# Patient Record
Sex: Male | Born: 1970 | Race: Black or African American | Hispanic: No | Marital: Single | Smoking: Never smoker
Health system: Southern US, Community
[De-identification: ages and names within clinical notes are randomized; demographics above are authoritative.]

## PROBLEM LIST (undated history)

## (undated) DIAGNOSIS — Z789 Other specified health status: Secondary | ICD-10-CM

## (undated) DIAGNOSIS — K219 Gastro-esophageal reflux disease without esophagitis: Secondary | ICD-10-CM

## (undated) DIAGNOSIS — I10 Essential (primary) hypertension: Secondary | ICD-10-CM

## (undated) DIAGNOSIS — I2510 Atherosclerotic heart disease of native coronary artery without angina pectoris: Secondary | ICD-10-CM

## (undated) HISTORY — PX: CIRCUMCISION: SUR203

---

## 2004-09-13 ENCOUNTER — Emergency Department: Payer: Self-pay | Admitting: Emergency Medicine

## 2004-09-20 ENCOUNTER — Emergency Department: Payer: Self-pay | Admitting: Emergency Medicine

## 2004-09-20 ENCOUNTER — Other Ambulatory Visit: Payer: Self-pay

## 2005-05-30 ENCOUNTER — Emergency Department: Payer: Self-pay | Admitting: Emergency Medicine

## 2007-12-18 ENCOUNTER — Emergency Department: Payer: Self-pay | Admitting: Emergency Medicine

## 2009-04-14 ENCOUNTER — Emergency Department: Payer: Self-pay | Admitting: Emergency Medicine

## 2011-07-12 ENCOUNTER — Other Ambulatory Visit: Payer: Self-pay | Admitting: Orthopedic Surgery

## 2011-07-13 ENCOUNTER — Encounter (HOSPITAL_BASED_OUTPATIENT_CLINIC_OR_DEPARTMENT_OTHER): Payer: Self-pay | Admitting: *Deleted

## 2011-07-13 NOTE — Progress Notes (Signed)
No meds No bp or resp problems

## 2011-07-17 ENCOUNTER — Ambulatory Visit (HOSPITAL_BASED_OUTPATIENT_CLINIC_OR_DEPARTMENT_OTHER): Payer: Private Health Insurance - Indemnity | Admitting: Anesthesiology

## 2011-07-17 ENCOUNTER — Encounter (HOSPITAL_BASED_OUTPATIENT_CLINIC_OR_DEPARTMENT_OTHER): Payer: Self-pay | Admitting: Anesthesiology

## 2011-07-17 ENCOUNTER — Ambulatory Visit (HOSPITAL_BASED_OUTPATIENT_CLINIC_OR_DEPARTMENT_OTHER)
Admission: RE | Admit: 2011-07-17 | Discharge: 2011-07-18 | Disposition: A | Discharge status: Home / Self Care | Payer: Private Health Insurance - Indemnity | Source: Ambulatory Visit | Attending: Orthopedic Surgery | Admitting: Orthopedic Surgery

## 2011-07-17 ENCOUNTER — Encounter (HOSPITAL_BASED_OUTPATIENT_CLINIC_OR_DEPARTMENT_OTHER)
Admission: RE | Disposition: A | Discharge status: Home / Self Care | Payer: Self-pay | Source: Ambulatory Visit | Attending: Orthopedic Surgery

## 2011-07-17 ENCOUNTER — Encounter (HOSPITAL_BASED_OUTPATIENT_CLINIC_OR_DEPARTMENT_OTHER): Payer: Self-pay | Admitting: *Deleted

## 2011-07-17 DIAGNOSIS — M719 Bursopathy, unspecified: Secondary | ICD-10-CM | POA: Insufficient documentation

## 2011-07-17 DIAGNOSIS — M7500 Adhesive capsulitis of unspecified shoulder: Secondary | ICD-10-CM | POA: Insufficient documentation

## 2011-07-17 DIAGNOSIS — Z5333 Arthroscopic surgical procedure converted to open procedure: Secondary | ICD-10-CM | POA: Insufficient documentation

## 2011-07-17 DIAGNOSIS — M67919 Unspecified disorder of synovium and tendon, unspecified shoulder: Secondary | ICD-10-CM | POA: Insufficient documentation

## 2011-07-17 HISTORY — DX: Other specified health status: Z78.9

## 2011-07-17 LAB — POCT HEMOGLOBIN-HEMACUE: Hemoglobin: 13.2 g/dL (ref 13.0–17.0)

## 2011-07-17 SURGERY — SHOULDER ARTHROSCOPY WITH ROTATOR CUFF REPAIR AND SUBACROMIAL DECOMPRESSION
Anesthesia: General | Site: Shoulder | Laterality: Right | Wound class: Clean

## 2011-07-17 MED ORDER — DEXAMETHASONE SODIUM PHOSPHATE 4 MG/ML IJ SOLN
INTRAMUSCULAR | Status: DC | PRN
  Administered 2011-07-17: 10 mg via INTRAVENOUS

## 2011-07-17 MED ORDER — PROPOFOL 10 MG/ML IV EMUL
INTRAVENOUS | Status: DC | PRN
  Administered 2011-07-17: 40 mg via INTRAVENOUS
  Administered 2011-07-17: 300 mg via INTRAVENOUS

## 2011-07-17 MED ORDER — HYDROMORPHONE HCL PF 1 MG/ML IJ SOLN
0.5000 mg | INTRAMUSCULAR | Status: DC | PRN
Start: 2011-07-17 — End: 2011-07-18
  Administered 2011-07-18 (×3): 0.5 mg via INTRAVENOUS

## 2011-07-17 MED ORDER — CEFAZOLIN SODIUM-DEXTROSE 2-3 GM-% IV SOLR
2.0000 g | Freq: Four times a day (QID) | INTRAVENOUS | Status: AC
  Administered 2011-07-17 – 2011-07-18 (×3): 2 g via INTRAVENOUS

## 2011-07-17 MED ORDER — SUCCINYLCHOLINE CHLORIDE 20 MG/ML IJ SOLN
INTRAMUSCULAR | Status: DC | PRN
  Administered 2011-07-17: 100 mg via INTRAVENOUS

## 2011-07-17 MED ORDER — ONDANSETRON HCL 4 MG/2ML IJ SOLN: 4.0000 mg | Freq: Four times a day (QID) | INTRAMUSCULAR | Status: DC | PRN

## 2011-07-17 MED ORDER — LACTATED RINGERS IV SOLN
INTRAVENOUS | Status: DC
  Administered 2011-07-17: 17:00:00 via INTRAVENOUS

## 2011-07-17 MED ORDER — PHENYLEPHRINE HCL 10 MG/ML IJ SOLN
10.0000 mg | INTRAMUSCULAR | Status: DC | PRN
  Administered 2011-07-17: 50 ug/min via INTRAVENOUS

## 2011-07-17 MED ORDER — OXYCODONE-ACETAMINOPHEN 5-325 MG PO TABS
1.0000 | ORAL_TABLET | ORAL | Status: DC | PRN
  Administered 2011-07-17 – 2011-07-18 (×3): 2 via ORAL

## 2011-07-17 MED ORDER — METOCLOPRAMIDE HCL 5 MG PO TABS: 5.0000 mg | ORAL_TABLET | Freq: Three times a day (TID) | ORAL | Status: DC | PRN

## 2011-07-17 MED ORDER — CEPHALEXIN 500 MG PO CAPS: 500.0000 mg | ORAL_CAPSULE | Freq: Three times a day (TID) | ORAL | Status: AC

## 2011-07-17 MED ORDER — FENTANYL CITRATE 0.05 MG/ML IJ SOLN
50.0000 ug | INTRAMUSCULAR | Status: DC | PRN
  Administered 2011-07-17: 100 ug via INTRAVENOUS

## 2011-07-17 MED ORDER — IBUPROFEN 600 MG PO TABS: 600.0000 mg | ORAL_TABLET | Freq: Four times a day (QID) | ORAL | Status: AC | PRN

## 2011-07-17 MED ORDER — METOCLOPRAMIDE HCL 5 MG/ML IJ SOLN: 10.0000 mg | Freq: Once | INTRAMUSCULAR | Status: AC | PRN

## 2011-07-17 MED ORDER — CHLORHEXIDINE GLUCONATE 4 % EX LIQD: 60.0000 mL | Freq: Once | CUTANEOUS | Status: DC

## 2011-07-17 MED ORDER — CEFAZOLIN SODIUM-DEXTROSE 2-3 GM-% IV SOLR
2.0000 g | Freq: Once | INTRAVENOUS | Status: AC
  Administered 2011-07-17: 2 g via INTRAVENOUS

## 2011-07-17 MED ORDER — ROPIVACAINE HCL 5 MG/ML IJ SOLN
INTRAMUSCULAR | Status: DC | PRN
  Administered 2011-07-17: 20 mL via EPIDURAL

## 2011-07-17 MED ORDER — FENTANYL CITRATE 0.05 MG/ML IJ SOLN: 25.0000 ug | INTRAMUSCULAR | Status: DC | PRN

## 2011-07-17 MED ORDER — SODIUM CHLORIDE 0.9 % IR SOLN
Status: DC | PRN
  Administered 2011-07-17: 6000 mL

## 2011-07-17 MED ORDER — LIDOCAINE HCL (CARDIAC) 20 MG/ML IV SOLN
INTRAVENOUS | Status: DC | PRN
  Administered 2011-07-17: 50 mg via INTRAVENOUS

## 2011-07-17 MED ORDER — LACTATED RINGERS IV SOLN
INTRAVENOUS | Status: DC
  Administered 2011-07-17: 13:00:00 via INTRAVENOUS

## 2011-07-17 MED ORDER — MIDAZOLAM HCL 2 MG/2ML IJ SOLN
0.5000 mg | INTRAMUSCULAR | Status: DC | PRN
  Administered 2011-07-17 (×2): 2 mg via INTRAVENOUS

## 2011-07-17 MED ORDER — LIDOCAINE HCL 1 % IJ SOLN
INTRAMUSCULAR | Status: DC | PRN
  Administered 2011-07-17: 2 mL via INTRADERMAL

## 2011-07-17 MED ORDER — HYDROMORPHONE HCL 2 MG PO TABS: ORAL_TABLET | ORAL | Status: AC

## 2011-07-17 MED ORDER — ONDANSETRON HCL 4 MG PO TABS: 4.0000 mg | ORAL_TABLET | Freq: Four times a day (QID) | ORAL | Status: DC | PRN

## 2011-07-17 SURGICAL SUPPLY — 80 items
ANCHOR BIO SWLOCK 4.75 W/TIG (Anchor) ×4 IMPLANT
ANCHOR SUT BIO SW 4.75 W/FIB (Anchor) ×2 IMPLANT
ANCHOR SUT BIO SW 4.75X19.1 (Anchor) ×2 IMPLANT
BANDAGE ADHESIVE 1X3 (GAUZE/BANDAGES/DRESSINGS) IMPLANT
BLADE AVERAGE 25X9 (BLADE) IMPLANT
BLADE CUTTER MENIS 5.5 (BLADE) ×2 IMPLANT
BLADE SURG 15 STRL LF DISP TIS (BLADE) ×2 IMPLANT
BLADE SURG 15 STRL SS (BLADE) ×2
BUR EGG 3PK/BX (BURR) ×2 IMPLANT
BUR OVAL 6.0 (BURR) ×2 IMPLANT
CANISTER OMNI JUG 16 LITER (MISCELLANEOUS) ×2 IMPLANT
CANISTER SUCTION 2500CC (MISCELLANEOUS) ×2 IMPLANT
CANNULA 5.75X7 CRYSTAL CLEAR (CANNULA) IMPLANT
CANNULA TWIST IN 8.25X7CM (CANNULA) IMPLANT
CLEANER CAUTERY TIP 5X5 PAD (MISCELLANEOUS) IMPLANT
CLOTH BEACON ORANGE TIMEOUT ST (SAFETY) IMPLANT
CUTTER MENISCUS  4.2MM (BLADE) ×1
CUTTER MENISCUS 4.2MM (BLADE) ×1 IMPLANT
DECANTER SPIKE VIAL GLASS SM (MISCELLANEOUS) IMPLANT
DRAPE INCISE IOBAN 66X45 STRL (DRAPES) ×2 IMPLANT
DRAPE STERI 35X30 U-POUCH (DRAPES) ×2 IMPLANT
DRAPE SURG 17X23 STRL (DRAPES) ×2 IMPLANT
DRAPE U-SHAPE 47X51 STRL (DRAPES) ×2 IMPLANT
DRAPE U-SHAPE 76X120 STRL (DRAPES) ×4 IMPLANT
DRSG PAD ABDOMINAL 8X10 ST (GAUZE/BANDAGES/DRESSINGS) ×2 IMPLANT
DURAPREP 26ML APPLICATOR (WOUND CARE) ×2 IMPLANT
ELECT REM PT RETURN 9FT ADLT (ELECTROSURGICAL) ×2
ELECTRODE REM PT RTRN 9FT ADLT (ELECTROSURGICAL) ×1 IMPLANT
GLOVE BIO SURGEON STRL SZ 6.5 (GLOVE) ×4 IMPLANT
GLOVE BIOGEL M STRL SZ7.5 (GLOVE) ×2 IMPLANT
GLOVE BIOGEL PI IND STRL 7.0 (GLOVE) ×2 IMPLANT
GLOVE BIOGEL PI IND STRL 8 (GLOVE) ×2 IMPLANT
GLOVE BIOGEL PI INDICATOR 7.0 (GLOVE) ×2
GLOVE BIOGEL PI INDICATOR 8 (GLOVE) ×2
GLOVE ORTHO TXT STRL SZ7.5 (GLOVE) ×2 IMPLANT
GOWN BRE IMP PREV XXLGXLNG (GOWN DISPOSABLE) ×4 IMPLANT
GOWN STRL REIN 2XL XLG LVL4 (GOWN DISPOSABLE) IMPLANT
NDL SUT 6 .5 CRC .975X.05 MAYO (NEEDLE) IMPLANT
NEEDLE MAYO TAPER (NEEDLE)
NEEDLE MINI RC 24MM (NEEDLE) IMPLANT
NEEDLE SCORPION (NEEDLE) ×2 IMPLANT
PACK ARTHROSCOPY DSU (CUSTOM PROCEDURE TRAY) ×2 IMPLANT
PACK BASIN DAY SURGERY FS (CUSTOM PROCEDURE TRAY) ×2 IMPLANT
PAD CLEANER CAUTERY TIP 5X5 (MISCELLANEOUS)
PASSER SUT SWANSON 36MM LOOP (INSTRUMENTS) IMPLANT
PENCIL BUTTON HOLSTER BLD 10FT (ELECTRODE) ×2 IMPLANT
SLEEVE SCD COMPRESS KNEE MED (MISCELLANEOUS) ×2 IMPLANT
SLING ARM FOAM STRAP LRG (SOFTGOODS) IMPLANT
SLING ARM FOAM STRAP MED (SOFTGOODS) IMPLANT
SLING ARM FOAM STRAP XLG (SOFTGOODS) ×2 IMPLANT
SPONGE GAUZE 4X4 12PLY (GAUZE/BANDAGES/DRESSINGS) ×2 IMPLANT
SPONGE LAP 4X18 X RAY DECT (DISPOSABLE) ×2 IMPLANT
STRIP CLOSURE SKIN 1/2X4 (GAUZE/BANDAGES/DRESSINGS) ×2 IMPLANT
SUCTION FRAZIER TIP 10 FR DISP (SUCTIONS) IMPLANT
SUT ETHIBOND 2 OS 4 DA (SUTURE) IMPLANT
SUT ETHILON 4 0 PS 2 18 (SUTURE) IMPLANT
SUT FIBERWIRE #2 38 T-5 BLUE (SUTURE)
SUT FIBERWIRE 3-0 18 TAPR NDL (SUTURE)
SUT PROLENE 1 CT (SUTURE) IMPLANT
SUT PROLENE 3 0 PS 2 (SUTURE) ×2 IMPLANT
SUT TIGER TAPE 7 IN WHITE (SUTURE) IMPLANT
SUT VIC AB 0 CT1 27 (SUTURE) ×1
SUT VIC AB 0 CT1 27XBRD ANBCTR (SUTURE) ×1 IMPLANT
SUT VIC AB 0 SH 27 (SUTURE) IMPLANT
SUT VIC AB 2-0 SH 27 (SUTURE) ×1
SUT VIC AB 2-0 SH 27XBRD (SUTURE) ×1 IMPLANT
SUT VIC AB 3-0 SH 27 (SUTURE)
SUT VIC AB 3-0 SH 27X BRD (SUTURE) IMPLANT
SUT VIC AB 3-0 X1 27 (SUTURE) IMPLANT
SUTURE FIBERWR #2 38 T-5 BLUE (SUTURE) IMPLANT
SUTURE FIBERWR 3-0 18 TAPR NDL (SUTURE) IMPLANT
SYR 3ML 23GX1 SAFETY (SYRINGE) IMPLANT
SYR BULB 3OZ (MISCELLANEOUS) IMPLANT
TAPE FIBER 2MM 7IN #2 BLUE (SUTURE) ×2 IMPLANT
TAPE PAPER 3X10 WHT MICROPORE (GAUZE/BANDAGES/DRESSINGS) ×2 IMPLANT
TOWEL OR 17X24 6PK STRL BLUE (TOWEL DISPOSABLE) ×2 IMPLANT
TUBE CONNECTING 20X1/4 (TUBING) ×4 IMPLANT
WAND STAR VAC 90 (SURGICAL WAND) ×2 IMPLANT
WATER STERILE IRR 1000ML POUR (IV SOLUTION) ×2 IMPLANT
YANKAUER SUCT BULB TIP NO VENT (SUCTIONS) IMPLANT

## 2011-07-17 NOTE — H&P (Addendum)
  Alexander Simon is an 41 y.o. male.   Chief Complaint: c/o chronic and progressive right shoulder pain HPI: Patient is a 41 year old right-hand-dominant male who we have followed for the past 7-8 months with a history of progressive right shoulder pain. On initial evaluation at our office It was shown that he had quite a bit of adhesive capsulitis. He went through extensive physical therapy and cortisone injection without relief. 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 with signs of adhesive capsulitis. Due to the fact that he has not progressed with physical therapy was decided to take him to the operating room to undergo shoulder arthroscopy with decompression, distal clavicle excision and repair of the rotator cuff..  Past Medical History  Diagnosis Date  . No pertinent past medical history     Past Surgical History  Procedure Date  . Circumcision     No family history on file. Social History:  reports that he has never smoked. He does not have any smokeless tobacco history on file. He reports that he does not drink alcohol or use illicit drugs.  Allergies: No Known Allergies  No prescriptions prior to admission    No results found for this or any previous visit (from the past 48 hour(s)).  No results found.   Pertinent items are noted in HPI.  Height 5\' 11"  (1.803 m), weight 113.399 kg (250 lb).  General appearance: alert Head: Normocephalic, without obvious abnormality Neck: supple, symmetrical, trachea midline Resp: clear to auscultation bilaterally Cardio: regular rate and rhythm GI: normal findings: bowel sounds normal Extremities: Examination of the right shoulder reveals pain in the all planes with only approximately 140 of forward flexion and abduction and 45-50 of external rotation. He has positive impingement sign.  The MRI was obtained at [facility] in [place] and interpreted by Colon Branch, radiologist. The MRI was also reviewed in detail on our PACS system in the office. Alexander Simon has a cyst in the superior glenoid. This may represent an early osteoarthritis of the glenohumeral joint or may be an isolated simple cyst. He has a hyperintense T2 signal at the insertion of the supraspinatus consistent with a full thickness rotator cuff tear. He has unfavorable AC anatomy and prominent distal clavicle.   Pulses: 2+ and symmetric Skin: Skin color, texture, turgor normal. 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 That has not resolved with conservative  treatment  Plan:To the OR for right shoulder arthroscopy with SAD/DCR and repair vs. debridement of the rotator cuff. The procedure, risks and post-op course were discussed with the patient at length and he was in agreement with the plan.  We are going to carefully assess the integrity of the hyaline cartilage of the glenohumeral joint.  DASNOIT,Kol Consuegra J 07/17/2011, 8:15 AM    H&P documentation: 07/17/2011  -History and Physical Reviewed  -Patient has been re-examined  -No change in the plan of care  Wyn Forster, MD    H&P documentation: 07/17/2011  -History and Physical Reviewed  -Patient has been re-examined  -No change in the plan of care  Wyn Forster, MD

## 2011-07-17 NOTE — Progress Notes (Signed)
Assisted Dr. Frederick with right, ultrasound guided, interscalene  block. Side rails up, monitors on throughout procedure. See vital signs in flow sheet. Tolerated Procedure well. 

## 2011-07-17 NOTE — Op Note (Signed)
Op note dictated  [id number]

## 2011-07-17 NOTE — Transfer of Care (Signed)
Immediate Anesthesia Transfer of Care Note  Patient: Alexander Simon  Procedure(s) Performed: Procedure(s) (LRB): SHOULDER ARTHROSCOPY WITH ROTATOR CUFF REPAIR AND SUBACROMIAL DECOMPRESSION (Right)  Patient Location: PACU  Anesthesia Type: General  Level of Consciousness: awake  Airway & Oxygen Therapy: Patient Spontanous Breathing and Patient connected to face mask oxygen  Post-op Assessment: Report given to PACU RN and Post -op Vital signs reviewed and stable  Post vital signs: Reviewed and stable  Complications: No apparent anesthesia complications

## 2011-07-17 NOTE — Brief Op Note (Signed)
07/17/2011  3:30 PM  PATIENT:  Alexander Simon  41 y.o. male  PRE-OPERATIVE DIAGNOSIS:  MRI documented ROTATOR CUFF TEAR RIGHT  POST-OPERATIVE DIAGNOSIS: MRI documented ROTATOR CUFF TEAR RIGHT, AC arthritis   PROCEDURE:  Procedure(s) (LRB): SHOULDER ARTHROSCOPY WITH ROTATOR CUFF REPAIR AND SUBACROMIAL DECOMPRESSION, chronic bursitis  SURGEON: Wyn Forster., MD   PHYSICIAN ASSISTANT:   ASSISTANTS: Mallory Shirk.A-C   ANESTHESIA:   general  EBL:  Total I/O In: 1000 [I.V.:1000] Out: -   BLOOD ADMINISTERED:none  DRAINS: none   LOCAL MEDICATIONS USED: ropivacaine plexus block  SPECIMEN:  No Specimen  DISPOSITION OF SPECIMEN:  N/A  COUNTS:  YES  TOURNIQUET:  * No tourniquets in log *  DICTATION: .Other Dictation: Dictation Number [id number]  PLAN OF CARE: Admit for overnight observation  PATIENT DISPOSITION:  PACU - hemodynamically stable.

## 2011-07-17 NOTE — Anesthesia Preprocedure Evaluation (Signed)
Anesthesia Evaluation  Patient identified by MRN, date of birth, ID band Patient awake    Reviewed: Allergy & Precautions, H&P , NPO status , Patient's Chart, lab work & pertinent test results, reviewed documented beta blocker date and time   Airway Mallampati: II TM Distance: >3 FB Neck ROM: full    Dental   Pulmonary neg pulmonary ROS,          Cardiovascular negative cardio ROS      Neuro/Psych negative neurological ROS  negative psych ROS   GI/Hepatic negative GI ROS, Neg liver ROS,   Endo/Other  negative endocrine ROS  Renal/GU negative Renal ROS  negative genitourinary   Musculoskeletal   Abdominal   Peds  Hematology negative hematology ROS (+)   Anesthesia Other Findings See surgeon's H&P   Reproductive/Obstetrics negative OB ROS                           Anesthesia Physical Anesthesia Plan  ASA: II  Anesthesia Plan: General   Post-op Pain Management:    Induction: Intravenous  Airway Management Planned: Oral ETT  Additional Equipment:   Intra-op Plan:   Post-operative Plan: Extubation in OR  Informed Consent: I have reviewed the patients History and Physical, chart, labs and discussed the procedure including the risks, benefits and alternatives for the proposed anesthesia with the patient or authorized representative who has indicated his/her understanding and acceptance.   Dental Advisory Given  Plan Discussed with: CRNA and Surgeon  Anesthesia Plan Comments:        Anesthesia Quick Evaluation  

## 2011-07-17 NOTE — Anesthesia Procedure Notes (Addendum)
Anesthesia Regional Block:  Interscalene brachial plexus block  Pre-Anesthetic Checklist: ,, timeout performed, Correct Patient, Correct Site, Correct Laterality, Correct Procedure, Correct Position, site marked, Risks and benefits discussed,  Surgical consent,  Pre-op evaluation,  At surgeon's request and post-op pain management  Laterality: Right  Prep: chloraprep       Needles:   Needle Type: Other   (Arrow Echogenic)   Needle Length: 9cm  Needle Gauge: 21    Additional Needles:  Procedures: ultrasound guided Interscalene brachial plexus block Narrative:  Start time: 07/17/2011 12:40 PM End time: 07/17/2011 12:51 PM Injection made incrementally with aspirations every 5 mL.  Performed by: Personally  Anesthesiologist: Aldona Lento, MD  Additional Notes: Ultrasound guidance used to: id relevant anatomy, confirm needle position, local anesthetic spread, avoidance of vascular puncture. Picture saved. No complications. Block performed personally by Janetta Hora. Gelene Mink, MD    Interscalene brachial plexus block Procedure Name: Intubation Performed by: York Grice Pre-anesthesia Checklist: Patient identified, Timeout performed, Emergency Drugs available, Suction available and Patient being monitored Patient Re-evaluated:Patient Re-evaluated prior to inductionOxygen Delivery Method: Circle system utilized Preoxygenation: Pre-oxygenation with 100% oxygen Intubation Type: IV induction and Inhalational induction with existing ETT Ventilation: Mask ventilation without difficulty Laryngoscope Size: Miller and 2 Grade View: Grade II Tube type: Oral Number of attempts: 1 Placement Confirmation: ETT inserted through vocal cords under direct vision,  breath sounds checked- equal and bilateral and positive ETCO2 Secured at: 23 cm Tube secured with: Tape Dental Injury: Teeth and Oropharynx as per pre-operative assessment

## 2011-07-17 NOTE — Anesthesia Postprocedure Evaluation (Signed)
Anesthesia Post Note  Patient: Alexander Simon  Procedure(s) Performed: Procedure(s) (LRB): SHOULDER ARTHROSCOPY WITH ROTATOR CUFF REPAIR AND SUBACROMIAL DECOMPRESSION (Right)  Anesthesia type: General  Patient location: PACU  Post pain: Pain level controlled  Post assessment: Patient's Cardiovascular Status Stable  Last Vitals:  Filed Vitals:   07/17/11 1645  BP: 108/57  Pulse: 106  Temp:   Resp: 24    Post vital signs: Reviewed and stable  Level of consciousness: alert  Complications: No apparent anesthesia complications

## 2011-07-18 MED ORDER — TEMAZEPAM 15 MG PO CAPS
15.0000 mg | ORAL_CAPSULE | Freq: Once | ORAL | Status: AC
  Administered 2011-07-18: 15 mg via ORAL

## 2011-07-18 NOTE — Discharge Instructions (Signed)
Hand Center Instructions °Hand Surgery ° °Wound Care: °Keep your hand elevated above the level of your heart.  Do not allow it to dangle by your side.  Keep the dressing dry and do not remove it unless your doctor advises you to do so.  He will usually change it at the time of your post-op visit.  Moving your fingers is advised to stimulate circulation but will depend on the site of your surgery.  If you have a splint applied, your doctor will advise you regarding movement. ° °Activity: °Do not drive or operate machinery today.  Rest today and then you may return to your normal activity and work as indicated by your physician. ° °Diet:  °Drink liquids today or eat a light diet.  You may resume a regular diet tomorrow.   ° °General expectations: °Pain for two to three days. °Fingers may become slightly swollen. ° °Call your doctor if any of the following occur: °Severe pain not relieved by pain medication. °Elevated temperature. °Dressing soaked with blood. °Inability to move fingers. °White or bluish color to fingers. ° °Regional Anesthesia Blocks ° °1. Numbness or the inability to move the "blocked" extremity may last from 3-48 hours after placement. The length of time depends on the medication injected and your individual response to the medication. If the numbness is not going away after 48 hours, call your surgeon. ° °2. The extremity that is blocked will need to be protected until the numbness is gone and the  Strength has returned. Because you cannot feel it, you will need to take extra care to avoid injury. Because it may be weak, you may have difficulty moving it or using it. You may not know what position it is in without looking at it while the block is in effect. ° °3. For blocks in the legs and feet, returning to weight bearing and walking needs to be done carefully. You will need to wait until the numbness is entirely gone and the strength has returned. You should be able to move your leg and foot  normally before you try and bear weight or walk. You will need someone to be with you when you first try to ensure you do not fall and possibly risk injury. ° °4. Bruising and tenderness at the needle site are common side effects and will resolve in a few days. ° °5. Persistent numbness or new problems with movement should be communicated to the surgeon or the [facility] ([phone])/ [facility] ([phone]). ° °

## 2011-07-18 NOTE — Op Note (Signed)
NAMEDEMAREA, LOREY NO.:  [id number]  MEDICAL RECORD NO.:  [id number]  LOCATION:                                 FACILITY:  PHYSICIAN:  Katy Fitch. Trygve Thal, M.D.      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  OPERATION: 1. Diagnostic arthroscopy, right glenohumeral joint documenting full-     thickness rotator cuff tear. 2. Subacromial decompression with bursectomy, coracoacromial ligament     release, and acromioplasty. 3. Arthroscopic distal clavicle resection. 4. Open hybrid reconstruction of rotator cuff to decorticated greater     tuberosity utilizing a Diamondback technique with medial suture     bridge.  OPERATING SURGEON:  Katy Fitch. Rayonna Heldman, MD  ASSISTANT:  Marveen Reeks Dasnoit, PA-C  ANESTHESIA:  General endotracheal supplemented by a right plexus block.  SUPERVISING ANESTHESIOLOGIST:  Janetta Hora. Gelene Mink, MD  INDICATIONS:  Pape Parson is a 41 year old gentleman employed by [employer] in [place], [place].  I have followed him for more than 6 months for multiple problems affecting his right upper extremity including shoulder pain, shoulder stiffness, weakness of arms, flexion and scaption, and numbness of his right hand.  During his initial visit at our office, we identified a stiff right shoulder consistent with adhesive capsulitis and on plain films, he  was noted to have some degenerative changes of his [facility] joint with a medial acromial osteophyte.  He also was noted to have clinically significant carpal tunnel syndrome with positive electrodiagnostic studies.  His carpal tunnel syndrome has responded well to injection and splinting.  His shoulder has been treated with supervised therapy for several months.  He has recovered full range of motion, but still had significant pain.  Therefore, he was sent for an MRI.  The MRI documented a full-thickness rotator cuff tear involving the entire supraspinatus tendon.  His long head of biceps was stable at the superior glenoid origin and was normal through the rotator interval. The subscapularis was slightly thickened, but otherwise free of signs of rotator cuff tear.  We advised Mr. Grunewald to proceed with reconstruction of his right shoulder at this time.  PROCEDURE:  Colbe Viviano was brought to room #2 of the [facility] and placed in supine position on the operating table.  Following detailed anesthesia informed consent by Dr. Gelene Mink, a right ropivacaine plexus block was placed with ultrasound guidance without complication.  This block led excellent anesthesia of the right upper extremity and forequarter.  In room #2 under Dr. Thornton Dales direct supervision, general endotracheal anesthesia was induced followed by careful positioning in the beach-chair position with aid of a torso and head holder designed for shoulder arthroscopy.  Examination of shoulder under anesthesia revealed no  sign of residual adhesive capsulitis.  The shoulder was able to elevate to 175 degrees, externally rotate to 95 degrees with 90 degrees of abduction.  We could extend the shoulder beyond the interscapular plane and internally rotate to 70 degrees.  The entire right upper extremity and forequarter were then prepped with DuraPrep and draped with impervious arthroscopy drapes.  Following routine  surgical time-out, the scope was introduced through a standard posterior viewing portal with blunt technique.  Diagnostic arthroscopy confirmed intact hyaline articular cartilage surfaces on the glenoid and humeral head.  The labrum had minor degenerative changes, was debrided with a suction shaver brought in anteriorly.  It is a full-thickness tear of the supraspinous rotator cuff tendon and significant undermining of the infraspinatus.  The tear was documented with digital camera.  The tissues surrounding the subscapularis were carefully debrided and no sign of subscapular tear was noted.  The biceps origin was stable and the biceps tendon was normal through the rotator interval.  The scope was removed from the glenohumeral joint and placed in subacromial space.  A thorough bursectomy was accomplished followed by leveling the acromion to a type 1 morphology with removal of medial and anterior osteophytes.  The distal clavicle was isolated, cleared of soft tissues with electrocautery and the distal centimeter of clavicle resected with the arthroscopic bur.  Documentation of the decompression was accomplished with a digital camera.  We then documented the rotator cuff tear from the bursal side.  We removed the arthroscopic equipment, proceeded to perform a double Diamondback rotator cuff reconstruction utilizing Arthrex Bio-Corkscrew anchors medially and laterally in the manner of swivel locks to create an anatomic footprint repair of the supraspinatus and the anterior half of the infraspinatus.  Very satisfactory footprint was achieved with a medial compression suture.  The scope was replaced in glenohumeral joint documenting an anatomic insertion of the cuff at the articular margin. There was no evidence of falling of the long head of the biceps.  After irrigation of the subacromial space and the joint, the wound was repaired with interrupted suture of 0 Vicryl repairing the deltoid  split followed by repair of the skin and subcutaneous with 3-0 Vicryl and intradermal 3-0 Prolene.  For aftercare, Mr. Persichetti was placed in a sling and awakened from general anesthesia.  He was transferred to the recovery room with stable signs.  He will be admitted to recovery care center for observation of his vital signs and p.o. and IV medications for pain, and Ancef 1 g IV q.6 hours x3 doses.  For aftercare, we anticipate discharge in the morning.  He will begin outpatient rehabilitation in 3 days.     Katy Fitch Kwabena Strutz, M.D.     RVS/MEDQ  D:  07/17/2011  T:  07/18/2011  Job:  [id number]

## 2012-02-27 HISTORY — PX: SHOULDER SURGERY: SHX246

## 2012-09-18 ENCOUNTER — Emergency Department: Payer: Self-pay | Admitting: Emergency Medicine

## 2012-09-19 LAB — CBC
HCT: 40.6 % (ref 40.0–52.0)
MCH: 26.3 pg (ref 26.0–34.0)
MCHC: 33.3 g/dL (ref 32.0–36.0)
MCV: 79 fL — ABNORMAL LOW (ref 80–100)
Platelet: 229 10*3/uL (ref 150–440)
WBC: 9.1 10*3/uL (ref 3.8–10.6)

## 2012-09-19 LAB — BASIC METABOLIC PANEL
Anion Gap: 3 — ABNORMAL LOW (ref 7–16)
Chloride: 105 mmol/L (ref 98–107)
Co2: 30 mmol/L (ref 21–32)
Creatinine: 1 mg/dL (ref 0.60–1.30)
EGFR (African American): >60
EGFR (Non-African Amer.): >60

## 2012-10-05 ENCOUNTER — Emergency Department: Payer: Self-pay | Admitting: Emergency Medicine

## 2013-10-11 ENCOUNTER — Ambulatory Visit: Payer: Self-pay

## 2014-06-22 ENCOUNTER — Encounter: Payer: Self-pay | Admitting: *Deleted

## 2014-06-28 ENCOUNTER — Other Ambulatory Visit: Payer: Managed Care, Other (non HMO)

## 2014-06-28 ENCOUNTER — Encounter: Payer: Self-pay | Admitting: General Surgery

## 2014-06-28 ENCOUNTER — Ambulatory Visit (INDEPENDENT_AMBULATORY_CARE_PROVIDER_SITE_OTHER): Payer: Managed Care, Other (non HMO) | Admitting: General Surgery

## 2014-06-28 VITALS — BP 148/78 | HR 78 | Resp 14 | Ht 72.0 in | Wt 264.0 lb

## 2014-06-28 DIAGNOSIS — K118 Other diseases of salivary glands: Secondary | ICD-10-CM

## 2014-06-28 DIAGNOSIS — R220 Localized swelling, mass and lump, head: Secondary | ICD-10-CM

## 2014-06-28 DIAGNOSIS — R221 Localized swelling, mass and lump, neck: Secondary | ICD-10-CM | POA: Diagnosis not present

## 2014-06-28 NOTE — Progress Notes (Signed)
Patient ID: Alexander Simon, male   DOB: Aug 29, 1970, 44 y.o.   MRN: [id number]  Chief Complaint  Patient presents with  . Other    cyst on neck    HPI Alexander Simon is a 44 y.o. male here today for a evalution of a neck cyst. Patient states he noticed this area about 3 years ago. It has recently started bothering him a  little. He reports no change in size  HPI  Past Medical History  Diagnosis Date  . No pertinent past medical history     Past Surgical History  Procedure Laterality Date  . Circumcision    . Shoulder surgery  2014    Family History  Problem Relation Age of Onset  . Breast cancer Mother   . Lung cancer Mother   . Cancer Maternal Uncle     Social History History  Substance Use Topics  . Smoking status: Never Smoker   . Smokeless tobacco: Never Used  . Alcohol Use: No    No Known Allergies  Current Outpatient Prescriptions  Medication Sig Dispense Refill  . ibuprofen (ADVIL,MOTRIN) 200 MG tablet Take 200 mg by mouth every 6 (six) hours as needed.    . sildenafil (VIAGRA) 25 MG tablet Take 25 mg by mouth as needed for erectile dysfunction.     No current facility-administered medications for this visit.    Review of Systems Review of Systems  Constitutional: Negative.   Respiratory: Negative.   Cardiovascular: Negative.     Blood pressure 148/78, pulse 78, resp. rate 14, height 6' (1.829 m), weight 264 lb (119.75 kg).  Physical Exam Physical Exam  Constitutional: He is oriented to person, place, and time. He appears well-developed and well-nourished.  Eyes: Conjunctivae are normal. No scleral icterus.  Neck: Neck supple.    Cardiovascular: Normal rate, regular rhythm and normal heart sounds.   Pulmonary/Chest: Effort normal and breath sounds normal.  Lymphadenopathy:    He has no cervical adenopathy.  Neurological: He is alert and oriented to person, place, and time.  Skin: Skin is warm and dry.    Data Reviewed  targeted US showed a  mildly hyperechoic masss about 3by 2cm, with margins that are somewhat indistinct.   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     Plan    With consent FNA was done with US guidance. Once  a report is available pt will be advised.    Ref/PCP: Dr Chrisandra Carota 06/29/2014, 5:51 AM

## 2014-06-29 ENCOUNTER — Encounter: Payer: Self-pay | Admitting: General Surgery

## 2014-06-30 LAB — FINE-NEEDLE ASPIRATION

## 2014-07-08 ENCOUNTER — Telehealth: Payer: Self-pay | Admitting: *Deleted

## 2014-07-08 NOTE — Telephone Encounter (Signed)
Patient called the office back today to schedule office biopsy. This has been arranged for next Wednesday, 07-14-14.

## 2014-07-08 NOTE — Telephone Encounter (Signed)
-----   Message from Christene Lye, MD sent at 07/02/2014 10:27 AM EDT ----- Pt needs a core biopsy. Schedule this for next week. Left message on pt's voicemail.

## 2014-07-14 ENCOUNTER — Other Ambulatory Visit: Payer: Managed Care, Other (non HMO)

## 2014-07-14 ENCOUNTER — Ambulatory Visit (INDEPENDENT_AMBULATORY_CARE_PROVIDER_SITE_OTHER): Payer: Managed Care, Other (non HMO) | Admitting: General Surgery

## 2014-07-14 ENCOUNTER — Encounter: Payer: Self-pay | Admitting: General Surgery

## 2014-07-14 VITALS — BP 140/72 | HR 78 | Resp 13 | Ht 72.0 in | Wt 264.0 lb

## 2014-07-14 DIAGNOSIS — R221 Localized swelling, mass and lump, neck: Secondary | ICD-10-CM | POA: Diagnosis not present

## 2014-07-14 NOTE — Patient Instructions (Signed)
You may shower. You can remove the top waterproof dressing in two days.

## 2014-07-14 NOTE — Progress Notes (Signed)
Patient ID: Alexander Simon, male   DOB: [birth year], 44 y.o.   MRN: [id number] Patient here for scheduled core biopsy of right parotid gland mass. FNA was insufficient. Core biopsy completed with US guidance using a 20g core needle. F/U based on pathology  Ref/PCP: Dr Brynda Greathouse

## 2014-07-22 ENCOUNTER — Telehealth: Payer: Self-pay | Admitting: *Deleted

## 2014-07-22 NOTE — Telephone Encounter (Signed)
Message for patient to call the office.   Patient needs to be scheduled for surgery at his convenience. If patient's surgery is 30 days from last history and physical, patient will need to see Dr. Jamal Collin for a pre-op visit prior to phone interview.

## 2014-08-11 ENCOUNTER — Telehealth: Payer: Self-pay | Admitting: *Deleted

## 2014-08-11 NOTE — Telephone Encounter (Signed)
Another message has been left for patient to call the office.   We need to schedule patient's surgery at his convenience.

## 2014-08-26 ENCOUNTER — Encounter: Payer: Self-pay | Admitting: General Surgery

## 2014-08-31 ENCOUNTER — Telehealth: Payer: Self-pay

## 2014-08-31 NOTE — Telephone Encounter (Signed)
Patient called to reschedule his surgery scheduled for 09/10/14. He is unable to get off from his work. Patient is rescheduled for surgery at [facility] on 09/29/14. He will be seen here on 09/07/14 at 4:45 pm for a pre op visit. Patient is aware of date and instructions.

## 2014-09-06 ENCOUNTER — Telehealth: Payer: Self-pay | Admitting: *Deleted

## 2014-09-06 NOTE — Telephone Encounter (Signed)
Called patient and left message to call the office back to let us know about his appointment on 09/07/14. He was scheduled for a pre-op on 09/07/14 at 4:45pm but he canceled through the automated reminder system. I was just wanting to know if he meant to cancel his appointment and needed to r/s or was he still coming.

## 2014-09-07 ENCOUNTER — Ambulatory Visit: Payer: Managed Care, Other (non HMO) | Admitting: General Surgery

## 2014-09-08 ENCOUNTER — Encounter: Payer: Self-pay | Admitting: *Deleted

## 2014-09-08 ENCOUNTER — Inpatient Hospital Stay: Admission: RE | Admit: 2014-09-08 | Payer: Private Health Insurance - Indemnity | Source: Ambulatory Visit

## 2014-09-15 ENCOUNTER — Telehealth: Payer: Self-pay | Admitting: *Deleted

## 2014-09-15 NOTE — Telephone Encounter (Signed)
Another attempt was made to contact patient today with no response.   Message left on cell number. No answer and not able to leave a message on work number.

## 2014-09-20 ENCOUNTER — Telehealth: Payer: Self-pay | Admitting: *Deleted

## 2014-09-20 NOTE — Telephone Encounter (Signed)
Patient cancelled surgery that was scheduled at [facility] for 09-29-14. This patient states he would like to wait till later in the year when he can get enough time off work to recover.   Leah in the [employer] has been notified of cancellation.   Patient aware to call the office when he would like to proceed with surgery. He will require a pre-op visit at that time.

## 2014-09-29 ENCOUNTER — Ambulatory Visit
Admission: RE | Admit: 2014-09-29 | Payer: Managed Care, Other (non HMO) | Source: Ambulatory Visit | Admitting: General Surgery

## 2014-09-29 ENCOUNTER — Encounter: Admission: RE | Payer: Self-pay | Source: Ambulatory Visit

## 2014-09-29 SURGERY — EXCISION MASS
Anesthesia: Choice | Laterality: Right

## 2014-10-21 ENCOUNTER — Encounter: Payer: Self-pay | Admitting: *Deleted

## 2015-02-12 ENCOUNTER — Ambulatory Visit
Admission: EM | Admit: 2015-02-12 | Discharge: 2015-02-12 | Disposition: A | Discharge status: Home / Self Care | Payer: Managed Care, Other (non HMO) | Attending: Internal Medicine | Admitting: Internal Medicine

## 2015-02-12 DIAGNOSIS — H6123 Impacted cerumen, bilateral: Secondary | ICD-10-CM

## 2015-02-12 DIAGNOSIS — J069 Acute upper respiratory infection, unspecified: Secondary | ICD-10-CM

## 2015-02-12 MED ORDER — ALBUTEROL SULFATE HFA 108 (90 BASE) MCG/ACT IN AERS: 1.0000 | INHALATION_SPRAY | Freq: Four times a day (QID) | RESPIRATORY_TRACT | Status: DC | PRN

## 2015-02-12 MED ORDER — HYDROCOD POLST-CPM POLST ER 10-8 MG/5ML PO SUER: 5.0000 mL | Freq: Once | ORAL | Status: DC

## 2015-02-12 MED ORDER — BENZONATATE 100 MG PO CAPS: 100.0000 mg | ORAL_CAPSULE | Freq: Three times a day (TID) | ORAL | Status: DC | PRN

## 2015-02-12 MED ORDER — AZITHROMYCIN 250 MG PO TABS: 250.0000 mg | ORAL_TABLET | Freq: Every day | ORAL | Status: DC

## 2015-02-12 MED ORDER — IPRATROPIUM-ALBUTEROL 0.5-2.5 (3) MG/3ML IN SOLN
3.0000 mL | Freq: Once | RESPIRATORY_TRACT | Status: AC
  Administered 2015-02-12: 3 mL via RESPIRATORY_TRACT

## 2015-02-12 NOTE — Discharge Instructions (Signed)
Upper Respiratory Infection, Adult Most upper respiratory infections (URIs) are a viral infection of the air passages leading to the lungs. A URI affects the nose, throat, and upper air passages. The most common type of URI is nasopharyngitis and is typically referred to as "the common cold." URIs run their course and usually go away on their own. Most of the time, a URI does not require medical attention, but sometimes a bacterial infection in the upper airways can follow a viral infection. This is called a secondary infection. Sinus and middle ear infections are common types of secondary upper respiratory infections. Bacterial pneumonia can also complicate a URI. A URI can worsen asthma and chronic obstructive pulmonary disease (COPD). Sometimes, these complications can require emergency medical care and may be life threatening.  CAUSES Almost all URIs are caused by viruses. A virus is a type of germ and can spread from one person to another.  RISKS FACTORS You may be at risk for a URI if:   You smoke.   You have chronic heart or lung disease.  You have a weakened defense (immune) system.   You are very young or very old.   You have nasal allergies or asthma.  You work in crowded or poorly ventilated areas.  You work in health care facilities or schools. SIGNS AND SYMPTOMS  Symptoms typically develop 2-3 days after you come in contact with a cold virus. Most viral URIs last 7-10 days. However, viral URIs from the influenza virus (flu virus) can last 14-18 days and are typically more severe. Symptoms may include:   Runny or stuffy (congested) nose.   Sneezing.   Cough.   Sore throat.   Headache.   Fatigue.   Fever.   Loss of appetite.   Pain in your forehead, behind your eyes, and over your cheekbones (sinus pain).  Muscle aches.  DIAGNOSIS  Your health care provider may diagnose a URI by:  Physical exam.  Tests to check that your symptoms are not due to  another condition such as:  Strep throat.  Sinusitis.  Pneumonia.  Asthma. TREATMENT  A URI goes away on its own with time. It cannot be cured with medicines, but medicines may be prescribed or recommended to relieve symptoms. Medicines may help:  Reduce your fever.  Reduce your cough.  Relieve nasal congestion. HOME CARE INSTRUCTIONS   Take medicines only as directed by your health care provider.   Gargle warm saltwater or take cough drops to comfort your throat as directed by your health care provider.  Use a warm mist humidifier or inhale steam from a shower to increase air moisture. This may make it easier to breathe.  Drink enough fluid to keep your urine clear or pale yellow.   Eat soups and other clear broths and maintain good nutrition.   Rest as needed.   Return to work when your temperature has returned to normal or as your health care provider advises. You may need to stay home longer to avoid infecting others. You can also use a face mask and careful hand washing to prevent spread of the virus.  Increase the usage of your inhaler if you have asthma.   Do not use any tobacco products, including cigarettes, chewing tobacco, or electronic cigarettes. If you need help quitting, ask your health care provider. PREVENTION  The best way to protect yourself from getting a cold is to practice good hygiene.   Avoid oral or hand contact with people with cold  symptoms.   Wash your hands often if contact occurs.  There is no clear evidence that vitamin C, vitamin E, echinacea, or exercise reduces the chance of developing a cold. However, it is always recommended to get plenty of rest, exercise, and practice good nutrition.  SEEK MEDICAL CARE IF:   You are getting worse rather than better.   Your symptoms are not controlled by medicine.   You have chills.  You have worsening shortness of breath.  You have brown or red mucus.  You have yellow or brown nasal  discharge.  You have pain in your face, especially when you bend forward.  You have a fever.  You have swollen neck glands.  You have pain while swallowing.  You have white areas in the back of your throat. SEEK IMMEDIATE MEDICAL CARE IF:   You have severe or persistent:  Headache.  Ear pain.  Sinus pain.  Chest pain.  You have chronic lung disease and any of the following:  Wheezing.  Prolonged cough.  Coughing up blood.  A change in your usual mucus.  You have a stiff neck.  You have changes in your:  Vision.  Hearing.  Thinking.  Mood. MAKE SURE YOU:   Understand these instructions.  Will watch your condition.  Will get help right away if you are not doing well or get worse.   This information is not intended to replace advice given to you by your health care provider. Make sure you discuss any questions you have with your health care provider.   Document Released: 08/08/2000 Document Revised: 06/29/2014 Document Reviewed: 05/20/2013 Elsevier Interactive Patient Education 2016 [place] [employer] The structures of the external ear canal secrete a waxy substance known as cerumen. Excess cerumen can build up in the ear canal, causing a condition known as cerumen impaction. Cerumen impaction can cause ear pain and disrupt the function of the ear. The rate of cerumen production differs for each individual. In certain individuals, the configuration of the ear canal may decrease his or her ability to naturally remove cerumen. CAUSES Cerumen impaction is caused by excessive cerumen production or buildup. RISK FACTORS  Frequent use of swabs to clean ears.  Having narrow ear canals.  Having eczema.  Being dehydrated. SIGNS AND SYMPTOMS  Diminished hearing.  Ear drainage.  Ear pain.  Ear itch. TREATMENT Treatment may involve:  Over-the-counter or prescription ear drops to soften the cerumen.  Removal of cerumen by a health care  provider. This may be done with:  Irrigation with warm water. This is the most common method of removal.  Ear curettes and other instruments.  Surgery. This may be done in severe cases. HOME CARE INSTRUCTIONS  Take medicines only as directed by your health care provider.  Do not insert objects into the ear with the intent of cleaning the ear. PREVENTION  Do not insert objects into the ear, even with the intent of cleaning the ear. Removing cerumen as a part of normal hygiene is not necessary, and the use of swabs in the ear canal is not recommended.  Drink enough water to keep your urine clear or pale yellow.  Control your eczema if you have it. SEEK MEDICAL CARE IF:  You develop ear pain.  You develop bleeding from the ear.  The cerumen does not clear after you use ear drops as directed.   This information is not intended to replace advice given to you by your health care provider. Make sure you  discuss any questions you have with your health care provider.   Document Released: 03/22/2004 Document Revised: 03/05/2014 Document Reviewed: 09/29/2014 Elsevier Interactive Patient Education [employer].

## 2015-02-12 NOTE — ED Provider Notes (Signed)
CSN: [id number]     Arrival date & time 02/12/15  1421 History   First MD Initiated Contact with Patient 02/12/15 1536     Chief Complaint  Patient presents with  . Cough    Cough since beginning Dec. with "a lot of drainage". Throat hurts when I cough, but doesn't feel sore.    (Consider location/radiation/quality/duration/timing/severity/associated sxs/prior Treatment) HPI  44 yo M reports 3 week hx of feeling "awful"; cough started on Dec 3; has disrupted day and night; No responses to OTC combo drugs. Sure he has had fever but no thermometer Hx of post nasal drip; ear  pressure bilat; scratchy throat irritated by repetitive clearing; never smoker Appetite normal,   Son has asthma and cough  BP up- denies hx- taking cold/cough preparations OTC  Past Medical History  Diagnosis Date  . No pertinent past medical history    Past Surgical History  Procedure Laterality Date  . Circumcision    . Shoulder surgery  2014   Family History  Problem Relation Age of Onset  . Breast cancer Mother   . Lung cancer Mother   . Cancer Maternal Uncle    Social History  Substance Use Topics  . Smoking status: Never Smoker   . Smokeless tobacco: Never Used  . Alcohol Use: No    Review of Systems Constitutional: No fever.  Eyes: No visual changes. [id number]  throat. Cardiovascular:Negative for chest pain/palpitations Respiratory: Negative for shortness of breath, positive cough, repetitive , non-productive Gastrointestinal: No abdominal pain. No nausea,vomiting, diarrhea Genitourinary: Negative for dysuria. Normal urination. Musculoskeletal: Negative for back pain. FROM extremities without pain Skin: Negative for rash Neurological: Negative for headache, focal weakness or numbness  Allergies  Review of patient's allergies indicates no known allergies.  Home Medications   Prior to Admission medications   Medication Sig Start Date End Date Taking? Authorizing Provider   albuterol (PROVENTIL HFA;VENTOLIN HFA) 108 (90 BASE) MCG/ACT inhaler Inhale 1-2 puffs into the lungs every 6 (six) hours as needed for wheezing or shortness of breath. Or cough 02/12/15   Jan Fireman, PA-C  azithromycin (ZITHROMAX) 250 MG tablet Take 1 tablet (250 mg total) by mouth daily. Take first 2 tablets together, then 1 every day until finished. 02/12/15   Jan Fireman, PA-C  benzonatate (TESSALON) 100 MG capsule Take 1 capsule (100 mg total) by mouth 3 (three) times daily as needed. 02/12/15   Jan Fireman, PA-C  chlorpheniramine-HYDROcodone [facility] PENNKINETIC ER) 10-8 MG/5ML SUER Take 5 mLs by mouth once. 02/12/15   Jan Fireman, PA-C  ibuprofen (ADVIL,MOTRIN) 200 MG tablet Take 200 mg by mouth every 6 (six) hours as needed.    Historical Provider, MD  sildenafil (VIAGRA) 25 MG tablet Take 25 mg by mouth as needed for erectile dysfunction.    Historical Provider, MD   Meds Ordered and Administered this Visit   Medications  ipratropium-albuterol (DUONEB) 0.5-2.5 (3) MG/3ML nebulizer solution 3 mL (3 mLs Nebulization Given 02/12/15 1613)  Improved after initial post therapy coughing- felt relief, cough quieted  BP 179/96 mmHg  Pulse 88  Temp(Src) 98.6 F (37 C) (Oral)  Resp 20  Ht 6' (1.829 m)  Wt 264 lb (119.75 kg)  BMI 35.80 kg/m2  SpO2 100% No data found.   Physical Exam  General: NAD, does not appear toxic HEENT:normocephalic,atraumatic, mucous membranes moist,grossly normal hearing- bilateral cerumen impaction- unable to accomplish evacuation with curette Eyes: EOMI, conjunctiva clear, conjugate gaze Neck: supple,no lymphadenopathy Resp : CT  A, bilat; normal respiratory effort, frequent coughing during visit Card : RRR Abd:  Not distended Skin: no rash, skin intact MSK: no deformities, ambulatory without assistance Neuro : good attention,recall-good memory, no focal neuro deficits Psych: speech and behavior appropriate   ED Course  Procedures (including  critical care time)   Nursing intervention with bilateral ear lavage- large amounts of cerumen bilaterally Patient very pleased with change and improved hearing  Labs Review Labs Reviewed - No data to display  Imaging Review No results found.  Discussed seasonal allergies, URI, Cerumen Consider maintaining Ceterizine if symptoms improved post intervention Given duration , inflammation, and irritation will Rx atb and cough therapies Discussed routine weekly use of DeBrox Increase hydration, steam, vaporizer   MDM   1. URI (upper respiratory infection)    Plan:   Diagnosis reviewed with patient--Seasonal Allergies suspected  influential in current URI Z-pak as directed  Rx ceterizine ; - twice daily for 3 days then reduce to daily   Add OTC Guaifenesin DM to thin mucous;  Benzonatate for cough Tussionex at bedtime Albuterol inhaler for day support as needed  Recommend supportive treatment with OTC tylenol/ibuprofen/fluids  Risks ,benefits,potential side effects reviewed- Maintain ceterizine daily to be considered Diagnosis and treatment discussed.  Questions fielded, expectations and recommendations reviewed. Discussed follow up and return parameters including no resolution or any worsening condition. . Patient expresses understanding  and agrees to plan. Will return to [facility]/PCP with questions, concerns or exacerbation.      Jan Fireman, PA-C 02/16/15 [phone]

## 2015-02-16 ENCOUNTER — Encounter: Payer: Self-pay | Admitting: Physician Assistant

## 2015-03-06 ENCOUNTER — Ambulatory Visit (INDEPENDENT_AMBULATORY_CARE_PROVIDER_SITE_OTHER): Payer: Managed Care, Other (non HMO)

## 2015-03-06 ENCOUNTER — Encounter: Payer: Self-pay | Admitting: *Deleted

## 2015-03-06 ENCOUNTER — Ambulatory Visit
Admission: EM | Admit: 2015-03-06 | Discharge: 2015-03-06 | Disposition: A | Discharge status: Home / Self Care | Payer: Managed Care, Other (non HMO) | Attending: Family Medicine | Admitting: Family Medicine

## 2015-03-06 DIAGNOSIS — J209 Acute bronchitis, unspecified: Secondary | ICD-10-CM | POA: Diagnosis not present

## 2015-03-06 DIAGNOSIS — H109 Unspecified conjunctivitis: Secondary | ICD-10-CM

## 2015-03-06 MED ORDER — HYDROCOD POLST-CPM POLST ER 10-8 MG/5ML PO SUER: 5.0000 mL | Freq: Two times a day (BID) | ORAL | Status: DC | PRN

## 2015-03-06 MED ORDER — CEFUROXIME AXETIL 500 MG PO TABS: 500.0000 mg | ORAL_TABLET | Freq: Two times a day (BID) | ORAL | Status: DC

## 2015-03-06 MED ORDER — FEXOFENADINE-PSEUDOEPHED ER 180-240 MG PO TB24: 1.0000 | ORAL_TABLET | Freq: Every day | ORAL | Status: DC

## 2015-03-06 NOTE — ED Notes (Signed)
Pt states that he was here 2 weeks ago for a cough, but cough is not completely gone, Pt feels like he is coughing harder than he should be since he has taken the medication from last visit

## 2015-03-06 NOTE — Discharge Instructions (Signed)
Acute Bronchitis Bronchitis is when the airways that extend from the windpipe into the lungs get red, puffy, and painful (inflamed). Bronchitis often causes thick spit (mucus) to develop. This leads to a cough. A cough is the most common symptom of bronchitis. In acute bronchitis, the condition usually begins suddenly and goes away over time (usually in 2 weeks). Smoking, allergies, and asthma can make bronchitis worse. Repeated episodes of bronchitis may cause more lung problems. HOME CARE  Rest.  Drink enough fluids to keep your pee (urine) clear or pale yellow (unless you need to limit fluids as told by your doctor).  Only take over-the-counter or prescription medicines as told by your doctor.  Avoid smoking and secondhand smoke. These can make bronchitis worse. If you are a smoker, think about using nicotine gum or skin patches. Quitting smoking will help your lungs heal faster.  Reduce the chance of getting bronchitis again by:  Washing your hands often.  Avoiding people with cold symptoms.  Trying not to touch your hands to your mouth, nose, or eyes.  Follow up with your doctor as told. GET HELP IF: Your symptoms do not improve after 1 week of treatment. Symptoms include:  Cough.  Fever.  Coughing up thick spit.  Body aches.  Chest congestion.  Chills.  Shortness of breath.  Sore throat. GET HELP RIGHT AWAY IF:   You have an increased fever.  You have chills.  You have severe shortness of breath.  You have bloody thick spit (sputum).  You throw up (vomit) often.  You lose too much body fluid (dehydration).  You have a severe headache.  You faint. MAKE SURE YOU:   Understand these instructions.  Will watch your condition.  Will get help right away if you are not doing well or get worse.   This information is not intended to replace advice given to you by your health care provider. Make sure you discuss any questions you have with your health care  provider.   Document Released: 08/01/2007 Document Revised: 10/15/2012 Document Reviewed: 08/05/2012 Elsevier Interactive Patient Education 2016 [employer]  Bacterial Conjunctivitis Bacterial conjunctivitis (commonly called pink eye) is redness, soreness, or puffiness (inflammation) of the white part of your eye. It is caused by a germ called bacteria. These germs can easily spread from person to person (contagious). Your eye often will become red or pink. Your eye may also become irritated, watery, or have a thick discharge.  HOME CARE   Apply a cool, clean washcloth over closed eyelids. Do this for 10-20 minutes, 3-4 times a day while you have pain.  Gently wipe away any fluid coming from the eye with a warm, wet washcloth or cotton ball.  Wash your hands often with soap and water. Use paper towels to dry your hands.  Do not share towels or washcloths.  Change or wash your pillowcase every day.  Do not use eye makeup until the infection is gone.  Do not use machines or drive if your vision is blurry.  Stop using contact lenses. Do not use them again until your doctor says it is okay.  Do not touch the tip of the eye drop bottle or medicine tube with your fingers when you put medicine on the eye. GET HELP RIGHT AWAY IF:   Your eye is not better after 3 days of starting your medicine.  You have a yellowish fluid coming out of the eye.  You have more pain in the eye.  Your eye  redness is spreading.  Your vision becomes blurry.  You have a fever or lasting symptoms for more than 2-3 days.  You have a fever and your symptoms suddenly get worse.  You have pain in the face.  Your face gets red or puffy (swollen). MAKE SURE YOU:   Understand these instructions.  Will watch this condition.  Will get help right away if you are not doing well or get worse.   This information is not intended to replace advice given to you by your health care provider. Make sure you  discuss any questions you have with your health care provider.   Document Released: 11/22/2007 Document Revised: 01/30/2012 Document Reviewed: 10/19/2011 Elsevier Interactive Patient Education [employer].

## 2015-03-06 NOTE — ED Provider Notes (Addendum)
CSN: [id number]     Arrival date & time 03/06/15  1302 History   First MD Initiated Contact with Patient 03/06/15 1419    Nurses notes were reviewed. note Chief Complaint  Patient presents with  . Cough   patient has been sick for about 3 weeks 4 weeks. He thought that he's been sick since December 4 look has probably more like December 11th. He was seen here place him on Z-Pak and Tussionex albuterol inhalers never used diagnosis having bronchitis. He does not smoke. States that initially he felt better but then he started having a relapse of his symptoms this last past week and he's felt worse. He has not had any chest x-ray since he can remember. He denies a history of asthma or significant medical problems. His mother had breast and lung cancer and has maternal uncle with another type of cancer.   He also has red left eye He does have a child lives with him this been sick and has had upper respiratory tract infection as well. (Consider location/radiation/quality/duration/timing/severity/associated sxs/prior Treatment) Patient is a 44 y.o. male presenting with cough. The history is provided by the patient.  Cough Cough characteristics:  Productive Sputum characteristics:  Yellow and green Severity:  Moderate Duration: Off and on about 3-4 weeks. Progression:  Worsening Chronicity:  New Context: upper respiratory infection   Relieved by:  Nothing Ineffective treatments:  Cough suppressants Associated symptoms: chest pain, rhinorrhea, shortness of breath and sinus congestion   Associated symptoms: no diaphoresis, no ear fullness and no ear pain   Associated symptoms comment:  Ratio stay he has indigestion or some chest discomfort he is not sure which one   Past Medical History  Diagnosis Date  . No pertinent past medical history    Past Surgical History  Procedure Laterality Date  . Circumcision    . Shoulder surgery  2014   Family History  Problem Relation Age of Onset  .  Breast cancer Mother   . Lung cancer Mother   . Cancer Maternal Uncle    Social History  Substance Use Topics  . Smoking status: Never Smoker   . Smokeless tobacco: Never Used  . Alcohol Use: No    Review of Systems  Constitutional: Negative for diaphoresis.  HENT: Positive for rhinorrhea. Negative for ear pain.   Respiratory: Positive for cough and shortness of breath.   Cardiovascular: Positive for chest pain.  All other systems reviewed and are negative.   Allergies  Review of patient's allergies indicates no known allergies.  Home Medications   Prior to Admission medications   Medication Sig Start Date End Date Taking? Authorizing Provider  albuterol (PROVENTIL HFA;VENTOLIN HFA) 108 (90 BASE) MCG/ACT inhaler Inhale 1-2 puffs into the lungs every 6 (six) hours as needed for wheezing or shortness of breath. Or cough 02/12/15   Jan Fireman, PA-C  azithromycin (ZITHROMAX) 250 MG tablet Take 1 tablet (250 mg total) by mouth daily. Take first 2 tablets together, then 1 every day until finished. 02/12/15   Jan Fireman, PA-C  benzonatate (TESSALON) 100 MG capsule Take 1 capsule (100 mg total) by mouth 3 (three) times daily as needed. 02/12/15   Jan Fireman, PA-C  cefUROXime (CEFTIN) 500 MG tablet Take 1 tablet (500 mg total) by mouth 2 (two) times daily. 03/06/15   Frederich Cha, MD  chlorpheniramine-HYDROcodone [facility] PENNKINETIC ER) 10-8 MG/5ML SUER Take 5 mLs by mouth once. 02/12/15   Jan Fireman, PA-C  chlorpheniramine-HYDROcodone ([place]  PENNKINETIC ER) 10-8 MG/5ML SUER Take 5 mLs by mouth every 12 (twelve) hours as needed for cough. 03/06/15   Frederich Cha, MD  fexofenadine-pseudoephedrine (ALLEGRA-D ALLERGY & CONGESTION) 180-240 MG 24 hr tablet Take 1 tablet by mouth daily. 03/06/15   Frederich Cha, MD  ibuprofen (ADVIL,MOTRIN) 200 MG tablet Take 200 mg by mouth every 6 (six) hours as needed.    Historical Provider, MD  sildenafil (VIAGRA) 25 MG tablet Take 25 mg by mouth as  needed for erectile dysfunction.    Historical Provider, MD   Meds Ordered and Administered this Visit  Medications - No data to display  BP 150/106 mmHg  Pulse 88  Temp(Src) 98.4 F (36.9 C) (Oral)  Ht 6' (1.829 m)  Wt 263 lb (119.296 kg)  BMI 35.66 kg/m2  SpO2 98% No data found.   Physical Exam  Constitutional: He is oriented to person, place, and time. He appears well-developed and well-nourished.  HENT:  Head: Normocephalic and atraumatic.  Right Ear: Hearing, tympanic membrane, external ear and ear canal normal.  Left Ear: Hearing, tympanic membrane, external ear and ear canal normal.  Nose: Mucosal edema present. Right sinus exhibits no maxillary sinus tenderness and no frontal sinus tenderness. Left sinus exhibits no maxillary sinus tenderness and no frontal sinus tenderness.  Mouth/Throat: Posterior oropharyngeal erythema present.  Eyes: Pupils are equal, round, and reactive to light. Right conjunctiva is not injected. Right conjunctiva has no hemorrhage. Left conjunctiva is not injected. Left conjunctiva has a hemorrhage.    Neck: Normal range of motion. Neck supple.  Cardiovascular: Normal rate and regular rhythm.   Pulmonary/Chest: Effort normal and breath sounds normal.  Musculoskeletal: Normal range of motion.  Neurological: He is alert and oriented to person, place, and time.  Skin: Skin is warm and dry.  Psychiatric: He has a normal mood and affect.  Vitals reviewed.   ED Course  Procedures (including critical care time)  Labs Review Labs Reviewed - No data to display  Imaging Review Dg Chest 2 View  03/06/2015  CLINICAL DATA:  Cough and congestion for 6 days EXAM: CHEST  2 VIEW COMPARISON:  None FINDINGS: The heart size and mediastinal contours are within normal limits. Both lungs are clear. The visualized skeletal structures are unremarkable. IMPRESSION: No active cardiopulmonary disease. Electronically Signed   By: Kerby Moors M.D.   On: 03/06/2015  15:03     Visual Acuity Review  Right Eye Distance:   Left Eye Distance:   Bilateral Distance:    Right Eye Near:   Left Eye Near:    Bilateral Near:         MDM   1. Bronchitis, acute, with bronchospasm   2. Conjunctivitis of right eye    We'll place patient on Tussionex 1 teaspoon twice a day, Allegra-D 24 hours daily and will place him on Ceftin 500 mg twice a day. Patient has inhaler home will recommend he start using his inhaler. Chest x-ray was negative for any signs of pneumonia at this time. Work note given for tomorrow as well. Patient was just seen here about 3 weeks ago to treat more aggressively since he got better but then had a relapse.  Patient also has what appears be a right conjunctivitis. At the nurses going to whether his subconjunctival hemorrhage which we'll treat or conjunctivitis the antibiotic that he's on Ceftin 500 should cover him for a conjunctivitis as well.   Frederich Cha, MD 03/06/15 [place], MD 03/06/15 [phone]

## 2015-11-01 ENCOUNTER — Ambulatory Visit
Admission: EM | Admit: 2015-11-01 | Discharge: 2015-11-01 | Disposition: A | Discharge status: Home / Self Care | Payer: Managed Care, Other (non HMO) | Attending: Family Medicine | Admitting: Family Medicine

## 2015-11-01 DIAGNOSIS — J069 Acute upper respiratory infection, unspecified: Secondary | ICD-10-CM

## 2015-11-01 DIAGNOSIS — J029 Acute pharyngitis, unspecified: Secondary | ICD-10-CM

## 2015-11-01 DIAGNOSIS — B309 Viral conjunctivitis, unspecified: Secondary | ICD-10-CM

## 2015-11-01 LAB — RAPID STREP SCREEN (MED CTR MEBANE ONLY): Streptococcus, Group A Screen (Direct): NEGATIVE

## 2015-11-01 MED ORDER — BENZONATATE 100 MG PO CAPS: 100.0000 mg | ORAL_CAPSULE | Freq: Three times a day (TID) | ORAL | 0 refills | Status: DC | PRN

## 2015-11-01 MED ORDER — LIDOCAINE VISCOUS 2 % MT SOLN: 15.0000 mL | Freq: Three times a day (TID) | OROMUCOSAL | 0 refills | Status: DC | PRN

## 2015-11-01 NOTE — Discharge Instructions (Signed)
Take medication as prescribed. Rest. Drink plenty of fluids.  ° °Follow up with your primary care physician this week as needed. Return to Urgent care for new or worsening concerns.  ° °

## 2015-11-01 NOTE — ED Triage Notes (Signed)
Patient complains of sore throat with nasal congestion and drainage. Patient is also complaining eye drainage with redness. Patient states that this started yesterday and worsened last night.

## 2015-11-01 NOTE — ED Provider Notes (Signed)
[facility] ____________________________________________  Time seen: Approximately 1:45 PM  I have reviewed the triage vital signs and the nursing notes.   HISTORY  Chief Complaint Sore Throat  HPI Alexander Simon is a 45 y.o. male presents for the complaint of runny nose, nasal congestion, sore throat, mild cough and watery bilateral eyes since yesterday. Patient reports his son had same symptoms earlier this week. Reports clear watery eyes. Denies any purulent drainage from eyes. Denies any foreign bodies, chemical contacts or eye pain. Denies any foreign body sensation or foreign body exposure to eyes.  Reports took NyQuil last night which helped some. Patient reports he was coughing intermittently throughout the night. Denies any fevers. Denies any chest pain, shortness of breath, abdominal pain, dysuria, extremity pain, extremity swelling, neck or back pain. Wife at bedside.  Dr Brynda Greathouse: PCP   Past Medical History:  Diagnosis Date  . No pertinent past medical history     There are no active problems to display for this patient.   Past Surgical History:  Procedure Laterality Date  . CIRCUMCISION    . SHOULDER SURGERY  2014    No current facility-administered medications for this encounter.   Current Outpatient Prescriptions:  .  albuterol (PROVENTIL HFA;VENTOLIN HFA) 108 (90 BASE) MCG/ACT inhaler, Inhale 1-2 puffs into the lungs every 6 (six) hours as needed for wheezing or shortness of breath. Or cough, Disp: 1 Inhaler, Rfl: 0 .  azithromycin (ZITHROMAX) 250 MG tablet, Take 1 tablet (250 mg total) by mouth daily. Take first 2 tablets together, then 1 every day until finished., Disp: 6 tablet, Rfl: 0 .  benzonatate (TESSALON PERLES) 100 MG capsule, Take 1 capsule (100 mg total) by mouth 3 (three) times daily as needed for cough., Disp: 15 capsule, Rfl: 0 .  cefUROXime (CEFTIN) 500 MG tablet, Take 1 tablet (500 mg total) by mouth 2 (two) times daily., Disp: 20  tablet, Rfl: 0 .  chlorpheniramine-HYDROcodone (TUSSIONEX PENNKINETIC ER) 10-8 MG/5ML SUER, Take 5 mLs by mouth once., Disp: 60 mL, Rfl: 0 .  chlorpheniramine-HYDROcodone (TUSSIONEX PENNKINETIC ER) 10-8 MG/5ML SUER, Take 5 mLs by mouth every 12 (twelve) hours as needed for cough., Disp: 115 mL, Rfl: 0 .  fexofenadine-pseudoephedrine (ALLEGRA-D ALLERGY & CONGESTION) 180-240 MG 24 hr tablet, Take 1 tablet by mouth daily., Disp: 30 tablet, Rfl: 0 .  ibuprofen (ADVIL,MOTRIN) 200 MG tablet, Take 200 mg by mouth every 6 (six) hours as needed., Disp: , Rfl:  .  lidocaine (XYLOCAINE) 2 % solution, Use as directed 15 mLs in the mouth or throat every 8 (eight) hours as needed (sore throat. gargle and spit as needed for sore throat.)., Disp: 100 mL, Rfl: 0 .  sildenafil (VIAGRA) 25 MG tablet, Take 25 mg by mouth as needed for erectile dysfunction., Disp: , Rfl:   Allergies Review of patient's allergies indicates no known allergies.  Family History  Problem Relation Age of Onset  . Breast cancer Mother   . Lung cancer Mother   . Cancer Maternal Uncle     Social History Social History  Substance Use Topics  . Smoking status: Never Smoker  . Smokeless tobacco: Never Used  . Alcohol use No    Review of Systems Constitutional: No fever/chills Eyes: No visual changes. ENT: As above. Cardiovascular: Denies chest pain. Respiratory: Denies shortness of breath. Gastrointestinal: No abdominal pain.  No nausea, no vomiting.  No diarrhea.  No constipation. Genitourinary: Negative for dysuria. Musculoskeletal: Negative for back pain. Skin: Negative for  rash. Neurological: Negative for headaches, focal weakness or numbness.  10-point ROS otherwise negative.  ____________________________________________   PHYSICAL EXAM:  VITAL SIGNS: ED Triage Vitals  Enc Vitals Group     BP 11/01/15 1244 (!) 152/88     Pulse Rate 11/01/15 1244 84     Resp 11/01/15 1244 17     Temp 11/01/15 1244 98.1 F (36.7  C)     Temp Source 11/01/15 1244 Tympanic     SpO2 11/01/15 1244 98 %     Weight 11/01/15 1241 266 lb (120.7 kg)     Height 11/01/15 1241 6' (1.829 m)     Head Circumference --      Peak Flow --      Pain Score 11/01/15 1242 6     Pain Loc --      Pain Edu? --      Excl. in GC? --      Visual Acuity  Right Eye Distance: 20/30 Left Eye Distance: 20/30 Bilateral Distance: 20/30    Constitutional: Alert and oriented. Well appearing and in no acute distress. Eyes: Mild bilateral conjunctival injection, no exudate, no drainage. PERRL. EOMI.Nontender and no surrounding swelling.  Head: Atraumatic. No sinus tenderness to palpation. No swelling. No erythema.  Ears: no erythema, normal TMs bilaterally.   Nose:Nasal congestion with clear rhinorrhea  Mouth/Throat: Mucous membranes are moist. Mild pharyngeal erythema. No tonsillar swelling or exudate.  Neck: No stridor.  No cervical spine tenderness to palpation. Hematological/Lymphatic/Immunilogical: No cervical lymphadenopathy. Cardiovascular: Normal rate, regular rhythm. Grossly normal heart sounds.  Good peripheral circulation. Respiratory: Normal respiratory effort.  No retractions. Lungs CTAB.No wheezes, rales or rhonchi. Good air movement.  Gastrointestinal: Soft and nontender.  No CVA tenderness. Musculoskeletal: No lower or upper extremity tenderness nor edema. No cervical, thoracic or lumbar tenderness to palpation. Neurologic:  Normal speech and language. No gross focal neurologic deficits are appreciated. No gait instability. Skin:  Skin is warm, dry and intact. No rash noted. Psychiatric: Mood and affect are normal. Speech and behavior are normal.  ___________________________________________   LABS (all labs ordered are listed, but only abnormal results are displayed)  Labs Reviewed  RAPID STREP SCREEN (NOT AT [facility])  CULTURE, GROUP A STREP [facility])    PROCEDURES Procedures    _____________________________________   INITIAL IMPRESSION / ASSESSMENT AND PLAN / ED COURSE  Pertinent labs & imaging results that were available during my care of the patient were reviewed by me and considered in my medical decision making (see chart for details).  Well-appearing patient. No acute distress. Wife at bedside. Presents for the complaints of cough, congestion, sore throat and watery eyes. Exam reassuring. Lungs clear throughout. Quick strep negative, will culture. Suspect viral upper respiratory illness, viral pharyngitis and viral conjunctivitis. Discussed in detail with patient we'll treats supportively and symptomatically. Over-the-counter decongestants, when necessary Viscous Lidocaine gargles and when necessary Tessalon Perles. Over-the-counter Tylenol or ibuprofen as needed. Encourage rest and fluids.Discussed indication, risks and benefits of medications with patient.  Discussed follow up with Primary care physician this week. Discussed follow up and return parameters including no resolution or any worsening concerns. Patient verbalized understanding and agreed to plan.   ____________________________________________   FINAL CLINICAL IMPRESSION(S) / ED DIAGNOSES  Final diagnoses:  Viral upper respiratory illness  Pharyngitis  Viral conjunctivitis     Discharge Medication List as of 11/01/2015  1:27 PM    START taking these medications   Details  lidocaine (XYLOCAINE) 2 % solution Use as  directed 15 mLs in the mouth or throat every 8 (eight) hours as needed (sore throat. gargle and spit as needed for sore throat.)., Starting Tue 11/01/2015, Normal        Note: This dictation was prepared with Dragon dictation along with smaller phrase technology. Any transcriptional errors that result from this process are unintentional.    Clinical Course      Marylene Land, NP 11/01/15 1430    Marylene Land, NP 11/01/15 1431

## 2015-11-04 LAB — CULTURE, GROUP A STREP (THRC)

## 2015-11-09 ENCOUNTER — Telehealth: Payer: Self-pay

## 2015-11-09 NOTE — Telephone Encounter (Signed)
Patient called in today. He was seen on  11/01/2015 and was treated for a viral infection. Patient states that he is still having sinus drainage and coughing with his sore throat. He states that he feels like he needs an antibiotic sent in to the pharmacy. Please advise. CB# [phone]

## 2015-11-09 NOTE — Telephone Encounter (Signed)
Notify patient, recommend visit for re-evaluation

## 2015-11-13 NOTE — Telephone Encounter (Signed)
Called patient and informed him that he will need to come back in for re evaluation. Patient reports feeling better but still has a sore throat. Encouraged patient to follow up with PCP if symptoms become worse. Patient confirmed understanding of information and direction.

## 2015-11-27 ENCOUNTER — Encounter: Payer: Self-pay | Admitting: *Deleted

## 2015-11-27 ENCOUNTER — Ambulatory Visit
Admission: EM | Admit: 2015-11-27 | Discharge: 2015-11-27 | Disposition: A | Discharge status: Home / Self Care | Payer: Managed Care, Other (non HMO) | Attending: Family Medicine | Admitting: Family Medicine

## 2015-11-27 DIAGNOSIS — H10021 Other mucopurulent conjunctivitis, right eye: Secondary | ICD-10-CM | POA: Diagnosis not present

## 2015-11-27 DIAGNOSIS — H1031 Unspecified acute conjunctivitis, right eye: Secondary | ICD-10-CM

## 2015-11-27 DIAGNOSIS — J0110 Acute frontal sinusitis, unspecified: Secondary | ICD-10-CM

## 2015-11-27 MED ORDER — AZITHROMYCIN 250 MG PO TABS: 250.0000 mg | ORAL_TABLET | Freq: Every day | ORAL | 0 refills | Status: DC

## 2015-11-27 NOTE — ED Provider Notes (Signed)
CSN: [id number]     Arrival date & time 11/27/15  [id number] History   First MD Initiated Contact with Patient 11/27/15 0930     Chief Complaint  Patient presents with  . Conjunctivitis   (Consider location/radiation/quality/duration/timing/severity/associated sxs/prior Treatment) Was seen here for Upper resp sx last month and states that he has been having sx ever since of RT eye drainage, itching, cough , congestion. Has been taking OTC decongestants and cough meds with no relief. Denies any fevers, no sob. Discussed pts bp and concerns of high readings. Pt states that his mother had HTN and brother does. Has had an increase in wt and stressors at home. Denies any chest pain, no leg swelling.       Past Medical History:  Diagnosis Date  . No pertinent past medical history    Past Surgical History:  Procedure Laterality Date  . CIRCUMCISION    . SHOULDER SURGERY  2014   Family History  Problem Relation Age of Onset  . Breast cancer Mother   . Lung cancer Mother   . Cancer Maternal Uncle    Social History  Substance Use Topics  . Smoking status: Never Smoker  . Smokeless tobacco: Never Used  . Alcohol use No    Review of Systems  Constitutional: Negative.   HENT: Positive for congestion.   Eyes: Positive for discharge and itching.       Rt eye   Respiratory: Negative.   Cardiovascular: Negative.   Neurological: Negative.     Allergies  Review of patient's allergies indicates no known allergies.  Home Medications   Prior to Admission medications   Medication Sig Start Date End Date Taking? Authorizing Provider  benzonatate (TESSALON PERLES) 100 MG capsule Take 1 capsule (100 mg total) by mouth 3 (three) times daily as needed for cough. 11/01/15  Yes Marylene Land, NP  cetirizine (ZYRTEC) 10 MG tablet Take 10 mg by mouth daily.   Yes Historical Provider, MD  chlorpheniramine-HYDROcodone (TUSSIONEX PENNKINETIC ER) 10-8 MG/5ML SUER Take 5 mLs by mouth once. 02/12/15  Yes  Jan Fireman, PA-C  ibuprofen (ADVIL,MOTRIN) 200 MG tablet Take 200 mg by mouth every 6 (six) hours as needed.   Yes Historical Provider, MD  albuterol (PROVENTIL HFA;VENTOLIN HFA) 108 (90 BASE) MCG/ACT inhaler Inhale 1-2 puffs into the lungs every 6 (six) hours as needed for wheezing or shortness of breath. Or cough 02/12/15   Jan Fireman, PA-C  azithromycin (ZITHROMAX) 250 MG tablet Take 1 tablet (250 mg total) by mouth daily. Take first 2 tablets together, then 1 every day until finished. 11/27/15   Melanee Left, NP  cefUROXime (CEFTIN) 500 MG tablet Take 1 tablet (500 mg total) by mouth 2 (two) times daily. 03/06/15   Frederich Cha, MD  chlorpheniramine-HYDROcodone [facility] PENNKINETIC ER) 10-8 MG/5ML SUER Take 5 mLs by mouth every 12 (twelve) hours as needed for cough. 03/06/15   Frederich Cha, MD  fexofenadine-pseudoephedrine (ALLEGRA-D ALLERGY & CONGESTION) 180-240 MG 24 hr tablet Take 1 tablet by mouth daily. 03/06/15   Frederich Cha, MD  lidocaine (XYLOCAINE) 2 % solution Use as directed 15 mLs in the mouth or throat every 8 (eight) hours as needed (sore throat. gargle and spit as needed for sore throat.). 11/01/15   Marylene Land, NP  sildenafil (VIAGRA) 25 MG tablet Take 25 mg by mouth as needed for erectile dysfunction.    Historical Provider, MD   Meds Ordered and Administered this Visit  Medications - No data  to display  BP (!) 171/94 (BP Location: Left Arm)   Pulse 83   Temp 98.3 F (36.8 C) (Oral)   Resp 16   Ht 6' (1.829 m)   Wt 266 lb (120.7 kg)   SpO2 100%   BMI 36.08 kg/m  No data found.   Physical Exam  Constitutional: He is oriented to person, place, and time. He appears well-developed and well-nourished.  HENT:  Head: Normocephalic.  Nose: Nose normal.  Eyes: Pupils are equal, round, and reactive to light. Right eye exhibits discharge.  Erythema to conjunctivae of the rt eye , denies any visual changes, denies any pressure behind eye   Cardiovascular: Normal rate  and regular rhythm.   Pulmonary/Chest: Breath sounds normal.  Neurological: He is oriented to person, place, and time.    Urgent Care Course   Clinical Course    Procedures (including critical care time)  Labs Review Labs Reviewed - No data to display  Imaging Review No results found.   Visual Acuity Review  Right Eye Distance:   Left Eye Distance:   Bilateral Distance:    Right Eye Near:   Left Eye Near:    Bilateral Near:         MDM   1. Acute bacterial conjunctivitis of right eye   2. Acute non-recurrent frontal sinusitis    Need to avoid OTC medications with dm due to blood pressure elevation Use warm compresses to RT eye May use a humidifier to help  Need to follow up with PCP for bp monitor and treatment Wash hands frequently  Discussed the importance of bp control and the effects for bp continually elevated.    Melanee Left, NP 11/27/15 1406    Melanee Left, NP 11/27/15 1406

## 2015-11-27 NOTE — ED Triage Notes (Signed)
Patient started having symptoms of right eye drainage and redness for one month. Additional symptom of throat itchiness and cough are also present.

## 2015-11-27 NOTE — Discharge Instructions (Signed)
Need to avoid OTC medications with dm due to blood pressure elevation Use warm compresses to RT eye May use a humidifier to help  Need to follow up with PCP for bp monitor and treatment

## 2016-09-22 ENCOUNTER — Ambulatory Visit
Admission: EM | Admit: 2016-09-22 | Discharge: 2016-09-22 | Disposition: A | Discharge status: Home / Self Care | Payer: Commercial Managed Care - PPO | Attending: Physician Assistant | Admitting: Physician Assistant

## 2016-09-22 DIAGNOSIS — J302 Other seasonal allergic rhinitis: Secondary | ICD-10-CM | POA: Diagnosis not present

## 2016-09-22 DIAGNOSIS — R05 Cough: Secondary | ICD-10-CM

## 2016-09-22 DIAGNOSIS — M79671 Pain in right foot: Secondary | ICD-10-CM

## 2016-09-22 DIAGNOSIS — H00011 Hordeolum externum right upper eyelid: Secondary | ICD-10-CM

## 2016-09-22 DIAGNOSIS — R059 Cough, unspecified: Secondary | ICD-10-CM

## 2016-09-22 MED ORDER — CIPROFLOXACIN HCL 0.3 % OP SOLN
1.0000 [drp] | OPHTHALMIC | 0 refills | Status: DC
Start: 2016-09-22 — End: 2017-03-09

## 2016-09-22 MED ORDER — CETIRIZINE HCL 10 MG PO TABS: 10.0000 mg | ORAL_TABLET | Freq: Every day | ORAL | 0 refills | Status: DC

## 2016-09-22 MED ORDER — GUAIFENESIN-DM 100-10 MG/5ML PO SYRP: 10.0000 mL | ORAL_SOLUTION | ORAL | 0 refills | Status: DC | PRN

## 2016-09-22 NOTE — Discharge Instructions (Signed)
-  start taking Zyrtec 10mg  daily -Robitussin for cough. 86ml every 4 hours as needed -Tylenol or ibuprofen for pain -can try an Ace type wrap or other support to foot until improved - follow up with PCP in next 7-10 days - can return to clinic should symptoms worsen or not improve -warm hot compress to R eye ever 3-4 hours to help resolve stye -ciprofloxacin eye drops as directed.

## 2016-09-22 NOTE — ED Provider Notes (Signed)
CSN: [id number]     Arrival date & time 09/22/16  0809 History   First MD Initiated Contact with Patient 09/22/16 [phone]     Chief Complaint  Patient presents with  . Cough  . Foot Pain  . Eye Drainage   (Consider location/radiation/quality/duration/timing/severity/associated sxs/prior Treatment) Patient is a 26 show male who presents with complaint of eye drainage 2 weeks, occasional cough with throat drainage, and shortness of breath on occasion. Patient states he had some had some sweating noted on chart twice at night over this time period. Patient denies chest pain but reports some occasional tightness. Patient denies any visual changes. Patient reports some occasional shortness of breath but denies any abdominal problems other than a right hernia.  Patient reports his eye drainage was was not clear initially but that has been more lately and that the drainage began on the right both eyes. Patient has not been taking any medications for his symptoms. He does report his grandson has been sick recently with upper respiratory symptoms.  In regards to his foot, patient complains of burning sensation type pain to the outside of his right foot. She also reports occasional swelling with trouble pivoting pointing his whole foot down and inverting. Patient states that the symptoms have been on and off for about one month. Patient denies any redness.  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 Patient denies any issues from this lipoma.       Past Medical History:  Diagnosis Date  . No pertinent past medical history    Past Surgical History:  Procedure Laterality Date  . CIRCUMCISION    . SHOULDER SURGERY  2014   Family History  Problem Relation Age of Onset  . Breast cancer Mother   . Lung cancer Mother   . Cancer Maternal Uncle    Social History  Substance Use Topics  . Smoking status: Never  Smoker  . Smokeless tobacco: Never Used  . Alcohol use No    Review of Systems As noted above in history of present illness. Other systems reviewed and found to be unremarkable.  Allergies  Patient has no known allergies.  Home Medications   Prior to Admission medications   Medication Sig Start Date End Date Taking? Authorizing Provider  albuterol (PROVENTIL HFA;VENTOLIN HFA) 108 (90 BASE) MCG/ACT inhaler Inhale 1-2 puffs into the lungs every 6 (six) hours as needed for wheezing or shortness of breath. Or cough 02/12/15   Jan Fireman, PA-C  azithromycin (ZITHROMAX) 250 MG tablet Take 1 tablet (250 mg total) by mouth daily. Take first 2 tablets together, then 1 every day until finished. 11/27/15   Melanee Left, NP  benzonatate (TESSALON PERLES) 100 MG capsule Take 1 capsule (100 mg total) by mouth 3 (three) times daily as needed for cough. 11/01/15   Marylene Land, NP  cefUROXime (CEFTIN) 500 MG tablet Take 1 tablet (500 mg total) by mouth 2 (two) times daily. 03/06/15   Frederich Cha, MD  cetirizine (ZYRTEC) 10 MG tablet Take 10 mg by mouth daily.    [provider]  cetirizine (ZYRTEC) 10 MG tablet Take 1 tablet (10 mg total) by mouth daily. 09/22/16   Luvenia Redden, PA-C  chlorpheniramine-HYDROcodone (TUSSIONEX PENNKINETIC ER) 10-8 MG/5ML SUER Take 5 mLs by mouth once. 02/12/15   Jan Fireman, PA-C  chlorpheniramine-HYDROcodone (TUSSIONEX PENNKINETIC ER) 10-8 MG/5ML  SUER Take 5 mLs by mouth every 12 (twelve) hours as needed for cough. 03/06/15   Frederich Cha, MD  ciprofloxacin (CILOXAN) 0.3 % ophthalmic solution Place 1 drop into the right eye every 4 (four) hours while awake. Administer 1 drop, every 2 hours, while awake, for 2 days. Then 1 drop, every 4 hours, while awake, for the next 5 days. 09/22/16   Luvenia Redden, PA-C  fexofenadine-pseudoephedrine (ALLEGRA-D ALLERGY & CONGESTION) 180-240 MG 24 hr tablet Take 1 tablet by mouth daily. 03/06/15   Frederich Cha, MD    guaiFENesin-dextromethorphan (ROBITUSSIN DM) 100-10 MG/5ML syrup Take 10 mLs by mouth every 4 (four) hours as needed for cough. 09/22/16   Luvenia Redden, PA-C  ibuprofen (ADVIL,MOTRIN) 200 MG tablet Take 200 mg by mouth every 6 (six) hours as needed.    [provider]  lidocaine (XYLOCAINE) 2 % solution Use as directed 15 mLs in the mouth or throat every 8 (eight) hours as needed (sore throat. gargle and spit as needed for sore throat.). 11/01/15   Marylene Land, NP  sildenafil (VIAGRA) 25 MG tablet Take 25 mg by mouth as needed for erectile dysfunction.    [provider]   Meds Ordered and Administered this Visit  Medications - No data to display  BP (!) 182/100 (BP Location: Left Arm)   Pulse 84   Temp 98.2 F (36.8 C) (Oral)   Resp 18   Ht 6' (1.829 m)   Wt 269 lb (122 kg)   SpO2 100%   BMI 36.48 kg/m  No data found.   Physical Exam  Constitutional: He appears well-developed and well-nourished.  HENT:  Head: Normocephalic and atraumatic.  Right Ear: Ear canal normal. Tympanic membrane is injected. No middle ear effusion.  Left Ear: Ear canal normal. Tympanic membrane is injected.  No middle ear effusion.  Eyes: Pupils are equal, round, and reactive to light. EOM and lids are normal. Right eye exhibits no discharge and no exudate. Left eye exhibits no discharge and no exudate. Right conjunctiva is injected. Left conjunctiva is injected.    Sty R upper eyelid  Neck: Normal range of motion. Neck supple.    Cardiovascular: Normal rate, regular rhythm and normal heart sounds.   No murmur heard. Pulmonary/Chest: Effort normal and breath sounds normal. No respiratory distress. He has no wheezes. He has no rales.  Abdominal: Soft. Bowel sounds are normal. He exhibits distension.  Musculoskeletal: Normal range of motion.       Right foot: There is no tenderness.       Feet:  Lymphadenopathy:    He has no cervical adenopathy.  Skin: He is not diaphoretic.     Urgent Care Course     Procedures (including critical care time)  Labs Review Labs Reviewed - No data to display  Imaging Review No results found.    MDM   1. Right foot pain   2. Seasonal allergic rhinitis, unspecified trigger   3. Cough   4. Hordeolum externum of right upper eyelid    Discharge Medication List as of 09/22/2016  9:32 AM    START taking these medications   Details  !! cetirizine (ZYRTEC) 10 MG tablet Take 1 tablet (10 mg total) by mouth daily., Starting Sat 09/22/2016, Normal    ciprofloxacin (CILOXAN) 0.3 % ophthalmic solution Place 1 drop into the right eye every 4 (four) hours while awake. Administer 1 drop, every 2 hours, while awake, for 2 days. Then 1 drop, every  4 hours, while awake, for the next 5 days., Starting Sat 09/22/2016, Normal    guaiFENesin-dextromethorphan (ROBITUSSIN DM) 100-10 MG/5ML syrup Take 10 mLs by mouth every 4 (four) hours as needed for cough., Starting Sat 09/22/2016, Normal     !! - Potential duplicate medications found. Please discuss with provider.     Patient presents with complaint of itchy eyes and cough for a couple weeks. Patient was some redness to the conjunctiva and a stye noted the right upper eyelid. Patient reported Zyrtec hasn't helped with his eye symptoms in the past requiring antibiotic. Plan give patient a prescription for Robitussin-DM and Zyrtec to help with the allergy symptoms. Recommend fluids. Recommend an Ace wrap type support for his foot until his symptoms improve. Also recommend warm moist compresses to the right eye every 3-4 hours as well as Cipro eyedrops for his stye. Patient advised to follow with his primary care provider.  Luvenia Redden, PA-C     Luvenia Redden, PA-C 09/22/16 [phone]

## 2016-09-22 NOTE — ED Triage Notes (Signed)
Pt with multiple complaints. Has had chest congestion and cough along with watery eyes x past 2 weeks. No fever. Pt also complains of intermittent right foot "burning sensation" on lateral aspect of foot.  Has been happening approx 2 months off and on. Denies pain currently.

## 2017-03-08 ENCOUNTER — Emergency Department: Payer: Commercial Managed Care - PPO

## 2017-03-08 ENCOUNTER — Encounter: Payer: Self-pay | Admitting: *Deleted

## 2017-03-08 ENCOUNTER — Other Ambulatory Visit: Payer: Self-pay

## 2017-03-08 DIAGNOSIS — R0789 Other chest pain: Principal | ICD-10-CM | POA: Insufficient documentation

## 2017-03-08 DIAGNOSIS — Z79899 Other long term (current) drug therapy: Secondary | ICD-10-CM | POA: Diagnosis not present

## 2017-03-08 DIAGNOSIS — I160 Hypertensive urgency: Secondary | ICD-10-CM | POA: Insufficient documentation

## 2017-03-08 DIAGNOSIS — J069 Acute upper respiratory infection, unspecified: Secondary | ICD-10-CM | POA: Diagnosis not present

## 2017-03-08 DIAGNOSIS — I10 Essential (primary) hypertension: Secondary | ICD-10-CM | POA: Diagnosis not present

## 2017-03-08 DIAGNOSIS — Z7982 Long term (current) use of aspirin: Secondary | ICD-10-CM | POA: Diagnosis not present

## 2017-03-08 DIAGNOSIS — R079 Chest pain, unspecified: Secondary | ICD-10-CM | POA: Diagnosis present

## 2017-03-08 DIAGNOSIS — Z6837 Body mass index (BMI) 37.0-37.9, adult: Secondary | ICD-10-CM | POA: Diagnosis not present

## 2017-03-08 LAB — CBC
HEMATOCRIT: 39.1 % — AB (ref 40.0–52.0)
HEMOGLOBIN: 12.7 g/dL — AB (ref 13.0–18.0)
MCH: 25.8 pg — ABNORMAL LOW (ref 26.0–34.0)
MCHC: 32.5 g/dL (ref 32.0–36.0)
MCV: 79.4 fL — ABNORMAL LOW (ref 80.0–100.0)
Platelets: 224 10*3/uL (ref 150–440)
RBC: 4.92 MIL/uL (ref 4.40–5.90)
RDW: 14.5 % (ref 11.5–14.5)
WBC: 7.8 10*3/uL (ref 3.8–10.6)

## 2017-03-08 LAB — BASIC METABOLIC PANEL
ANION GAP: 10 (ref 5–15)
BUN: 15 mg/dL (ref 6–20)
CO2: 24 mmol/L (ref 22–32)
Calcium: 8.9 mg/dL (ref 8.9–10.3)
Chloride: 106 mmol/L (ref 101–111)
Creatinine, Ser: 1.03 mg/dL (ref 0.61–1.24)
GFR calc non Af Amer: >60 mL/min (ref >60)
Glucose, Bld: 104 mg/dL — ABNORMAL HIGH (ref 65–99)
POTASSIUM: 3.6 mmol/L (ref 3.5–5.1)
Sodium: 140 mmol/L (ref 135–145)

## 2017-03-08 LAB — TROPONIN I

## 2017-03-08 NOTE — ED Triage Notes (Signed)
Pt to ED reporting generalized chest pain that started last night. Pt reports he has had the pain intermittently over the past year. Pt reports SOB with cough and congestion. Lightheaded and dizziness reported yesterday. Pain is not reported to radiate anywhere.

## 2017-03-09 ENCOUNTER — Other Ambulatory Visit: Payer: Self-pay

## 2017-03-09 ENCOUNTER — Observation Stay
Admission: EM | Admit: 2017-03-09 | Discharge: 2017-03-09 | Disposition: A | Discharge status: Home / Self Care | Payer: Commercial Managed Care - PPO | Attending: Internal Medicine | Admitting: Internal Medicine

## 2017-03-09 DIAGNOSIS — R05 Cough: Secondary | ICD-10-CM

## 2017-03-09 DIAGNOSIS — I160 Hypertensive urgency: Secondary | ICD-10-CM | POA: Diagnosis not present

## 2017-03-09 DIAGNOSIS — I10 Essential (primary) hypertension: Secondary | ICD-10-CM

## 2017-03-09 DIAGNOSIS — R079 Chest pain, unspecified: Secondary | ICD-10-CM | POA: Diagnosis present

## 2017-03-09 DIAGNOSIS — R9431 Abnormal electrocardiogram [ECG] [EKG]: Secondary | ICD-10-CM

## 2017-03-09 DIAGNOSIS — J069 Acute upper respiratory infection, unspecified: Secondary | ICD-10-CM

## 2017-03-09 HISTORY — DX: Essential (primary) hypertension: I10

## 2017-03-09 LAB — TROPONIN I
Troponin I: <0.03 ng/mL (ref <0.03)
Troponin I: <0.03 ng/mL (ref <0.03)

## 2017-03-09 LAB — TSH: TSH: 1.636 u[IU]/mL (ref 0.350–4.500)

## 2017-03-09 LAB — LIPID PANEL
Cholesterol: 189 mg/dL (ref 0–200)
HDL: 37 mg/dL — ABNORMAL LOW (ref >40)
LDL Cholesterol: 103 mg/dL — ABNORMAL HIGH (ref 0–99)
Total CHOL/HDL Ratio: 5.1 RATIO
Triglycerides: 244 mg/dL — ABNORMAL HIGH (ref <150)
VLDL: 49 mg/dL — ABNORMAL HIGH (ref 0–40)

## 2017-03-09 LAB — HEMOGLOBIN A1C
Hgb A1c MFr Bld: 6.1 % — ABNORMAL HIGH (ref 4.8–5.6)
Mean Plasma Glucose: 128.37 mg/dL

## 2017-03-09 MED ORDER — LISINOPRIL 10 MG PO TABS
10.0000 mg | ORAL_TABLET | Freq: Every day | ORAL | Status: DC
  Administered 2017-03-09: 10 mg via ORAL
  Filled 2017-03-09: qty 1

## 2017-03-09 MED ORDER — ONDANSETRON HCL 4 MG PO TABS: 4.0000 mg | ORAL_TABLET | Freq: Four times a day (QID) | ORAL | Status: DC | PRN

## 2017-03-09 MED ORDER — ACETAMINOPHEN 325 MG PO TABS: 650.0000 mg | ORAL_TABLET | Freq: Four times a day (QID) | ORAL | Status: DC | PRN

## 2017-03-09 MED ORDER — LISINOPRIL 20 MG PO TABS
20.0000 mg | ORAL_TABLET | Freq: Once | ORAL | Status: AC
  Administered 2017-03-09: 20 mg via ORAL
  Filled 2017-03-09: qty 1

## 2017-03-09 MED ORDER — AZITHROMYCIN 250 MG PO TABS: ORAL_TABLET | ORAL | 0 refills | Status: AC

## 2017-03-09 MED ORDER — ASPIRIN 81 MG PO CHEW
324.0000 mg | CHEWABLE_TABLET | Freq: Once | ORAL | Status: AC
  Administered 2017-03-09: 324 mg via ORAL
  Filled 2017-03-09: qty 4

## 2017-03-09 MED ORDER — NITROGLYCERIN 2 % TD OINT
1.0000 [in_us] | TOPICAL_OINTMENT | Freq: Once | TRANSDERMAL | Status: AC
  Administered 2017-03-09: 1 [in_us] via TOPICAL
  Filled 2017-03-09: qty 1

## 2017-03-09 MED ORDER — ENOXAPARIN SODIUM 40 MG/0.4ML ~~LOC~~ SOLN: 40.0000 mg | SUBCUTANEOUS | Status: DC

## 2017-03-09 MED ORDER — ACETAMINOPHEN 650 MG RE SUPP: 650.0000 mg | Freq: Four times a day (QID) | RECTAL | Status: DC | PRN

## 2017-03-09 MED ORDER — HYDRALAZINE HCL 20 MG/ML IJ SOLN
INTRAMUSCULAR | Status: AC
  Administered 2017-03-09: 10 mg via INTRAVENOUS
  Filled 2017-03-09: qty 1

## 2017-03-09 MED ORDER — AMLODIPINE BESYLATE 10 MG PO TABS
10.0000 mg | ORAL_TABLET | Freq: Every day | ORAL | Status: DC
  Administered 2017-03-09: 10 mg via ORAL
  Filled 2017-03-09: qty 1

## 2017-03-09 MED ORDER — LISINOPRIL 30 MG PO TABS: 30.0000 mg | ORAL_TABLET | Freq: Every day | ORAL | 11 refills | Status: DC

## 2017-03-09 MED ORDER — ASPIRIN EC 81 MG PO TBEC
162.0000 mg | DELAYED_RELEASE_TABLET | Freq: Every day | ORAL | Status: DC
  Administered 2017-03-09: 162 mg via ORAL
  Filled 2017-03-09: qty 2

## 2017-03-09 MED ORDER — AMLODIPINE BESYLATE 10 MG PO TABS: 10.0000 mg | ORAL_TABLET | Freq: Every day | ORAL | Status: DC

## 2017-03-09 MED ORDER — ONDANSETRON HCL 4 MG/2ML IJ SOLN: 4.0000 mg | Freq: Four times a day (QID) | INTRAMUSCULAR | Status: DC | PRN

## 2017-03-09 MED ORDER — HYDRALAZINE HCL 20 MG/ML IJ SOLN
10.0000 mg | INTRAMUSCULAR | Status: AC
Start: 2017-03-09 — End: 2017-03-09
  Administered 2017-03-09: 10 mg via INTRAVENOUS

## 2017-03-09 MED ORDER — DOCUSATE SODIUM 100 MG PO CAPS
100.0000 mg | ORAL_CAPSULE | Freq: Two times a day (BID) | ORAL | Status: DC
  Administered 2017-03-09: 100 mg via ORAL
  Filled 2017-03-09: qty 1

## 2017-03-09 MED ORDER — AMLODIPINE BESYLATE 10 MG PO TABS: 10.0000 mg | ORAL_TABLET | Freq: Every day | ORAL | 2 refills | Status: DC

## 2017-03-09 NOTE — Progress Notes (Signed)
[place] responded to order request for advance directive information. [place] met with patient and patient denied wanting an Forensic scientist. [place] explained briefly about the advanced directive and left a copy with the patient. Patient will contact [facility] office if he changes his mind.     03/09/17 1235  Clinical Encounter Type  Visited With Patient  Visit Type Initial;Spiritual support  Referral From Nurse;Physician

## 2017-03-09 NOTE — Consult Note (Addendum)
Cardiology Consultation:   Patient ID: Alexander Simon; [id number]; [birth year]   Admit date: 03/09/2017 Date of Consult: 03/09/2017  Primary Care Provider: Patient, No Pcp Per Primary Cardiologist: New to [facility] Physician requesting consult: Dr. Dian Queen Reason for consult: chest pressure, shortness of breath, HTN  Patient Profile:   Alexander Simon is a 47 y.o. male with a hx of obesity, hypertension who presents with chest pressure, shortness of breath , cough.   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  Developed some chest tightness with coughing on route to his destination, moderate difficulty recovering. Went home that night slept in his own bed, in the morning some very light discomfort worse with coughing.  Decided to come to the emergency room for further evaluation.  At baseline denies having any symptoms, is active with no chest pain.  No regular exercise program. Weight trending upwards over the past several years, eating the wrong foods  Past Medical History:  Diagnosis Date  . Hypertension   . No pertinent past medical history     Past Surgical History:  Procedure Laterality Date  . CIRCUMCISION    . SHOULDER SURGERY  2014     Home Medications:  Prior to Admission medications   Medication Sig Start Date End Date Taking? Authorizing Provider  aspirin EC 81 MG tablet Take 162 mg by mouth daily.   Yes [provider]  chlorpheniramine-HYDROcodone (TUSSIONEX PENNKINETIC ER) 10-8 MG/5ML SUER Take 5 mLs  by mouth every 12 (twelve) hours as needed for cough. 03/06/15  Yes Frederich Cha, MD  ibuprofen (ADVIL,MOTRIN) 200 MG tablet Take 200 mg by mouth every 6 (six) hours as needed for headache or mild pain.    Yes [provider]  lisinopril (PRINIVIL,ZESTRIL) 10 MG tablet Take 10 mg by mouth daily. 12/31/16  Yes [provider]    Inpatient Medications: Scheduled Meds: . amLODipine  10 mg Oral Daily  . aspirin EC  162 mg Oral Daily  . docusate sodium  100 mg Oral BID  . enoxaparin (LOVENOX) injection  40 mg Subcutaneous Q24H  . lisinopril  10 mg Oral Daily   Continuous Infusions:  PRN Meds: acetaminophen **OR** acetaminophen, ondansetron **OR** ondansetron (ZOFRAN) IV  Allergies:   No Known Allergies  Social History:   Social History   Socioeconomic History  . Marital status: Divorced    Spouse name: Not on file  . Number of children: Not on file  . Years of education: Not on file  . Highest education level: Not on file  Social Needs  . Financial resource strain: Not on file  . Food insecurity - worry: Not on file  . Food insecurity - inability: Not on file  . Transportation needs - medical: Not on file  . Transportation needs - non-medical: Not on file  Occupational History  . Not on file  Tobacco Use  . Smoking status: Never Smoker  . Smokeless tobacco: Never Used  Substance and Sexual Activity  . Alcohol use: No  Alcohol/week: 0.0 oz  . Drug use: No  . Sexual activity: Yes  Other Topics Concern  . Not on file  Social History Narrative  . Not on file    Family History:    Family History  Problem Relation Age of Onset  . Breast cancer Mother   . Lung cancer Mother   . Cancer Maternal Uncle      ROS:  Please see the history of present illness.  Review of Systems  Constitution: Negative for diaphoresis, fever, weakness, malaise/fatigue and night sweats.  HENT: Negative.   Eyes: Negative.   Cardiovascular: Positive for chest pain. Negative  for claudication, cyanosis, dyspnea on exertion, irregular heartbeat, leg swelling, near-syncope, orthopnea, palpitations and paroxysmal nocturnal dyspnea.  Respiratory: Positive for cough. Negative for shortness of breath, sleep disturbances due to breathing and wheezing.   Endocrine: Negative.   Hematologic/Lymphatic: Negative.   Skin: Negative.   Musculoskeletal: Negative for falls, joint pain, joint swelling and myalgias.  Gastrointestinal: Negative.   Neurological: Negative for difficulty with concentration, excessive daytime sleepiness, dizziness, focal weakness, light-headedness and numbness.  Psychiatric/Behavioral: Negative.     All other ROS reviewed and negative.     Physical Exam/Data:   Vitals:   03/09/17 0341 03/09/17 0503 03/09/17 0912 03/09/17 1218  BP: (!) 175/110 (!) 159/88 (!) 183/102 (!) 175/88  Pulse: 75  77 86  Resp: 18     Temp: (!) 97.3 F (36.3 C)  (!) 97.5 F (36.4 C)   TempSrc: Oral  Oral   SpO2: 99%  99%   Weight: 279 lb 1.6 oz (126.6 kg)     Height: 6' (1.829 m)       Intake/Output Summary (Last 24 hours) at 03/09/2017 1256 Last data filed at 03/09/2017 1032 Gross per 24 hour  Intake 240 ml  Output 400 ml  Net -160 ml   Filed Weights   03/08/17 2204 03/09/17 0341  Weight: 270 lb (122.5 kg) 279 lb 1.6 oz (126.6 kg)   Body mass index is 37.85 kg/m.  General:  Well nourished, well developed, in no acute distress HEENT: normal Lymph: no adenopathy Neck: no JVD Endocrine:  No thryomegaly Vascular: No carotid bruits; FA pulses 2+ bilaterally without bruits  Cardiac:  normal S1, S2; RRR; no murmur  Lungs:  clear to auscultation bilaterally, no wheezing, rhonchi or rales  Abd: soft, nontender, no hepatomegaly  Ext: no edema Musculoskeletal:  No deformities, BUE and BLE strength normal and equal Skin: warm and dry  Neuro:  CNs 2-12 intact, no focal abnormalities noted Psych:  Normal affect   EKG:  The EKG was personally reviewed and  demonstrates:   Normal sinus rhythm with nonspecific ST and T wave abnormality anterolateral leads V3 through V6, I and aVL  Telemetry:  Telemetry was personally reviewed and demonstrates: Normal sinus rhythm  Relevant CV Studies:  No studies performed  Laboratory Data:  Chemistry Recent Labs  Lab 03/08/17 2213  NA 140  K 3.6  CL 106  CO2 24  GLUCOSE 104*  BUN 15  CREATININE 1.03  CALCIUM 8.9  GFRNONAA >60  GFRAA >60  ANIONGAP 10    No results for input(s): PROT, ALBUMIN, AST, ALT, ALKPHOS, BILITOT in the last 168 hours. Hematology Recent Labs  Lab 03/08/17 2213  WBC 7.8  RBC 4.92  HGB 12.7*  HCT 39.1*  MCV 79.4*  MCH 25.8*  MCHC 32.5  RDW 14.5  PLT 224   Cardiac Enzymes Recent Labs  Lab 03/08/17 2213  03/09/17 0120 03/09/17 0731  TROPONINI <0.03 <0.03 <0.03   No results for input(s): TROPIPOC in the last 168 hours.  BNPNo results for input(s): BNP, PROBNP in the last 168 hours.  DDimer No results for input(s): DDIMER in the last 168 hours.  Radiology/Studies:  Dg Chest 2 View  Result Date: 03/08/2017 CLINICAL DATA:  Chest pain EXAM: CHEST  2 VIEW COMPARISON:  03/06/2015 FINDINGS: The heart size and mediastinal contours are within normal limits. Both lungs are clear. Elevation of left diaphragm unchanged. The visualized skeletal structures are unremarkable. IMPRESSION: No active cardiopulmonary disease. Electronically Signed   By: Donavan Foil M.D.   On: 03/08/2017 22:37    Assessment and Plan:   1. Chest discomfort Atypical in nature, likely secondary to coughing. Cardiac enzymes negative x3 Nonspecific EKG changes Long discussion with him concerning various treatment options Suspect he has hypertensive heart disease resulting in repolarization abnormality/nonspecific anterolateral T waves.  These EKG changes actually started back in 2014 and have been progressive EKG today appears improved from yesterday, unable to exclude lead placement We have  offered him to stay with stress testing on Monday and he prefers to have outpatient testing -This is appropriate given negative cardiac enzymes, nonspecific EKG, atypical chest pain symptoms on presentation -He may benefit from [facility], could even consider CT coronary calcium scoring.  Both studies discussed with him in detail.  We will call him early next week to see if he would like to proceed with further testing  2.  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  Morbid obesity Long discussion concerning dietary changes,  Need for exercise/weight loss   4. URI He reports significant cough over the past week, requesting "something for it" Mild productive sputum, uri likely contributing to chest tightness We will defer to hospitalist service, could consider short course of antibiotic for bronchitis such as Z-Pak   Total encounter time more than 110 minutes  Greater than 50% was spent in counseling and coordination of care with the patient  For questions or updates, please contact [place] Please consult [email or web address] for contact info under Cardiology/STEMI.   Signed, Ida Rogue, MD  03/09/2017 12:56 PM

## 2017-03-09 NOTE — ED Provider Notes (Signed)
[facility] Emergency Department Provider Note   ____________________________________________   First MD Initiated Contact with Patient 03/09/17 0034     (approximate)  I have reviewed the triage vital signs and the nursing notes.   HISTORY  Chief Complaint Chest Pain    HPI Alexander Simon is a 47 y.o. male who presents to the ED from home with a chief complaint of chest pain.  Patient has a history of hypertension, on lisinopril, who has noted intermittent chest pain over the past year.  Usually associated with exertion.  Yesterday while at work approximately noon time, patient was running to a meeting and felt left-sided chest pressure.  At the time it was associated with shortness of breath.  States his chest pains are never associated with diaphoresis, nausea/vomiting, palpitations or dizziness.  Has had recent cold type symptoms with cough productive of yellow sputum and nasal congestion.  Denies associated fever, chills, abdominal pain, dysuria, diarrhea.  Denies recent travel, trauma or hormone use.   Past Medical History:  Diagnosis Date  . Hypertension   . No pertinent past medical history     There are no active problems to display for this patient.   Past Surgical History:  Procedure Laterality Date  . CIRCUMCISION    . SHOULDER SURGERY  2014    Prior to Admission medications   Medication Sig Start Date End Date Taking? Authorizing Provider  albuterol (PROVENTIL HFA;VENTOLIN HFA) 108 (90 BASE) MCG/ACT inhaler Inhale 1-2 puffs into the lungs every 6 (six) hours as needed for wheezing or shortness of breath. Or cough 02/12/15   Jan Fireman, PA-C  azithromycin (ZITHROMAX) 250 MG tablet Take 1 tablet (250 mg total) by mouth daily. Take first 2 tablets together, then 1 every day until finished. 11/27/15   Marney Setting, NP  benzonatate (TESSALON PERLES) 100 MG capsule Take 1 capsule (100 mg total) by mouth 3 (three) times daily as needed  for cough. 11/01/15   Marylene Land, NP  cefUROXime (CEFTIN) 500 MG tablet Take 1 tablet (500 mg total) by mouth 2 (two) times daily. 03/06/15   Frederich Cha, MD  cetirizine (ZYRTEC) 10 MG tablet Take 10 mg by mouth daily.    [provider]  cetirizine (ZYRTEC) 10 MG tablet Take 1 tablet (10 mg total) by mouth daily. 09/22/16   Luvenia Redden, PA-C  chlorpheniramine-HYDROcodone (TUSSIONEX PENNKINETIC ER) 10-8 MG/5ML SUER Take 5 mLs by mouth once. 02/12/15   Jan Fireman, PA-C  chlorpheniramine-HYDROcodone (TUSSIONEX PENNKINETIC ER) 10-8 MG/5ML SUER Take 5 mLs by mouth every 12 (twelve) hours as needed for cough. 03/06/15   Frederich Cha, MD  ciprofloxacin (CILOXAN) 0.3 % ophthalmic solution Place 1 drop into the right eye every 4 (four) hours while awake. Administer 1 drop, every 2 hours, while awake, for 2 days. Then 1 drop, every 4 hours, while awake, for the next 5 days. 09/22/16   Luvenia Redden, PA-C  fexofenadine-pseudoephedrine (ALLEGRA-D ALLERGY & CONGESTION) 180-240 MG 24 hr tablet Take 1 tablet by mouth daily. 03/06/15   Frederich Cha, MD  guaiFENesin-dextromethorphan (ROBITUSSIN DM) 100-10 MG/5ML syrup Take 10 mLs by mouth every 4 (four) hours as needed for cough. 09/22/16   Luvenia Redden, PA-C  ibuprofen (ADVIL,MOTRIN) 200 MG tablet Take 200 mg by mouth every 6 (six) hours as needed.    [provider]  lidocaine (XYLOCAINE) 2 % solution Use as directed 15 mLs in the mouth or throat every 8 (eight)  hours as needed (sore throat. gargle and spit as needed for sore throat.). 11/01/15   Marylene Land, NP  sildenafil (VIAGRA) 25 MG tablet Take 25 mg by mouth as needed for erectile dysfunction.    [provider]    Allergies Patient has no known allergies.  Family History  Problem Relation Age of Onset  . Breast cancer Mother   . Lung cancer Mother   . Cancer Maternal Uncle     Social History Social History   Tobacco Use  . Smoking status: Never Smoker    . Smokeless tobacco: Never Used  Substance Use Topics  . Alcohol use: No    Alcohol/week: 0.0 oz  . Drug use: No    Review of Systems  Constitutional: No fever/chills. Eyes: No visual changes. ENT: No sore throat. Cardiovascular: Positive for chest pain. Respiratory: Denies shortness of breath. Gastrointestinal: No abdominal pain.  No nausea, no vomiting.  No diarrhea.  No constipation. Genitourinary: Negative for dysuria. Musculoskeletal: Negative for back pain. Skin: Negative for rash. Neurological: Negative for headaches, focal weakness or numbness.   ____________________________________________   PHYSICAL EXAM:  VITAL SIGNS: ED Triage Vitals  Enc Vitals Group     BP 03/08/17 2210 (!) 167/98     Pulse Rate 03/08/17 2210 92     Resp 03/08/17 2210 16     Temp 03/08/17 2210 98.2 F (36.8 C)     Temp Source 03/08/17 2210 Oral     SpO2 03/08/17 2210 99 %     Weight 03/08/17 2204 270 lb (122.5 kg)     Height 03/08/17 2204 6' (1.829 m)     Head Circumference --      Peak Flow --      Pain Score 03/08/17 2204 8     Pain Loc --      Pain Edu? --      Excl. in [place]? --     Constitutional: Alert and oriented. Well appearing and in no acute distress. Eyes: Conjunctivae are normal. PERRL. EOMI. Head: Atraumatic. Nose: No congestion/rhinnorhea. Mouth/Throat: Mucous membranes are moist.  Oropharynx non-erythematous. Neck: No stridor.  No carotid bruits. Cardiovascular: Normal rate, regular rhythm. Grossly normal heart sounds.  Good peripheral circulation. Respiratory: Normal respiratory effort.  No retractions. Lungs CTAB. Gastrointestinal: Soft and nontender. No distention. No abdominal bruits. No CVA tenderness. Musculoskeletal: No lower extremity tenderness nor edema.  No joint effusions. Neurologic:  Normal speech and language. No gross focal neurologic deficits are appreciated.  Skin:  Skin is warm, dry and intact. No rash noted. Psychiatric: Mood and affect are  normal. Speech and behavior are normal.  ____________________________________________   LABS (all labs ordered are listed, but only abnormal results are displayed)  Labs Reviewed  BASIC METABOLIC PANEL - Abnormal; Notable for the following components:      Result Value   Glucose, Bld 104 (*)    All other components within normal limits  CBC - Abnormal; Notable for the following components:   Hemoglobin 12.7 (*)    HCT 39.1 (*)    MCV 79.4 (*)    MCH 25.8 (*)    All other components within normal limits  TROPONIN I  TROPONIN I   ____________________________________________  EKG  ED ECG REPORT I, SUNG,JADE J, the attending physician, personally viewed and interpreted this ECG.   Date: 03/09/2017  EKG Time: 2200  Rate: 89  Rhythm: normal EKG, normal sinus rhythm  Axis: Normal  Intervals:none  ST&T Change: T wave depression  in inferior lateral leads New from 2014  ____________________________________________  RADIOLOGY  Dg Chest 2 View  Result Date: 03/08/2017 CLINICAL DATA:  Chest pain EXAM: CHEST  2 VIEW COMPARISON:  03/06/2015 FINDINGS: The heart size and mediastinal contours are within normal limits. Both lungs are clear. Elevation of left diaphragm unchanged. The visualized skeletal structures are unremarkable. IMPRESSION: No active cardiopulmonary disease. Electronically Signed   By: Donavan Foil M.D.   On: 03/08/2017 22:37    ____________________________________________   PROCEDURES  Procedure(s) performed: None  Procedures  Critical Care performed: No  ____________________________________________   INITIAL IMPRESSION / ASSESSMENT AND PLAN / ED COURSE  As part of my medical decision making, I reviewed the following data within the [place] History obtained from family, Nursing notes reviewed and incorporated, Labs reviewed, EKG interpreted, Old chart reviewed, Radiograph reviewed and Notes from prior ED visits.   47 year old male  with hypertension presents with chest pain. Differential diagnosis includes, but is not limited to, ACS, aortic dissection, pulmonary embolism, cardiac tamponade, pneumothorax, pneumonia, pericarditis, myocarditis, GI-related causes including esophagitis/gastritis, and musculoskeletal chest wall pain.    While initial troponin is unremarkable, given patient's acute episode of chest pain paired with EKG changes and hypertension, will discuss with hospitalist to evaluate patient in the emergency department for admission.      ____________________________________________   FINAL CLINICAL IMPRESSION(S) / ED DIAGNOSES  Final diagnoses:  Nonspecific chest pain  Essential hypertension     ED Discharge Orders    None       Note:  This document was prepared using Dragon voice recognition software and may include unintentional dictation errors.    Paulette Blanch, MD 03/09/17 [phone]

## 2017-03-09 NOTE — Discharge Planning (Signed)
Discharge instructions reviewed with pt. Pt verbalizes understanding. Pt ready for discharge home. 

## 2017-03-09 NOTE — H&P (Signed)
Alexander Simon is an 47 y.o. male.   Chief Complaint: Chest pain HPI: Patient with past medical history of hypertension presents to the emergency department complaining of chest pain.  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  The pain resolved when the patient rested.  He remembers feeling nausea as well as shortness of breath but denies diaphoresis.  In the emergency department his systolic pressure was found to be consistently above 170.  Nitropaste was applied to his chest.  EKG showed some T wave inversions which prompted the emergency department staff to call the hospitalist service for further evaluation.  Past Medical History:  Diagnosis Date  . Hypertension   . No pertinent past medical history     Past Surgical History:  Procedure Laterality Date  . CIRCUMCISION    . SHOULDER SURGERY  2014    Family History  Problem Relation Age of Onset  . Breast cancer Mother   . Lung cancer Mother   . Cancer Maternal Uncle    Social History:  reports that  has never smoked. he has never used smokeless tobacco. He reports that he does not drink alcohol or use drugs.  Allergies: No Known Allergies  Medications Prior to Admission  Medication Sig Dispense Refill  . aspirin EC 81 MG tablet Take 162 mg by mouth daily.    . chlorpheniramine-HYDROcodone (TUSSIONEX PENNKINETIC ER) 10-8 MG/5ML SUER Take 5 mLs by mouth every 12 (twelve) hours as needed for cough. 115 mL 0  . ibuprofen (ADVIL,MOTRIN) 200 MG tablet Take 200 mg by mouth every 6 (six) hours as needed for headache or mild pain.     Marland Kitchen lisinopril (PRINIVIL,ZESTRIL) 10 MG tablet Take 10 mg by mouth daily.  3    Results for orders placed or performed during the hospital encounter of 03/09/17 (from the past 48 hour(s))  Basic metabolic panel     Status: Abnormal   Collection Time: 03/08/17 10:13 PM  Result Value Ref Range   Sodium 140 135 - 145 mmol/L   Potassium 3.6 3.5 - 5.1 mmol/L   Chloride 106 101 - 111 mmol/L   CO2 24 22 - 32 mmol/L   Glucose, Bld 104 (H) 65 - 99 mg/dL   BUN 15 6 - 20 mg/dL   Creatinine, Ser 1.03 0.61 - 1.24 mg/dL   Calcium 8.9 8.9 - 10.3 mg/dL   GFR calc non Af Amer >60 >60 mL/min   GFR calc Af Amer >60 >60 mL/min    Comment: (NOTE) The eGFR has been calculated using the CKD EPI equation. This calculation has not been validated in all clinical situations. eGFR's persistently <60 mL/min signify possible Chronic Kidney Disease.    Anion gap 10 5 - 15    Comment: Performed at [facility], [address] [place], [place] [address]  CBC     Status: Abnormal   Collection Time: 03/08/17 10:13 PM  Result Value Ref Range   WBC 7.8 3.8 - 10.6 K/uL   RBC 4.92 4.40 - 5.90 MIL/uL   Hemoglobin 12.7 (L) 13.0 - 18.0 g/dL   HCT 39.1 (L) 40.0 - 52.0 %   MCV 79.4 (L) 80.0 - 100.0 fL   MCH 25.8 (L) 26.0 - 34.0 pg   MCHC 32.5 32.0 - 36.0 g/dL   RDW  14.5 11.5 - 14.5 %   Platelets 224 150 - 440 K/uL    Comment: Performed at [facility], [address] [place], [place] [address]  Troponin I     Status: None   Collection Time: 03/08/17 10:13 PM  Result Value Ref Range   Troponin I <0.03 <0.03 ng/mL    Comment: Performed at [facility], [address] [place], [place] [address]  Troponin I     Status: None   Collection Time: 03/09/17  1:20 AM  Result Value Ref Range   Troponin I <0.03 <0.03 ng/mL    Comment: Performed at [facility], [address] [place], [place] [address]   Dg Chest 2 View  Result Date: 03/08/2017 CLINICAL DATA:  Chest pain EXAM: CHEST  2 VIEW COMPARISON:  03/06/2015 FINDINGS: The heart size and mediastinal contours are within normal limits. Both lungs are clear. Elevation of left diaphragm unchanged. The visualized skeletal structures are unremarkable. IMPRESSION: No  active cardiopulmonary disease. Electronically Signed   By: Donavan Foil M.D.   On: 03/08/2017 22:37    Review of Systems  Constitutional: Negative for chills and fever.  HENT: Negative for sore throat and tinnitus.   Eyes: Negative for blurred vision and redness.  Respiratory: Negative for cough and shortness of breath.   Cardiovascular: Positive for chest pain (resolved). Negative for palpitations, orthopnea and PND.  Gastrointestinal: Negative for abdominal pain, diarrhea, nausea and vomiting.  Genitourinary: Negative for dysuria, frequency and urgency.  Musculoskeletal: Negative for joint pain and myalgias.  Skin: Negative for rash.       No lesions  Neurological: Negative for speech change, focal weakness and weakness.  Endo/Heme/Allergies: Does not bruise/bleed easily.       No temperature intolerance  Psychiatric/Behavioral: Negative for depression and suicidal ideas.    Blood pressure (!) 175/110, pulse 75, temperature (!) 97.3 F (36.3 C), temperature source Oral, resp. rate 18, height 6' (1.829 m), weight 126.6 kg (279 lb 1.6 oz), SpO2 99 %. Physical Exam  Vitals reviewed. Constitutional: He is oriented to person, place, and time. He appears well-developed and well-nourished. No distress.  HENT:  Head: Normocephalic and atraumatic.  Mouth/Throat: Oropharynx is clear and moist.  Eyes: Conjunctivae and EOM are normal. Pupils are equal, round, and reactive to light. No scleral icterus.  Neck: Normal range of motion. Neck supple. No JVD present. No tracheal deviation present. No thyromegaly present.  Cardiovascular: Normal rate and regular rhythm. Exam reveals no gallop and no friction rub.  No murmur heard. Respiratory: Effort normal and breath sounds normal. No respiratory distress.  GI: Soft. Bowel sounds are normal. He exhibits no distension. There is no tenderness.  Genitourinary:  Genitourinary Comments: Deferred  Musculoskeletal: Normal range of motion. He exhibits  no edema.  Lymphadenopathy:    He has no cervical adenopathy.  Neurological: He is alert and oriented to person, place, and time. No cranial nerve deficit.  Skin: Skin is warm and dry. No rash noted. No erythema.  Psychiatric: He has a normal mood and affect. His behavior is normal. Judgment and thought content normal.     Assessment/Plan This is a 47 year old male admitted for chest pain. 1.  Chest pain: Anginal symptoms; continue to follow cardiac biomarkers.  Monitor telemetry.  Consult cardiology.  Continue aspirin 2.  Essential hypertension: Uncontrolled; continue lisinopril.  Hydralazine as needed.  Continue nitrate therapy 3.  Obesity: BMI is 37.8; encouraged healthy diet and exercise 4.  DVT prophylaxis: Lovenox  5.  GI prophylaxis: None Exline the patient is a full code.  Time spent on admission orders and patient care approximately 45 minutes  Harrie Foreman, MD 03/09/2017, 3:46 AM

## 2017-03-09 NOTE — Plan of Care (Signed)
  Progressing Education: Knowledge of General Education information will improve 03/09/2017 0421 - Progressing by Loran Senters, RN Safety: Ability to remain free from injury will improve 03/09/2017 0421 - Progressing by Loran Senters, RN Education: Understanding of cardiac disease, CV risk reduction, and recovery process will improve 03/09/2017 0421 - Progressing by Loran Senters, RN Activity: Ability to tolerate increased activity will improve 03/09/2017 0421 - Progressing by Loran Senters, RN Cardiac: Ability to achieve and maintain adequate cardiovascular perfusion will improve 03/09/2017 0421 - Progressing by Loran Senters, RN

## 2017-03-09 NOTE — Discharge Instructions (Signed)
[facility] -  at [facility] ° °DIET:  °Cardiac diet ° °DISCHARGE CONDITION:  °Stable ° °ACTIVITY:  °Activity as tolerated ° °OXYGEN:  °Home Oxygen: No. °  °Oxygen Delivery: room air ° °DISCHARGE LOCATION:  °home  ° ° °ADDITIONAL DISCHARGE INSTRUCTION: ° ° °If you experience worsening of your admission symptoms, develop shortness of breath, life threatening emergency, suicidal or homicidal thoughts you must seek medical attention immediately by calling 911 or calling your MD immediately  if symptoms less severe. ° °You Must read complete instructions/literature along with all the possible adverse reactions/side effects for all the Medicines you take and that have been prescribed to you. Take any new Medicines after you have completely understood and accpet all the possible adverse reactions/side effects.  ° °Please note ° °You were cared for by a hospitalist during your hospital stay. If you have any questions about your discharge medications or the care you received while you were in the hospital after you are discharged, you can call the unit and asked to speak with the hospitalist on call if the hospitalist that took care of you is not available. Once you are discharged, your primary care physician will handle any further medical issues. Please note that NO REFILLS for any discharge medications will be authorized once you are discharged, as it is imperative that you return to your primary care physician (or establish a relationship with a primary care physician if you do not have one) for your aftercare needs so that they can reassess your need for medications and monitor your lab values. ° ° °

## 2017-03-09 NOTE — Discharge Summary (Signed)
[place] at [facility], [place] y.o., DOB Jun 20, 1970, MRN [id number]. Admission date: 03/09/2017 Discharge Date 03/09/2017 Primary MD Patient, No Pcp Per Admitting Physician Harrie Foreman, MD  Admission Diagnosis  Essential hypertension [I10] Nonspecific chest pain [R07.9]  Discharge Diagnosis   Active Problems: Chest pain due to elevated blood pressure Accelerated hypertension Morbid obesity   Hospital Course  Patient is a 47 year old African-American male presented with complaint of chest pain.  Patient's blood pressure was elevated in the 170s.  EKG did show some T wave inversion.  Patient cardiac enzymes remain negative.  He was seen in consultation by Dr. Rockey Situ of cardiology who recommended controlling his blood pressure and ambulating him and discharging home.  Patient's blood pressure is improved.  He is feeling much better and wants to go home.  He will need outpatient follow-up with cardiology for stress test.             Consults  cardiology  Significant Tests:  See full reports for all details     Dg Chest 2 View  Result Date: 03/08/2017 CLINICAL DATA:  Chest pain EXAM: CHEST  2 VIEW COMPARISON:  03/06/2015 FINDINGS: The heart size and mediastinal contours are within normal limits. Both lungs are clear. Elevation of left diaphragm unchanged. The visualized skeletal structures are unremarkable. IMPRESSION: No active cardiopulmonary disease. Electronically Signed   By: Donavan Foil M.D.   On: 03/08/2017 22:37       Today   Subjective:   Alexander Simon  Pt denies any cp   Objective:   Blood pressure (!) 179/89, pulse 66, temperature (!) 97.5 F (36.4 C), temperature source Oral, resp. rate 18, height 6' (1.829 m), weight 279 lb 1.6 oz (126.6 kg), SpO2 99 %.  .  Intake/Output Summary (Last 24 hours) at 03/09/2017 1658 Last data filed at 03/09/2017 1300 Gross per 24 hour  Intake 240 ml  Output 550 ml  Net -310 ml     Exam VITAL SIGNS: Blood pressure (!) 179/89, pulse 66, temperature (!) 97.5 F (36.4 C), temperature source Oral, resp. rate 18, height 6' (1.829 m), weight 279 lb 1.6 oz (126.6 kg), SpO2 99 %.  GENERAL:  47 y.o.-year-old patient lying in the bed with no acute distress.  EYES: Pupils equal, round, reactive to light and accommodation. No scleral icterus. Extraocular muscles intact.  HEENT: Head atraumatic, normocephalic. Oropharynx and nasopharynx clear.  NECK:  Supple, no jugular venous distention. No thyroid enlargement, no tenderness.  LUNGS: Normal breath sounds bilaterally, no wheezing, rales,rhonchi or crepitation. No use of accessory muscles of respiration.  CARDIOVASCULAR: S1, S2 normal. No murmurs, rubs, or gallops.  ABDOMEN: Soft, nontender, nondistended. Bowel sounds present. No organomegaly or mass.  EXTREMITIES: No pedal edema, cyanosis, or clubbing.  NEUROLOGIC: Cranial nerves II through XII are intact. Muscle strength 5/5 in all extremities. Sensation intact. Gait not checked.  PSYCHIATRIC: The patient is alert and oriented x 3.  SKIN: No obvious rash, lesion, or ulcer.   Data Review     CBC w Diff:  Lab Results  Component Value Date   WBC 7.8 03/08/2017   HGB 12.7 (L) 03/08/2017   HGB 13.5 09/19/2012   HCT 39.1 (L) 03/08/2017   HCT 40.6 09/19/2012   PLT 224 03/08/2017   PLT 229 09/19/2012   CMP:  Lab Results  Component Value Date   NA 140 03/08/2017   NA 138 09/19/2012   K 3.6 03/08/2017   K  3.9 09/19/2012   CL 106 03/08/2017   CL 105 09/19/2012   CO2 24 03/08/2017   CO2 30 09/19/2012   BUN 15 03/08/2017   BUN 14 09/19/2012   CREATININE 1.03 03/08/2017   CREATININE 1.00 09/19/2012  .  Micro Results No results found for this or any previous visit (from the past 240 hour(s)).      Code Status Orders  (From admission, onward)        Start     Ordered   03/09/17 0341  Full code  Continuous     03/09/17 0340    Code Status History     Date Active Date Inactive Code Status Order ID Comments User Context   This patient has a current code status but no historical code status.          Follow-up Information    Minna Merritts, MD Follow up in 1 week(s).   Specialty:  Cardiology Why:  chest pain Contact information: [place] [address] [phone]           Discharge Medications   Allergies as of 03/09/2017   No Known Allergies     Medication List    TAKE these medications   amLODipine 10 MG tablet Commonly known as:  NORVASC Take 1 tablet (10 mg total) by mouth daily. Start taking on:  03/10/2017   aspirin EC 81 MG tablet Take 162 mg by mouth daily.   azithromycin 250 MG tablet Commonly known as:  ZITHROMAX Z-PAK Take 2 tablets (500 mg) on  Day 1,  followed by 1 tablet (250 mg) once daily on Days 2 through 5.   chlorpheniramine-HYDROcodone 10-8 MG/5ML Suer Commonly known as:  TUSSIONEX PENNKINETIC ER Take 5 mLs by mouth every 12 (twelve) hours as needed for cough.   ibuprofen 200 MG tablet Commonly known as:  ADVIL,MOTRIN Take 200 mg by mouth every 6 (six) hours as needed for headache or mild pain.   lisinopril 30 MG tablet Commonly known as:  PRINIVIL,ZESTRIL Take 1 tablet (30 mg total) by mouth daily. What changed:    medication strength  how much to take          Total Time in preparing paper work, data evaluation and todays exam - 35 minutes  Dustin Flock M.D on 03/09/2017 at 4:58 PM  [facility]   Office  [phone]

## 2017-03-12 ENCOUNTER — Telehealth: Payer: Self-pay

## 2017-03-12 NOTE — Telephone Encounter (Signed)
Patient calling to schedule [facility] appt for chest pain htn   Offered same day appt patient declined as he is at work today    Scheduled next available and added to waitlist

## 2017-04-09 NOTE — Progress Notes (Deleted)
Cardiology Office Note  Date:  04/09/2017   ID:  Alexander Simon, DOB 10-30-1970, MRN [id number]  PCP:  Patient, No Pcp Per   No chief complaint on file.   HPI:  47 year old African-American male with hx of Chest pain Hospital admission 02/2017 for HTN, chest pain, abn ekg, shortness of breath , cough.   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  Developed some chest tightness with coughing on route to his destination, moderate difficulty recovering. Went home that night slept in his own bed, in the morning some very light discomfort worse with coughing.  Decided to come to the emergency room for further evaluation.  At baseline denies having any symptoms, is active with no chest pain.  No regular exercise program. Weight trending upwards over the past several years, eating the wrong foods     mild productive cough over the past week concerning for upper respiratory infection,  Patient's blood pressure was elevated in the 170s.   EKG did show some T wave inversion.   cardiac enzymes remain negative.   Recommended outpt stress test.  d/c with amlodipine and lisinopril    PMH:   has a past medical history of Hypertension and No pertinent past medical history.  PSH:    Past Surgical History:  Procedure Laterality Date  . CIRCUMCISION    . SHOULDER SURGERY  2014    Current Outpatient Medications  Medication Sig Dispense Refill  . amLODipine (NORVASC) 10 MG tablet Take 1 tablet (10 mg total) by mouth daily. 30 tablet 2  . aspirin EC 81 MG tablet Take 162 mg by mouth daily.    . chlorpheniramine-HYDROcodone (TUSSIONEX PENNKINETIC ER) 10-8 MG/5ML SUER Take 5 mLs by mouth every 12 (twelve) hours as needed for cough. 115 mL 0  .  ibuprofen (ADVIL,MOTRIN) 200 MG tablet Take 200 mg by mouth every 6 (six) hours as needed for headache or mild pain.     Marland Kitchen lisinopril (PRINIVIL,ZESTRIL) 30 MG tablet Take 1 tablet (30 mg total) by mouth daily. 30 tablet 11   No current facility-administered medications for this visit.      Allergies:   Patient has no known allergies.   Social History:  The patient  reports that  has never smoked. he has never used smokeless tobacco. He reports that he does not drink alcohol or use drugs.   Family History:   family history includes Breast cancer in his mother; Cancer in his maternal uncle; Lung cancer in his mother.    Review of Systems: ROS   PHYSICAL EXAM: VS:  There were no vitals taken for this visit. , BMI There is no height or weight on file to calculate BMI. GEN: Well nourished, well developed, in no acute distress  HEENT: normal  Neck: no JVD, carotid bruits, or masses Cardiac: RRR; no murmurs, rubs, or gallops,no edema  Respiratory:  clear to auscultation bilaterally, normal work of breathing GI: soft, nontender, nondistended, + BS MS: no deformity or atrophy  Skin: warm and dry, no rash Neuro:  Strength and sensation are intact Psych: euthymic mood, full affect    Recent Labs: 03/08/2017: BUN 15; Creatinine, Ser 1.03; Hemoglobin 12.7; Platelets 224; Potassium 3.6; Sodium 140 03/09/2017: TSH  1.636    Lipid Panel Lab Results  Component Value Date   CHOL 189 03/09/2017   HDL 37 (L) 03/09/2017   LDLCALC 103 (H) 03/09/2017   TRIG 244 (H) 03/09/2017      Wt Readings from Last 3 Encounters:  03/09/17 279 lb 1.6 oz (126.6 kg)  09/22/16 269 lb (122 kg)  11/27/15 266 lb (120.7 kg)       ASSESSMENT AND PLAN:  No diagnosis found.   Disposition:   F/U  6 months  No orders of the defined types were placed in this encounter.    Signed, Esmond Plants, M.D., Ph.D. 04/09/2017  [place], [place]

## 2017-04-10 ENCOUNTER — Ambulatory Visit: Payer: Commercial Managed Care - PPO | Admitting: Cardiovascular Disease

## 2017-04-11 ENCOUNTER — Encounter: Payer: Self-pay | Admitting: Cardiovascular Disease

## 2017-08-01 ENCOUNTER — Ambulatory Visit
Admission: EM | Admit: 2017-08-01 | Discharge: 2017-08-01 | Disposition: A | Discharge status: Home / Self Care | Payer: Commercial Managed Care - PPO | Attending: Family Medicine | Admitting: Family Medicine

## 2017-08-01 ENCOUNTER — Other Ambulatory Visit: Payer: Self-pay

## 2017-08-01 ENCOUNTER — Encounter: Payer: Self-pay | Admitting: Emergency Medicine

## 2017-08-01 DIAGNOSIS — H00011 Hordeolum externum right upper eyelid: Secondary | ICD-10-CM | POA: Diagnosis not present

## 2017-08-01 MED ORDER — POLYMYXIN B-TRIMETHOPRIM 10000-0.1 UNIT/ML-% OP SOLN: 1.0000 [drp] | Freq: Four times a day (QID) | OPHTHALMIC | 0 refills | Status: AC

## 2017-08-01 NOTE — Discharge Instructions (Signed)
If continues to persist, call [facility] for incision and drainage.  Take care  Dr. Lacinda Axon

## 2017-08-01 NOTE — ED Provider Notes (Signed)
[facility]    CSN: [id number] Arrival date & time: 08/01/17  1710  History   Chief Complaint Chief Complaint  Patient presents with  . Stye   HPI  47 year old male presents with the above complaints.  Patient states that he had a stye of his right upper eyelid for the past 2 weeks.  He states it is going on 3 weeks.  He is used over-the-counter eyedrops and warm compresses without relief.  No reports of conjunctival injection.  No drainage.  No vision changes.  No known exacerbating factors.  No other reported symptoms.  No other complaints.  Past Medical History:  Diagnosis Date  . Hypertension   . No pertinent past medical history    Patient Active Problem List   Diagnosis Date Noted  . Chest pain 03/09/2017   Past Surgical History:  Procedure Laterality Date  . CIRCUMCISION    . SHOULDER SURGERY  2014   Home Medications    Prior to Admission medications   Medication Sig Start Date End Date Taking? Authorizing Provider  aspirin EC 81 MG tablet Take 162 mg by mouth daily.   Yes [provider]  ibuprofen (ADVIL,MOTRIN) 200 MG tablet Take 200 mg by mouth every 6 (six) hours as needed for headache or mild pain.    Yes [provider]  lisinopril (PRINIVIL,ZESTRIL) 30 MG tablet Take 1 tablet (30 mg total) by mouth daily. 03/09/17 03/09/18 Yes Dustin Flock, MD  trimethoprim-polymyxin b (POLYTRIM) ophthalmic solution Place 1 drop into the right eye every 6 (six) hours for 7 days. 08/01/17 08/08/17  Coral Spikes, DO   Family History Family History  Problem Relation Age of Onset  . Breast cancer Mother   . Lung cancer Mother   . Cancer Maternal Uncle    Social History Social History   Tobacco Use  . Smoking status: Never Smoker  . Smokeless tobacco: Never Used  Substance Use Topics  . Alcohol use: No    Alcohol/week: 0.0 oz  . Drug use: No   Allergies   Patient has no known allergies.   Review of Systems Review of Systems    Constitutional: Negative.   Eyes: Negative for discharge and redness.       Stye.   Physical Exam Triage Vital Signs ED Triage Vitals  Enc Vitals Group     BP 08/01/17 1739 137/86     Pulse Rate 08/01/17 1739 91     Resp 08/01/17 1739 18     Temp 08/01/17 1739 98.8 F (37.1 C)     Temp Source 08/01/17 1739 Oral     SpO2 08/01/17 1739 98 %     Weight 08/01/17 1736 265 lb (120.2 kg)     Height 08/01/17 1736 6' (1.829 m)     Head Circumference --      Peak Flow --      Pain Score 08/01/17 1736 0     Pain Loc --      Pain Edu? --      Excl. in [place]? --    Updated Vital Signs BP 137/86 (BP Location: Left Arm)   Pulse 91   Temp 98.8 F (37.1 C) (Oral)   Resp 18   Ht 6' (1.829 m)   Wt 265 lb (120.2 kg)   SpO2 98%   BMI 35.94 kg/m     Physical Exam  Constitutional: He is oriented to person, place, and time. He appears well-developed. No distress.  HENT:  Head: Normocephalic and atraumatic.  Eyes: Conjunctivae are normal. Right eye exhibits no discharge. Left eye exhibits no discharge.  Right upper eyelid swelling lateral aspect consistent with a hordeolum.  Pulmonary/Chest: Effort normal. No respiratory distress.  Neurological: He is alert and oriented to person, place, and time.  Psychiatric: He has a normal mood and affect. His behavior is normal.  Nursing note and vitals reviewed.  UC Treatments / Results  Labs (all labs ordered are listed, but only abnormal results are displayed) Labs Reviewed - No data to display  EKG None  Radiology No results found.  Procedures Procedures (including critical care time)  Medications Ordered in UC Medications - No data to display  Initial Impression / Assessment and Plan / UC Course  I have reviewed the triage vital signs and the nursing notes.  Pertinent labs & imaging results that were available during my care of the patient were reviewed by me and considered in my medical decision making (see chart for details).     47 year old male presents with a persistent hordeolum.  Placing on antibiotic eyedrops.  Advised continued warm compresses.  If persists, will need to contact ophthalmology for incision and drainage.  Final Clinical Impressions(s) / UC Diagnoses   Final diagnoses:  Hordeolum externum of right upper eyelid     Discharge Instructions     If continues to persist, call [facility] for incision and drainage.  Take care  Dr. Lacinda Axon    ED Prescriptions    Medication Sig Dispense Auth. Provider   trimethoprim-polymyxin b (POLYTRIM) ophthalmic solution Place 1 drop into the right eye every 6 (six) hours for 7 days. 10 mL Coral Spikes, DO     Controlled Substance Prescriptions [place] Controlled Substance Registry consulted? Not Applicable   Coral Spikes, DO 08/01/17 1843

## 2017-08-01 NOTE — ED Triage Notes (Signed)
Patient c/o stye to right eye x 2 weeks. Has been using OTC eye drops with no relief.

## 2018-02-20 ENCOUNTER — Encounter: Payer: Self-pay | Admitting: Adult Health

## 2018-02-20 ENCOUNTER — Ambulatory Visit: Payer: Commercial Managed Care - PPO | Admitting: Adult Health

## 2018-02-20 VITALS — BP 146/98 | HR 86 | Resp 16 | Ht 72.0 in | Wt 288.2 lb

## 2018-02-20 DIAGNOSIS — I10 Essential (primary) hypertension: Secondary | ICD-10-CM

## 2018-02-20 DIAGNOSIS — N529 Male erectile dysfunction, unspecified: Secondary | ICD-10-CM | POA: Diagnosis not present

## 2018-02-20 DIAGNOSIS — R7989 Other specified abnormal findings of blood chemistry: Secondary | ICD-10-CM

## 2018-02-20 MED ORDER — LISINOPRIL 30 MG PO TABS: 30.0000 mg | ORAL_TABLET | Freq: Every day | ORAL | 2 refills | Status: DC

## 2018-02-20 NOTE — Patient Instructions (Signed)

## 2018-02-20 NOTE — Progress Notes (Signed)
[facility] [place], [place] [address]  Internal MEDICINE  Office Visit Note  Patient Name: Alexander Simon  [id number]  [id number]  Date of Service: 02/20/2018   Complaints/HPI Pt is here for establishment of PCP. Chief Complaint  Patient presents with  . New Patient (Initial Visit)    medication refills,    HPI Patient is here to establish care.  Patient reports that he was a patient of Dr. Brynda Greathouse who has retired recently.  He received a referral from Dr. Brynda Greathouse to another physician however never was scheduled.  He found [place] online and chose to join our practice.  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  He has 3 children and 1 grandchild.  He denies any tobacco, alcohol, or illicit drug use.  He reports he needs a few refills on his medications.  Current Medication: Outpatient Encounter Medications as of 02/20/2018  Medication Sig  . lisinopril (PRINIVIL,ZESTRIL) 30 MG tablet Take 1 tablet (30 mg total) by mouth daily.  . sildenafil (VIAGRA) 100 MG tablet Take 100 mg by mouth daily as needed.  . [DISCONTINUED] aspirin EC 81 MG tablet Take 162 mg by mouth daily.  . [DISCONTINUED] ibuprofen (ADVIL,MOTRIN) 200 MG tablet Take 200 mg by mouth every 6 (six) hours as needed for headache or mild pain.   . [DISCONTINUED] lisinopril (PRINIVIL,ZESTRIL) 30 MG tablet Take 1 tablet (30 mg total) by mouth daily.  . [DISCONTINUED] lisinopril (PRINIVIL,ZESTRIL) 30 MG tablet Take 1 tablet (30 mg total) by mouth daily.  . [DISCONTINUED] sildenafil (REVATIO) 20 MG tablet Take 20 mg by mouth daily as needed.   No facility-administered encounter medications on file as of 02/20/2018.     Surgical History: Past Surgical History:  Procedure Laterality Date  . CIRCUMCISION    . SHOULDER SURGERY  2014    Medical History: Past Medical History:  Diagnosis Date  . Hypertension   . No pertinent past medical history     Family History: Family  History  Problem Relation Age of Onset  . Breast cancer Mother   . Lung cancer Mother   . Cancer Maternal Uncle     Social History   Socioeconomic History  . Marital status: Divorced    Spouse name: Not on file  . Number of children: Not on file  . Years of education: Not on file  . Highest education level: Not on file  Occupational History  . Not on file  Social Needs  . Financial resource strain: Not on file  . Food insecurity:    Worry: Not on file    Inability: Not on file  . Transportation needs:    Medical: Not on file    Non-medical: Not on file  Tobacco Use  . Smoking status: Never Smoker  . Smokeless tobacco: Never Used  Substance and Sexual Activity  . Alcohol use: No    Alcohol/week: 0.0 standard drinks  . Drug use: No  . Sexual activity: Yes  Lifestyle  . Physical activity:    Days per week: Not on file    Minutes per session: Not on file  . Stress: Not on file  Relationships  . Social connections:    Talks on phone: Not on file    Gets together: Not on file    Attends religious service: Not on file    Active member of club or organization: Not on file    Attends meetings of clubs or organizations:  Not on file    Relationship status: Not on file  . Intimate partner violence:    Fear of current or ex partner: Not on file    Emotionally abused: Not on file    Physically abused: Not on file    Forced sexual activity: Not on file  Other Topics Concern  . Not on file  Social History Narrative  . Not on file     Review of Systems  Constitutional: Negative.  Negative for chills, fatigue and unexpected weight change.  HENT: Negative.  Negative for congestion, rhinorrhea, sneezing and sore throat.   Eyes: Negative for redness.  Respiratory: Negative.  Negative for cough, chest tightness and shortness of breath.   Cardiovascular: Negative.  Negative for chest pain and palpitations.  Gastrointestinal: Negative.  Negative for abdominal pain,  constipation, diarrhea, nausea and vomiting.  Endocrine: Negative.   Genitourinary: Negative.  Negative for dysuria and frequency.  Musculoskeletal: Negative.  Negative for arthralgias, back pain, joint swelling and neck pain.  Skin: Negative.  Negative for rash.  Allergic/Immunologic: Negative.   Neurological: Negative.  Negative for tremors and numbness.  Hematological: Negative for adenopathy. Does not bruise/bleed easily.  Psychiatric/Behavioral: Negative.  Negative for behavioral problems, sleep disturbance and suicidal ideas. The patient is not nervous/anxious.     Vital Signs: BP (!) 146/98 (BP Location: Right Arm, Patient Position: Sitting, Cuff Size: Large)   Pulse 86   Resp 16   Ht 6' (1.829 m)   Wt 288 lb 3.2 oz (130.7 kg)   SpO2 95%   BMI 39.09 kg/m    Physical Exam Vitals signs and nursing note reviewed.  Constitutional:      General: He is not in acute distress.    Appearance: He is well-developed. He is not diaphoretic.  HENT:     Head: Normocephalic and atraumatic.     Mouth/Throat:     Pharynx: No oropharyngeal exudate.  Eyes:     Pupils: Pupils are equal, round, and reactive to light.  Neck:     Musculoskeletal: Normal range of motion and neck supple.     Thyroid: No thyromegaly.     Vascular: No JVD.     Trachea: No tracheal deviation.  Cardiovascular:     Rate and Rhythm: Normal rate and regular rhythm.     Heart sounds: Normal heart sounds. No murmur. No friction rub. No gallop.   Pulmonary:     Effort: Pulmonary effort is normal. No respiratory distress.     Breath sounds: Normal breath sounds. No wheezing or rales.  Chest:     Chest wall: No tenderness.  Abdominal:     Palpations: Abdomen is soft.     Tenderness: There is no abdominal tenderness. There is no guarding.  Musculoskeletal: Normal range of motion.  Lymphadenopathy:     Cervical: No cervical adenopathy.  Skin:    General: Skin is warm and dry.  Neurological:     Mental Status:  He is alert and oriented to person, place, and time.     Cranial Nerves: No cranial nerve deficit.  Psychiatric:        Behavior: Behavior normal.        Thought Content: Thought content normal.        Judgment: Judgment normal.   Assessment/Plan: 1. Hypertension, unspecified type Patient's lisinopril refilled for him at this time for 90-day supply.  Patient reports good control of his blood pressure and denies any issues currently. - lisinopril (PRINIVIL,ZESTRIL) 30 MG  tablet; Take 1 tablet (30 mg total) by mouth daily.  Dispense: 90 tablet; Refill: 2  2. Low testosterone Patient has history of low testosterone.  He reports that he used to topical testosterone gel but had terrible side effects and no longer uses this.  His main complaint of low testosterone is erectile dysfunction which he is currently treating with sildenafil.  We will repeat patient's testosterone prolactin level at next blood draw physical 2 months.  3. Erectile dysfunction, unspecified erectile dysfunction type Patient's did not fill refill at this time given patient paper prescription with good Rx card so that he may get his pills multiple through [facility]. - sildenafil (REVATIO) 100 MG tablet; Take 50-100 mg by mouth daily as needed.  General Counseling: laquinn shippy understanding of the findings of todays visit and agrees with plan of treatment. I have discussed any further diagnostic evaluation that may be needed or ordered today. We also reviewed his medications today. he has been encouraged to call the office with any questions or concerns that should arise related to todays visit.  No orders of the defined types were placed in this encounter.   Meds ordered this encounter  Medications  . DISCONTD: lisinopril (PRINIVIL,ZESTRIL) 30 MG tablet    Sig: Take 1 tablet (30 mg total) by mouth daily.    Dispense:  90 tablet    Refill:  2  . lisinopril (PRINIVIL,ZESTRIL) 30 MG tablet    Sig: Take  1 tablet (30 mg total) by mouth daily.    Dispense:  90 tablet    Refill:  2    Time spent: 25 Minutes   This patient was seen by Orson Gear AGNP-C in Collaboration with Dr Lavera Guise as a part of collaborative care agreement  Kendell Bane AGNP-C Internal Medicine

## 2018-03-05 ENCOUNTER — Encounter: Payer: Self-pay | Admitting: Adult Health

## 2018-04-24 ENCOUNTER — Telehealth: Payer: Self-pay | Admitting: Adult Health

## 2018-04-24 NOTE — Telephone Encounter (Signed)
Pt advised we will give lab slip tomorrow at appointment

## 2018-04-25 ENCOUNTER — Encounter: Payer: Self-pay | Admitting: Adult Health

## 2018-04-28 ENCOUNTER — Encounter: Payer: Self-pay | Admitting: Adult Health

## 2018-05-02 ENCOUNTER — Ambulatory Visit (INDEPENDENT_AMBULATORY_CARE_PROVIDER_SITE_OTHER): Payer: Commercial Managed Care - PPO | Admitting: Adult Health

## 2018-05-02 ENCOUNTER — Other Ambulatory Visit: Payer: Self-pay

## 2018-05-02 ENCOUNTER — Encounter: Payer: Self-pay | Admitting: Adult Health

## 2018-05-02 VITALS — BP 142/82 | HR 103 | Resp 16 | Ht 72.0 in | Wt 281.0 lb

## 2018-05-02 DIAGNOSIS — E6609 Other obesity due to excess calories: Secondary | ICD-10-CM

## 2018-05-02 DIAGNOSIS — Z6838 Body mass index (BMI) 38.0-38.9, adult: Secondary | ICD-10-CM

## 2018-05-02 DIAGNOSIS — Z0001 Encounter for general adult medical examination with abnormal findings: Secondary | ICD-10-CM | POA: Diagnosis not present

## 2018-05-02 DIAGNOSIS — R7989 Other specified abnormal findings of blood chemistry: Secondary | ICD-10-CM | POA: Diagnosis not present

## 2018-05-02 DIAGNOSIS — I10 Essential (primary) hypertension: Secondary | ICD-10-CM | POA: Diagnosis not present

## 2018-05-02 DIAGNOSIS — R300 Dysuria: Secondary | ICD-10-CM

## 2018-05-02 DIAGNOSIS — N529 Male erectile dysfunction, unspecified: Secondary | ICD-10-CM | POA: Diagnosis not present

## 2018-05-02 NOTE — Progress Notes (Signed)
[facility] [place], [place] [address]  Internal MEDICINE  Office Visit Note  Patient Name: Alexander Simon  [id number]  [id number]  Date of Service: 05/02/2018  Chief Complaint  Patient presents with  . Annual Exam  . Hypertension     HPI Pt is here for routine health maintenance examination.  Pt is a well appearing 48 yo AA male.  He denies any current issues.  He has a history of HTN, and low testosterone. His BP is slightly elevated today at 142/82.  He reports he ate some steak this morning, and he believes this made his bp go up.  He denies tobacco, alcohol or illicit drug use.    Current Medication: Outpatient Encounter Medications as of 05/02/2018  Medication Sig  . b complex vitamins tablet Take 1 tablet by mouth daily.  Marland Kitchen lisinopril (PRINIVIL,ZESTRIL) 30 MG tablet Take 1 tablet (30 mg total) by mouth daily.  . sildenafil (VIAGRA) 100 MG tablet Take 100 mg by mouth daily as needed.   No facility-administered encounter medications on file as of 05/02/2018.     Surgical History: Past Surgical History:  Procedure Laterality Date  . CIRCUMCISION    . SHOULDER SURGERY  2014    Medical History: Past Medical History:  Diagnosis Date  . Hypertension   . No pertinent past medical history     Family History: Family History  Problem Relation Age of Onset  . Breast cancer Mother   . Lung cancer Mother   . Cancer Maternal Uncle       Review of Systems  Constitutional: Negative.  Negative for chills, fatigue and unexpected weight change.  HENT: Negative.  Negative for congestion, rhinorrhea, sneezing and sore throat.   Eyes: Negative for redness.  Respiratory: Negative.  Negative for cough, chest tightness and shortness of breath.   Cardiovascular: Negative.  Negative for chest pain and palpitations.  Gastrointestinal: Negative.  Negative for abdominal pain, constipation, diarrhea, nausea and vomiting.  Endocrine: Negative.    Genitourinary: Negative.  Negative for dysuria and frequency.  Musculoskeletal: Negative.  Negative for arthralgias, back pain, joint swelling and neck pain.  Skin: Negative.  Negative for rash.  Allergic/Immunologic: Negative.   Neurological: Negative.  Negative for tremors and numbness.  Hematological: Negative for adenopathy. Does not bruise/bleed easily.  Psychiatric/Behavioral: Negative.  Negative for behavioral problems, sleep disturbance and suicidal ideas. The patient is not nervous/anxious.      Vital Signs: BP (!) 142/82   Pulse (!) 103   Resp 16   Ht 6' (1.829 m)   Wt 281 lb (127.5 kg)   SpO2 97%   BMI 38.11 kg/m    Physical Exam Vitals signs and nursing note reviewed.  Constitutional:      General: He is not in acute distress.    Appearance: He is well-developed. He is not diaphoretic.  HENT:     Head: Normocephalic and atraumatic.     Mouth/Throat:     Pharynx: No oropharyngeal exudate.  Eyes:     Pupils: Pupils are equal, round, and reactive to light.  Neck:     Musculoskeletal: Normal range of motion and neck supple.     Thyroid: No thyromegaly.     Vascular: No JVD.     Trachea: No tracheal deviation.  Cardiovascular:     Rate and Rhythm: Normal rate and regular rhythm.     Heart sounds: Normal heart sounds. No murmur. No friction rub. No gallop.   Pulmonary:  Effort: Pulmonary effort is normal. No respiratory distress.     Breath sounds: Normal breath sounds. No wheezing or rales.  Chest:     Chest wall: No tenderness.  Abdominal:     Palpations: Abdomen is soft.     Tenderness: There is no abdominal tenderness. There is no guarding.  Musculoskeletal: Normal range of motion.  Lymphadenopathy:     Cervical: No cervical adenopathy.  Skin:    General: Skin is warm and dry.  Neurological:     Mental Status: He is alert and oriented to person, place, and time.     Cranial Nerves: No cranial nerve deficit.  Psychiatric:        Behavior:  Behavior normal.        Thought Content: Thought content normal.        Judgment: Judgment normal.      LABS: No results found for this or any previous visit (from the past 2160 hour(s)).  Assessment/Plan: 1. Encounter for general adult medical examination with abnormal findings Pt is up to date on PHM.  Gave lab slip to patient for physical labs as well as psa, prolactin and testosterone.  Will follow up in 4 weeks with patient for results.   2. Hypertension, unspecified type Slightly elevated today, pt had high salt/fat meal before coming to office.  Will recheck in 4 weeks at next visit.   3. Low testosterone History of low testosterone, will follow lab value and discuss at next visit.   4. Erectile dysfunction, unspecified erectile dysfunction type Stable, he reports good results with Viagra.  Will continue to use as directed.   5. Dysuria - UA/M w/rflx Culture, Routine  6. Class 2 obesity due to excess calories without serious comorbidity with body mass index (BMI) of 38.0 to 38.9 in adult Obesity Counseling: Risk Assessment: An assessment of behavioral risk factors was made today and includes lack of exercise sedentary lifestyle, lack of portion control and poor dietary habits.  Risk Modification Advice: She was counseled on portion control guidelines. Restricting daily caloric intake to. . The detrimental long term effects of obesity on her health and ongoing poor compliance was also discussed with the patient.    General Counseling: javari bufkin understanding of the findings of todays visit and agrees with plan of treatment. I have discussed any further diagnostic evaluation that may be needed or ordered today. We also reviewed his medications today. he has been encouraged to call the office with any questions or concerns that should arise related to todays visit.   Orders Placed This Encounter  Procedures  . UA/M w/rflx Culture, Routine    No orders of the  defined types were placed in this encounter.   Time spent: 25 Minutes   This patient was seen by Orson Gear AGNP-C in Collaboration with Dr Lavera Guise as a part of collaborative care agreement    Kendell Bane AGNP-C Internal Medicine

## 2018-05-02 NOTE — Patient Instructions (Signed)

## 2018-05-03 LAB — UA/M W/RFLX CULTURE, ROUTINE
Bilirubin, UA: NEGATIVE
Glucose, UA: NEGATIVE
Ketones, UA: NEGATIVE
Leukocytes, UA: NEGATIVE
Nitrite, UA: NEGATIVE
PH UA: 5 (ref 5.0–7.5)
Protein, UA: NEGATIVE
RBC, UA: NEGATIVE
Specific Gravity, UA: >=1.03 — AB (ref 1.005–1.030)
Urobilinogen, Ur: 0.2 mg/dL (ref 0.2–1.0)

## 2018-05-03 LAB — MICROSCOPIC EXAMINATION
CASTS: NONE SEEN /LPF
EPITHELIAL CELLS (NON RENAL): NONE SEEN /HPF (ref 0–10)

## 2018-05-05 ENCOUNTER — Other Ambulatory Visit: Payer: Self-pay | Admitting: Adult Health

## 2018-05-05 DIAGNOSIS — Z0001 Encounter for general adult medical examination with abnormal findings: Secondary | ICD-10-CM | POA: Diagnosis not present

## 2018-05-05 DIAGNOSIS — R7989 Other specified abnormal findings of blood chemistry: Secondary | ICD-10-CM | POA: Diagnosis not present

## 2018-05-05 DIAGNOSIS — I10 Essential (primary) hypertension: Secondary | ICD-10-CM | POA: Diagnosis not present

## 2018-05-06 LAB — CBC WITH DIFFERENTIAL/PLATELET
BASOS ABS: 0.1 10*3/uL (ref 0.0–0.2)
Basos: 1 %
EOS (ABSOLUTE): 0.2 10*3/uL (ref 0.0–0.4)
Eos: 2 %
HEMOGLOBIN: 13.5 g/dL (ref 13.0–17.7)
Hematocrit: 41.2 % (ref 37.5–51.0)
IMMATURE GRANS (ABS): 0.1 10*3/uL (ref 0.0–0.1)
IMMATURE GRANULOCYTES: 1 %
LYMPHS: 44 %
Lymphocytes Absolute: 3.3 10*3/uL — ABNORMAL HIGH (ref 0.7–3.1)
MCH: 25.1 pg — AB (ref 26.6–33.0)
MCHC: 32.8 g/dL (ref 31.5–35.7)
MCV: 77 fL — ABNORMAL LOW (ref 79–97)
MONOCYTES: 8 %
Monocytes Absolute: 0.6 10*3/uL (ref 0.1–0.9)
NEUTROS ABS: 3.3 10*3/uL (ref 1.4–7.0)
NEUTROS PCT: 44 %
Platelets: 268 10*3/uL (ref 150–450)
RBC: 5.37 x10E6/uL (ref 4.14–5.80)
RDW: 13.8 % (ref 11.6–15.4)
WBC: 7.4 10*3/uL (ref 3.4–10.8)

## 2018-05-06 LAB — COMPREHENSIVE METABOLIC PANEL
A/G RATIO: 1.5 (ref 1.2–2.2)
ALK PHOS: 70 IU/L (ref 39–117)
ALT: 44 IU/L (ref 0–44)
AST: 24 IU/L (ref 0–40)
Albumin: 4.5 g/dL (ref 4.0–5.0)
BUN / CREAT RATIO: 13 (ref 9–20)
BUN: 13 mg/dL (ref 6–24)
CHLORIDE: 102 mmol/L (ref 96–106)
CO2: 23 mmol/L (ref 20–29)
Calcium: 9.8 mg/dL (ref 8.7–10.2)
Creatinine, Ser: 1.01 mg/dL (ref 0.76–1.27)
GFR calc non Af Amer: 88 mL/min/{1.73_m2} (ref >59)
GFR, EST AFRICAN AMERICAN: 101 mL/min/{1.73_m2} (ref >59)
GLOBULIN, TOTAL: 3 g/dL (ref 1.5–4.5)
Glucose: 101 mg/dL — ABNORMAL HIGH (ref 65–99)
Potassium: 4.2 mmol/L (ref 3.5–5.2)
SODIUM: 141 mmol/L (ref 134–144)
TOTAL PROTEIN: 7.5 g/dL (ref 6.0–8.5)

## 2018-05-06 LAB — LIPID PANEL WITH LDL/HDL RATIO
Cholesterol, Total: 208 mg/dL — ABNORMAL HIGH (ref 100–199)
HDL: 40 mg/dL (ref >39)
LDL Calculated: 129 mg/dL — ABNORMAL HIGH (ref 0–99)
LDl/HDL Ratio: 3.2 ratio (ref 0.0–3.6)
Triglycerides: 196 mg/dL — ABNORMAL HIGH (ref 0–149)
VLDL CHOLESTEROL CAL: 39 mg/dL (ref 5–40)

## 2018-05-06 LAB — PROLACTIN: PROLACTIN: 11.6 ng/mL (ref 4.0–15.2)

## 2018-05-06 LAB — PSA: Prostate Specific Ag, Serum: 0.6 ng/mL (ref 0.0–4.0)

## 2018-05-06 LAB — HGB A1C W/O EAG: Hgb A1c MFr Bld: 6.3 % — ABNORMAL HIGH (ref 4.8–5.6)

## 2018-05-06 LAB — T4, FREE: FREE T4: 1.15 ng/dL (ref 0.82–1.77)

## 2018-05-06 LAB — TESTOSTERONE: TESTOSTERONE: 281 ng/dL (ref 264–916)

## 2018-05-06 LAB — TSH: TSH: 2.35 u[IU]/mL (ref 0.450–4.500)

## 2018-05-30 ENCOUNTER — Ambulatory Visit: Payer: Commercial Managed Care - PPO | Admitting: Adult Health

## 2018-05-30 ENCOUNTER — Other Ambulatory Visit: Payer: Self-pay

## 2018-05-30 ENCOUNTER — Encounter: Payer: Self-pay | Admitting: Adult Health

## 2018-05-30 VITALS — Resp 16 | Ht 72.0 in | Wt 281.0 lb

## 2018-05-30 DIAGNOSIS — E7849 Other hyperlipidemia: Secondary | ICD-10-CM | POA: Diagnosis not present

## 2018-05-30 DIAGNOSIS — I10 Essential (primary) hypertension: Secondary | ICD-10-CM

## 2018-05-30 DIAGNOSIS — N529 Male erectile dysfunction, unspecified: Secondary | ICD-10-CM | POA: Diagnosis not present

## 2018-05-30 MED ORDER — SILDENAFIL CITRATE 100 MG PO TABS: 100.0000 mg | ORAL_TABLET | Freq: Every day | ORAL | 1 refills | Status: DC | PRN

## 2018-05-30 NOTE — Progress Notes (Signed)
[facility] [place], [place] [address]  Internal MEDICINE  Telephone Visit  Patient Name: Alexander Simon  [id number]  [id number]  Date of Service: 06/02/2018  I connected with the patient at 1030  by telephone and verified the patients identity using two identifiers. I discussed the limitations, risks, security and privacy concerns of performing an evaluation and management service by telephone and the availability of in person appointments. I also discussed with the patient that there may be a patient responsible charge related to the service.  The patient expressed understanding and agrees to proceed.    Chief Complaint  Patient presents with  . Telephone Screen    4 week follow up of labs   . Hypertension    HPI  Pt lab results discussed. He reports his blood pressure has been well controlled at this time.  Denies Chest pain, Shortness of breath, palpitations, headache, or blurred vision. He is requesting refill on his viagra.  He reports the current dose is working well for him, and he has good results.     Current Medication: Outpatient Encounter Medications as of 05/30/2018  Medication Sig  . b complex vitamins tablet Take 1 tablet by mouth daily.  Marland Kitchen lisinopril (PRINIVIL,ZESTRIL) 30 MG tablet Take 1 tablet (30 mg total) by mouth daily.  . sildenafil (VIAGRA) 100 MG tablet Take 1 tablet (100 mg total) by mouth daily as needed for erectile dysfunction.  . [DISCONTINUED] sildenafil (VIAGRA) 100 MG tablet Take 100 mg by mouth daily as needed.  . [DISCONTINUED] sildenafil (VIAGRA) 100 MG tablet Take 1 tablet (100 mg total) by mouth daily as needed for erectile dysfunction.   No facility-administered encounter medications on file as of 05/30/2018.     Surgical History: Past Surgical History:  Procedure Laterality Date  . CIRCUMCISION    . SHOULDER SURGERY  2014    Medical History: Past Medical History:  Diagnosis Date  . Hypertension   . No pertinent  past medical history     Family History: Family History  Problem Relation Age of Onset  . Breast cancer Mother   . Lung cancer Mother   . Cancer Maternal Uncle     Social History   Socioeconomic History  . Marital status: Divorced    Spouse name: Not on file  . Number of children: Not on file  . Years of education: Not on file  . Highest education level: Not on file  Occupational History  . Not on file  Social Needs  . Financial resource strain: Not on file  . Food insecurity:    Worry: Not on file    Inability: Not on file  . Transportation needs:    Medical: Not on file    Non-medical: Not on file  Tobacco Use  . Smoking status: Never Smoker  . Smokeless tobacco: Never Used  Substance and Sexual Activity  . Alcohol use: No    Alcohol/week: 0.0 standard drinks  . Drug use: No  . Sexual activity: Yes  Lifestyle  . Physical activity:    Days per week: Not on file    Minutes per session: Not on file  . Stress: Not on file  Relationships  . Social connections:    Talks on phone: Not on file    Gets together: Not on file    Attends religious service: Not on file    Active member of club or organization: Not on file    Attends meetings of clubs  or organizations: Not on file    Relationship status: Not on file  . Intimate partner violence:    Fear of current or ex partner: Not on file    Emotionally abused: Not on file    Physically abused: Not on file    Forced sexual activity: Not on file  Other Topics Concern  . Not on file  Social History Narrative  . Not on file      Review of Systems  Constitutional: Negative.  Negative for chills, fatigue and unexpected weight change.  HENT: Negative.  Negative for congestion, rhinorrhea, sneezing and sore throat.   Eyes: Negative for redness.  Respiratory: Negative.  Negative for cough, chest tightness and shortness of breath.   Cardiovascular: Negative.  Negative for chest pain and palpitations.   Gastrointestinal: Negative.  Negative for abdominal pain, constipation, diarrhea, nausea and vomiting.  Endocrine: Negative.   Genitourinary: Negative.  Negative for dysuria and frequency.  Musculoskeletal: Negative.  Negative for arthralgias, back pain, joint swelling and neck pain.  Skin: Negative.  Negative for rash.  Allergic/Immunologic: Negative.   Neurological: Negative.  Negative for tremors and numbness.  Hematological: Negative for adenopathy. Does not bruise/bleed easily.  Psychiatric/Behavioral: Negative.  Negative for behavioral problems, sleep disturbance and suicidal ideas. The patient is not nervous/anxious.     Vital Signs: Resp 16   Ht 6' (1.829 m)   Wt 281 lb (127.5 kg)   BMI 38.11 kg/m    Observation/Objective: Pt doing well, denies complaints.  Calm and cooperative.    Assessment/Plan: 1. Erectile dysfunction, unspecified erectile dysfunction type Refilled viagra.  Once again discussed safe use of medication.  - sildenafil (VIAGRA) 100 MG tablet; Take 1 tablet (100 mg total) by mouth daily as needed for erectile dysfunction.  Dispense: 30 tablet; Refill: 1  2. Hypertension, unspecified type Stable, pt denies any issues.    3. Other hyperlipidemia We discussed his elevated cholesterol level on his lab work.  He would like to try dietary modifications before starting a medication.   General Counseling: keenen roessner understanding of the findings of today's phone visit and agrees with plan of treatment. I have discussed any further diagnostic evaluation that may be needed or ordered today. We also reviewed his medications today. he has been encouraged to call the office with any questions or concerns that should arise related to todays visit.    No orders of the defined types were placed in this encounter.   Meds ordered this encounter  Medications  . DISCONTD: sildenafil (VIAGRA) 100 MG tablet    Sig: Take 1 tablet (100 mg total) by mouth daily  as needed for erectile dysfunction.    Dispense:  30 tablet    Refill:  1  . sildenafil (VIAGRA) 100 MG tablet    Sig: Take 1 tablet (100 mg total) by mouth daily as needed for erectile dysfunction.    Dispense:  30 tablet    Refill:  1    PT has Good RX    Time spent: Hughes AGNP-C Internal medicine

## 2018-10-03 ENCOUNTER — Ambulatory Visit: Payer: Commercial Managed Care - PPO | Admitting: Adult Health

## 2019-02-10 ENCOUNTER — Other Ambulatory Visit: Payer: Self-pay | Admitting: Adult Health

## 2019-02-10 DIAGNOSIS — I10 Essential (primary) hypertension: Secondary | ICD-10-CM

## 2019-04-30 ENCOUNTER — Telehealth: Payer: Self-pay

## 2019-04-30 NOTE — Telephone Encounter (Signed)
Confirmed appointment on 05/04/2019 and screened for covid. klh °

## 2019-05-04 ENCOUNTER — Encounter: Payer: Self-pay | Admitting: Adult Health

## 2019-05-04 ENCOUNTER — Other Ambulatory Visit: Payer: Self-pay

## 2019-05-04 ENCOUNTER — Ambulatory Visit (INDEPENDENT_AMBULATORY_CARE_PROVIDER_SITE_OTHER): Payer: Commercial Managed Care - PPO | Admitting: Adult Health

## 2019-05-04 VITALS — BP 158/98 | HR 87 | Temp 97.8°F | Resp 16 | Ht 72.0 in | Wt 294.8 lb

## 2019-05-04 DIAGNOSIS — R7989 Other specified abnormal findings of blood chemistry: Secondary | ICD-10-CM

## 2019-05-04 DIAGNOSIS — Z113 Encounter for screening for infections with a predominantly sexual mode of transmission: Secondary | ICD-10-CM

## 2019-05-04 DIAGNOSIS — I10 Essential (primary) hypertension: Secondary | ICD-10-CM

## 2019-05-04 DIAGNOSIS — R221 Localized swelling, mass and lump, neck: Secondary | ICD-10-CM | POA: Diagnosis not present

## 2019-05-04 DIAGNOSIS — N529 Male erectile dysfunction, unspecified: Secondary | ICD-10-CM

## 2019-05-04 DIAGNOSIS — R300 Dysuria: Secondary | ICD-10-CM

## 2019-05-04 DIAGNOSIS — Z0001 Encounter for general adult medical examination with abnormal findings: Secondary | ICD-10-CM | POA: Diagnosis not present

## 2019-05-04 DIAGNOSIS — Z125 Encounter for screening for malignant neoplasm of prostate: Secondary | ICD-10-CM

## 2019-05-04 MED ORDER — SILDENAFIL CITRATE 100 MG PO TABS: 100.0000 mg | ORAL_TABLET | Freq: Every day | ORAL | 0 refills | Status: DC | PRN

## 2019-05-04 NOTE — Progress Notes (Signed)
[facility] [place], [place] [address]  Internal MEDICINE  Office Visit Note  Patient Name: Alexander Simon  [id number]  [id number]  Date of Service: 05/04/2019  Chief Complaint  Patient presents with  . Annual Exam  . Hypertension     HPI Pt is here for routine health maintenance examination.  Pt  Is a 49yo overweight AA male.  He has a history of HTN, and ED.  He reports overall he is doing well.  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  He has not been home to take his bp medication yet today. He does report having some increased episodes of anxiety.  He thinks he may need to start on some medication, as he is having one or two episodes each week.   Current Medication: Outpatient Encounter Medications as of 05/04/2019  Medication Sig  . b complex vitamins tablet Take 1 tablet by mouth daily.  Marland Kitchen lisinopril (ZESTRIL) 30 MG tablet TAKE 1 TABLET BY MOUTH EVERY DAY  . sildenafil (VIAGRA) 100 MG tablet Take 1 tablet (100 mg total) by mouth daily as needed for erectile dysfunction.  . [DISCONTINUED] sildenafil (VIAGRA) 100 MG tablet Take 1 tablet (100 mg total) by mouth daily as needed for erectile dysfunction.   No facility-administered encounter medications on file as of 05/04/2019.    Surgical History: Past Surgical History:  Procedure Laterality Date  . CIRCUMCISION    . SHOULDER SURGERY  2014    Medical History: Past Medical History:  Diagnosis Date  . Hypertension   . No pertinent past medical history     Family History: Family History  Problem Relation Age of Onset  . Breast cancer Mother   . Lung cancer Mother   . Cancer Maternal Uncle       Review of Systems  Constitutional: Negative.  Negative for chills, fatigue and unexpected weight change.  HENT: Negative.  Negative for congestion, rhinorrhea, sneezing and sore throat.   Eyes: Negative for redness.  Respiratory: Negative.   Negative for cough, chest tightness and shortness of breath.   Cardiovascular: Negative.  Negative for chest pain and palpitations.  Gastrointestinal: Negative.  Negative for abdominal pain, constipation, diarrhea, nausea and vomiting.  Endocrine: Negative.   Genitourinary: Negative.  Negative for dysuria and frequency.  Musculoskeletal: Negative.  Negative for arthralgias, back pain, joint swelling and neck pain.  Skin: Negative.  Negative for rash.  Allergic/Immunologic: Negative.   Neurological: Negative.  Negative for tremors and numbness.  Hematological: Negative for adenopathy. Does not bruise/bleed easily.  Psychiatric/Behavioral: Negative.  Negative for behavioral problems, sleep disturbance and suicidal ideas. The patient is not nervous/anxious.      Vital Signs: BP (!) 158/98   Pulse 87   Temp 97.8 F (36.6 C)   Resp 16   Ht 6' (1.829 m)   Wt 294 lb 12.8 oz (133.7 kg)   SpO2 100%   BMI 39.98 kg/m    Physical Exam Vitals and nursing note reviewed.  Constitutional:      General: He is not in acute distress.    Appearance: He is well-developed. He is not diaphoretic.  HENT:     Head: Normocephalic and atraumatic.     Mouth/Throat:     Pharynx: No oropharyngeal exudate.  Eyes:     Pupils: Pupils are equal, round, and reactive to light.  Neck:     Thyroid: No thyromegaly.  Vascular: No JVD.     Trachea: No tracheal deviation.  Cardiovascular:     Rate and Rhythm: Normal rate and regular rhythm.     Heart sounds: Normal heart sounds. No murmur. No friction rub. No gallop.   Pulmonary:     Effort: Pulmonary effort is normal. No respiratory distress.     Breath sounds: Normal breath sounds. No wheezing or rales.  Chest:     Chest wall: No tenderness.  Abdominal:     Palpations: Abdomen is soft.     Tenderness: There is no abdominal tenderness. There is no guarding.  Musculoskeletal:        General: Normal range of motion.     Cervical back: Normal range of  motion and neck supple.  Lymphadenopathy:     Cervical: No cervical adenopathy.  Skin:    General: Skin is warm and dry.  Neurological:     Mental Status: He is alert and oriented to person, place, and time.     Cranial Nerves: No cranial nerve deficit.  Psychiatric:        Behavior: Behavior normal.        Thought Content: Thought content normal.        Judgment: Judgment normal.      LABS: No results found for this or any previous visit (from the past 2160 hour(s)).    Assessment/Plan: 1. Encounter for general adult medical examination with abnormal findings Discussed upcoming 49th birthday, He will need colonoscopy.  He is not ready to commit yet, will discuss at follow up.  - CBC with Differential/Platelet - Lipid Panel With LDL/HDL Ratio - TSH - T4, free - Comprehensive metabolic panel  2. Hypertension, unspecified type Elevated today, has not taken his medication today.   3. Erectile dysfunction, unspecified erectile dysfunction type Discussed safety and side effects.  Patient  - sildenafil (VIAGRA) 100 MG tablet; Take 1 tablet (100 mg total) by mouth daily as needed for erectile dysfunction.  Dispense: 90 tablet; Refill: 0  4. Neck mass Previously evaluated, and biopsied.  Is ready to discuss removal.  - Ambulatory referral to General Surgery  5. Low testosterone - Testosterone,Free and Total - Prolactin  6. Screening for prostate cancer - PSA  7. Routine screening for STI (sexually transmitted infection) Gonorrhea, chylamdia, Trich sent via urine - HIV antibody (with reflex) - Hepatitis c antibody (reflex) - RPR - HSV(herpes simplex vrs) 1+2 ab-IgG  General Counseling: Toshiyuki verbalizes understanding of the findings of todays visit and agrees with plan of treatment. I have discussed any further diagnostic evaluation that may be needed or ordered today. We also reviewed his medications today. he has been encouraged to call the office with any questions  or concerns that should arise related to todays visit.   Orders Placed This Encounter  Procedures  . Chlamydia/Gonococcus/Trichomonas, NAA  . CBC with Differential/Platelet  . Lipid Panel With LDL/HDL Ratio  . TSH  . T4, free  . Comprehensive metabolic panel  . PSA  . Testosterone,Free and Total  . Prolactin  . HIV antibody (with reflex)  . Hepatitis c antibody (reflex)  . RPR  . HSV(herpes simplex vrs) 1+2 ab-IgG  . UA/M w/rflx Culture, Routine  . Ambulatory referral to General Surgery    Meds ordered this encounter  Medications  . sildenafil (VIAGRA) 100 MG tablet    Sig: Take 1 tablet (100 mg total) by mouth daily as needed for erectile dysfunction.    Dispense:  90 tablet  Refill:  0    PT has [facility]IN [id number], PCn [id number], Group [id number], Member ID [id number]    Time spent: 25 Minutes   This patient was seen by Orson Gear AGNP-C in Collaboration with Dr Lavera Guise as a part of collaborative care agreement    Kendell Bane AGNP-C Internal Medicine

## 2019-05-05 ENCOUNTER — Ambulatory Visit (INDEPENDENT_AMBULATORY_CARE_PROVIDER_SITE_OTHER): Payer: Commercial Managed Care - PPO

## 2019-05-05 ENCOUNTER — Encounter: Payer: Self-pay | Admitting: General Surgery

## 2019-05-05 ENCOUNTER — Other Ambulatory Visit: Payer: Self-pay

## 2019-05-05 ENCOUNTER — Ambulatory Visit (INDEPENDENT_AMBULATORY_CARE_PROVIDER_SITE_OTHER): Payer: Self-pay | Admitting: General Surgery

## 2019-05-05 VITALS — BP 175/96 | HR 101 | Temp 97.7°F | Resp 12 | Ht 72.0 in | Wt 293.6 lb

## 2019-05-05 DIAGNOSIS — E049 Nontoxic goiter, unspecified: Secondary | ICD-10-CM | POA: Diagnosis not present

## 2019-05-05 NOTE — Patient Instructions (Addendum)
Our surgery scheduler will contact you to schedule your surgery. Please have the BLUE SHEET available when she calls you.   Goiter  A goiter is an enlarged thyroid gland. The thyroid is located in the lower front of the neck. It makes hormones that affect many body parts and systems, including the system that affects how quickly the body burns fuel for energy (metabolism). Most goiters are painless and are not a cause for concern. Some goiters can affect the way your thyroid makes thyroid hormones. Goiters and conditions that cause goiters can be treated, if necessary. What are the causes? Common causes of this condition include:  Lack (deficiency) of a mineral called iodine. The thyroid gland uses iodine to make thyroid hormones.  Diseases that attack healthy cells in the body (autoimmune diseases) and affect thyroid function, such as Graves' disease or Hashimoto's disease. These diseases may cause the body to produce too much thyroid hormone (hyperthyroidism) or too little of the hormone (hypothyroidism).  Conditions that cause inflammation of the thyroid (thyroiditis).  One or more small growths on the thyroid (nodular goiter). Other causes include:  Medical problems caused by abnormal genes that are passed from parent to child (genetic defects).  Thyroid injury or infection.  Tumors that may or may not be cancerous.  Pregnancy.  Certain medicines.  Exposure to radiation. In some cases, the cause may not be known. What increases the risk? This condition is more likely to develop in:  People who do not get enough iodine in their diet.  People who have a family history of goiter.  Women.  People who are older than age 37.  People who smoke tobacco.  People who have had exposure to radiation. What are the signs or symptoms? The main symptom of this condition is swelling in the lower, front part of the neck. This swelling can range from a very small bump to a large lump.  Other symptoms may include:  A tight feeling in the throat.  A hoarse voice.  Coughing.  Wheezing.  Difficulty swallowing or breathing.  Bulging veins in the neck.  Dizziness. When a goiter is the result of an overactive thyroid (hyperthyroidism), symptoms may also include:  Nervousness or restlessness.  Inability to tolerate heat.  Unexplained weight loss.  Diarrhea.  Change in the texture of hair or skin.  Changes in heartbeat, such as skipped beats, extra beats, or a rapid heart rate.  Loss of menstruation.  Shaky hands.  Increased appetite.  Sleep problems. When a goiter is the result of an underactive thyroid (hypothyroidism), symptoms may also include:  Feeling like you have no energy (lethargy).  Inability to tolerate cold.  Weight gain that is not explained by a change in diet or exercise habits.  Dry skin.  Coarse hair.  Irregular menstrual periods.  Constipation.  Sadness or depression.  Fatigue. In some cases, there may not be any symptoms and the thyroid hormone levels may be normal. How is this diagnosed? This condition may be diagnosed based on your symptoms, your medical history, and a physical exam. You may have tests, such as:  Blood tests to check thyroid function.  Imaging tests, such as: ? Ultrasound. ? CT scan. ? MRI. ? Thyroid scan.  Removal of a tissue sample (biopsy) of the goiter or any nodules. The sample will be tested to check for cancer. How is this treated? Treatment for this condition depends on the cause and your symptoms. Treatment may include:  Medicines to regulate thyroid  hormone levels.  Anti-inflammatory medicines or steroid medicines, if the goiter is caused by inflammation.  Iodine supplements or changes to your diet, if the goiter is caused by iodine deficiency.  Radioactive iodine treatment.  Surgery to remove your thyroid. In some cases, you may only need regular check-ups with your health care  provider to monitor your condition, and you may not need treatment. Follow these instructions at home:  Follow instructions from your health care provider about any changes to your diet.  Take over-the-counter and prescription medicines only as told by your health care provider. These include supplements.  Do not use any products that contain nicotine or tobacco, such as cigarettes and e-cigarettes. If you need help quitting, ask your health care provider.  Keep all follow-up visits as told by your health care provider. This is important. Contact a health care provider if:  Your symptoms do not get better with treatment.  You have nausea, vomiting, or diarrhea. Get help right away if:  You have sudden, unexplained confusion or other mental changes.  You have a fever.  You have chest pain.  You have trouble breathing or swallowing.  You suddenly become very weak.  You experience extreme restlessness.  You feel your heart racing. Summary  A goiter is an enlarged thyroid gland.  The thyroid gland is located in the lower front of the neck. It makes hormones that affect many body parts and systems, including the system that affects how quickly the body burns fuel for energy (metabolism).  The main symptom of this condition is swelling in the lower, front part of the neck. This swelling can range from a very small bump to a large lump.  Treatment for this condition depends on the cause and your symptoms. You may need medicines, supplements, or regular monitoring of your condition. This information is not intended to replace advice given to you by your health care provider. Make sure you discuss any questions you have with your health care provider. Document Revised: 01/25/2017 Document Reviewed: 11/08/2016 Elsevier Patient Education  2020 [employer].

## 2019-05-06 LAB — CHLAMYDIA/GONOCOCCUS/TRICHOMONAS, NAA
Chlamydia by NAA: NEGATIVE
Gonococcus by NAA: NEGATIVE
Trich vag by NAA: NEGATIVE

## 2019-05-06 NOTE — Progress Notes (Signed)
Patient ID: Alexander Simon, male   DOB: [birth year], 49 y.o.   MRN: [id number]  Chief Complaint  Patient presents with  . New Patient (Initial Visit)    Neck mass R side     HPI Alexander Simon is a 49 y.o. male.   He was seen in 2016 by Dr. Jamal Collin.  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  No glandular cells were appreciated.  He had been scheduled for surgical resection, however a series of life advised has a delayed this.  He is here today to discuss surgical removal of the mass.  He states that since he was last seen, he feels like it has increased in size.  He denies any pain associated with the mass.  No fevers or chills.  No lateral compressive symptoms, but he does endorse some difficulty swallowing, primarily first thing in the morning when he wakes up.  The mass has never been red, hot, tender, nor drained any fluid or other material.  The increase in size and the cosmetic aspect of it are most troublesome to him.  He also has multiple skin tags on his neck that rub against his clothing that he would like addressed.   Past Medical History:  Diagnosis Date  . Hypertension   . No pertinent past medical history     Past Surgical History:  Procedure Laterality Date  . CIRCUMCISION    . SHOULDER SURGERY  2014    Family History  Problem Relation Age of Onset  . Breast cancer Mother   . Lung cancer Mother   . Cancer Maternal Uncle     Social History Social History   Tobacco Use  . Smoking status: Never Smoker  . Smokeless tobacco: Never Used  Substance Use Topics  . Alcohol use: No    Alcohol/week: 0.0 standard drinks  . Drug use: No    No Known Allergies  Current Outpatient Medications  Medication Sig Dispense Refill  . b complex vitamins tablet Take 1 tablet by mouth daily.    Marland Kitchen lisinopril (ZESTRIL) 30 MG tablet TAKE 1 TABLET BY MOUTH EVERY DAY 90 tablet 0  . sildenafil (VIAGRA) 100 MG tablet Take 1  tablet (100 mg total) by mouth daily as needed for erectile dysfunction. 90 tablet 0   No current facility-administered medications for this visit.    Review of Systems Review of Systems  All other systems reviewed and are negative.   Blood pressure (!) 175/96, pulse (!) 101, temperature 97.7 F (36.5 C), resp. rate 12, height 6' (1.829 m), weight 293 lb 9.6 oz (133.2 kg), SpO2 96 %.  Physical Exam Physical Exam Vitals reviewed.  Constitutional:      General: He is not in acute distress.    Appearance: Normal appearance. He is obese.  HENT:     Head: Normocephalic and atraumatic.     Nose:     Comments: Covered with a mask secondary to COVID-19 precautions    Mouth/Throat:     Comments: Covered with a mask secondary to COVID-19 precautions Eyes:     General: No scleral icterus.       Right eye: No discharge.        Left eye: No discharge.     Conjunctiva/sclera: Conjunctivae normal.  Neck:     Thyroid: Thyromegaly present.     Trachea: Trachea normal. No tracheal deviation.      Comments: There is an  approximately 3 to 4 cm well-circumscribed soft mobile mass just anterior to the right earlobe.  It feels like it is within the subcutaneous tissues.  It is not tender to manipulation.  Thyroid is significantly enlarged.  I do not appreciate any discretely palpable nodules.  The gland does move freely with deglutition and elevate above the level of the clavicles.  Multiple small skin tags present Cardiovascular:     Rate and Rhythm: Normal rate and regular rhythm.     Pulses: Normal pulses.  Pulmonary:     Effort: Pulmonary effort is normal.     Breath sounds: Normal breath sounds.  Abdominal:     General: Bowel sounds are normal.     Palpations: Abdomen is soft.     Hernia: A hernia is present.     Comments: A reducible umbilical is present.  Genitourinary:    Comments: Deferred Musculoskeletal:        General: No swelling or tenderness.  Lymphadenopathy:      Cervical: No cervical adenopathy.  Skin:    General: Skin is warm and dry.  Neurological:     General: No focal deficit present.     Mental Status: He is alert and oriented to person, place, and time.  Psychiatric:        Mood and Affect: Mood normal.        Behavior: Behavior normal.        Thought Content: Thought content normal.     Data Reviewed I reviewed the imaging on two in-office focused ultrasounds performed by Dr. Jamal Collin.  These show a 2 x 3 cm well-circumscribed solid structure that does not have the imaging characteristics consistent with salivary gland tissue; it appears more fibrous/adipose, which would be consistent with the core biopsy findings, and discussed above in the history of present illness.  Assessment This is a 49 year-old man with a mass on the right side of his neck.  It has been previously biopsied and found to be benign, however it is growing and becoming more bothersome to him.  He would like to have it removed.  He also has multiple skin tags.  In addition, he had fairly marked thyromegaly.  I performed an ultrasound in clinic today.  See separate documentation for full details.  Briefly, however, he has a multinodular goiter which may account for some of his described dysphagia.  He denies any symptoms of hyper or hypothyroidism and none of the nodules meet criteria for biopsy.   He has a reducible umbilical hernia that is not causing concern at this time.  Plan We will proceed with surgical resection of the mass on his neck.  I will also remove the bothersome skin tags at that time.  I discussed the risks of the procedure with him.  These include, but are not limited to, bleeding, infection, damage to surrounding tissues or structures, recurrence.  He has accepted these risks and wishes to proceed.  I will proceed with further evaluation and management of his thyroid nodules after we surgically address the neck mass.  He would like to have his hernia  repaired after he recovers from the neck mass excision.    Fredirick Maudlin 05/06/2019, 9:46 AM

## 2019-05-13 ENCOUNTER — Telehealth: Payer: Self-pay | Admitting: General Surgery

## 2019-05-13 LAB — UA/M W/RFLX CULTURE, ROUTINE

## 2019-05-13 LAB — SPECIMEN STATUS REPORT

## 2019-05-13 NOTE — Telephone Encounter (Signed)
Outbound call made & message left requesting a call back to review the following info:  Surgery Date: 06/12/19 Preadmission Testing Date: 06/08/19 (phone 8a-1p) Covid Testing Date: 06/10/19 - patient advised to go to [facility] ([place])  Also, please remind the pt to call [phone], between 1-3:00pm the day before surgery, to find out what time to arrive.    Thank you

## 2019-05-22 ENCOUNTER — Other Ambulatory Visit: Payer: Commercial Managed Care - PPO

## 2019-05-27 ENCOUNTER — Telehealth: Payer: Self-pay

## 2019-05-27 NOTE — Telephone Encounter (Signed)
Confirmed appointment on 06/01/2019 and screened for covid. [facility] 

## 2019-05-28 ENCOUNTER — Other Ambulatory Visit: Payer: Commercial Managed Care - PPO

## 2019-06-01 ENCOUNTER — Encounter: Payer: Self-pay | Admitting: Adult Health

## 2019-06-01 ENCOUNTER — Other Ambulatory Visit: Payer: Self-pay

## 2019-06-01 ENCOUNTER — Ambulatory Visit: Payer: Commercial Managed Care - PPO | Admitting: Adult Health

## 2019-06-01 VITALS — BP 155/95 | HR 76 | Temp 97.8°F | Resp 16 | Ht 72.0 in | Wt 295.4 lb

## 2019-06-01 DIAGNOSIS — E7849 Other hyperlipidemia: Secondary | ICD-10-CM

## 2019-06-01 DIAGNOSIS — I10 Essential (primary) hypertension: Secondary | ICD-10-CM | POA: Diagnosis not present

## 2019-06-01 DIAGNOSIS — R221 Localized swelling, mass and lump, neck: Secondary | ICD-10-CM | POA: Diagnosis not present

## 2019-06-01 DIAGNOSIS — R7303 Prediabetes: Secondary | ICD-10-CM | POA: Diagnosis not present

## 2019-06-01 NOTE — Progress Notes (Signed)
[facility] [place], [place] [address]  Internal MEDICINE  Office Visit Note  Patient Name: Alexander Simon  [id number]  [id number]  Date of Service: 06/01/2019  Chief Complaint  Patient presents with  . Follow-up    review labs    HPI  Pt is here for follow up on labs.  His cholesterol is improving, his Triglycerides have decreased from 244 to 196.  He is watching his diet, and cutting down on carbohydrates.  His total cholesterol is 208. His A1C increased from 6.1 to 6.3 in a year. He denies any new or worse symptoms at this time.     Current Medication: Outpatient Encounter Medications as of 06/01/2019  Medication Sig  . lisinopril (ZESTRIL) 30 MG tablet TAKE 1 TABLET BY MOUTH EVERY DAY (Patient taking differently: Take 30 mg by mouth daily. )  . sildenafil (VIAGRA) 100 MG tablet Take 1 tablet (100 mg total) by mouth daily as needed for erectile dysfunction.   No facility-administered encounter medications on file as of 06/01/2019.    Surgical History: Past Surgical History:  Procedure Laterality Date  . CIRCUMCISION    . SHOULDER SURGERY  2014    Medical History: Past Medical History:  Diagnosis Date  . Hypertension   . No pertinent past medical history     Family History: Family History  Problem Relation Age of Onset  . Breast cancer Mother   . Lung cancer Mother   . Cancer Maternal Uncle     Social History   Socioeconomic History  . Marital status: Single    Spouse name: Not on file  . Number of children: Not on file  . Years of education: Not on file  . Highest education level: Not on file  Occupational History  . Not on file  Tobacco Use  . Smoking status: Never Smoker  . Smokeless tobacco: Never Used  Substance and Sexual Activity  . Alcohol use: No    Alcohol/week: 0.0 standard drinks  . Drug use: No  . Sexual activity: Yes  Other Topics Concern  . Not on file  Social History Narrative  . Not on file   Social  Determinants of Health   Financial Resource Strain:   . Difficulty of Paying Living Expenses:   Food Insecurity:   . Worried About Charity fundraiser in the Last Year:   . Arboriculturist in the Last Year:   Transportation Needs:   . Film/video editor (Medical):   Marland Kitchen Lack of Transportation (Non-Medical):   Physical Activity:   . Days of Exercise per Week:   . Minutes of Exercise per Session:   Stress:   . Feeling of Stress :   Social Connections:   . Frequency of Communication with Friends and Family:   . Frequency of Social Gatherings with Friends and Family:   . Attends Religious Services:   . Active Member of Clubs or Organizations:   . Attends Archivist Meetings:   Marland Kitchen Marital Status:   Intimate Partner Violence:   . Fear of Current or Ex-Partner:   . Emotionally Abused:   Marland Kitchen Physically Abused:   . Sexually Abused:       Review of Systems  Constitutional: Negative.  Negative for chills, fatigue and unexpected weight change.  HENT: Negative.  Negative for congestion, rhinorrhea, sneezing and sore throat.   Eyes: Negative for redness.  Respiratory: Negative.  Negative for cough, chest tightness and shortness of  breath.   Cardiovascular: Negative.  Negative for chest pain and palpitations.  Gastrointestinal: Negative.  Negative for abdominal pain, constipation, diarrhea, nausea and vomiting.  Endocrine: Negative.   Genitourinary: Negative.  Negative for dysuria and frequency.  Musculoskeletal: Negative.  Negative for arthralgias, back pain, joint swelling and neck pain.  Skin: Negative.  Negative for rash.  Allergic/Immunologic: Negative.   Neurological: Negative.  Negative for tremors and numbness.  Hematological: Negative for adenopathy. Does not bruise/bleed easily.  Psychiatric/Behavioral: Negative.  Negative for behavioral problems, sleep disturbance and suicidal ideas. The patient is not nervous/anxious.     Vital Signs: BP (!) 155/95   Pulse 76    Temp 97.8 F (36.6 C)   Resp 16   Ht 6' (1.829 m)   Wt 295 lb 6.4 oz (134 kg)   SpO2 95%   BMI 40.06 kg/m    Physical Exam Vitals and nursing note reviewed.  Constitutional:      General: He is not in acute distress.    Appearance: He is well-developed. He is not diaphoretic.  HENT:     Head: Normocephalic and atraumatic.     Mouth/Throat:     Pharynx: No oropharyngeal exudate.  Eyes:     Pupils: Pupils are equal, round, and reactive to light.  Neck:     Thyroid: No thyromegaly.     Vascular: No JVD.     Trachea: No tracheal deviation.  Cardiovascular:     Rate and Rhythm: Normal rate and regular rhythm.     Heart sounds: Normal heart sounds. No murmur. No friction rub. No gallop.   Pulmonary:     Effort: Pulmonary effort is normal. No respiratory distress.     Breath sounds: Normal breath sounds. No wheezing or rales.  Chest:     Chest wall: No tenderness.  Abdominal:     Palpations: Abdomen is soft.     Tenderness: There is no abdominal tenderness. There is no guarding.  Musculoskeletal:        General: Normal range of motion.     Cervical back: Normal range of motion and neck supple.  Lymphadenopathy:     Cervical: No cervical adenopathy.  Skin:    General: Skin is warm and dry.  Neurological:     Mental Status: He is alert and oriented to person, place, and time.     Cranial Nerves: No cranial nerve deficit.  Psychiatric:        Behavior: Behavior normal.        Thought Content: Thought content normal.        Judgment: Judgment normal.    Assessment/Plan: 1. Hypertension, unspecified type Stable, reports his bp at home is good.  He takes lisinopril at night.     2. Other hyperlipidemia Discussed Cholesterol panel. He will continue to diet.  Discussed low dose statin therapy.   3. Pre-diabetes A1C 6.3, discussed ongoing lifestyle changes, and possible medication thearpy.  He will follow up in 3 months for follow up A1C.  4. Neck mass Scheduled for  surgical removal on 06/10/19  General Counseling: Lily Lovings understanding of the findings of todays visit and agrees with plan of treatment. I have discussed any further diagnostic evaluation that may be needed or ordered today. We also reviewed his medications today. he has been encouraged to call the office with any questions or concerns that should arise related to todays visit. Diabetes Counseling:  1. Addition of ACE inh/ ARB'S for nephroprotection. Microalbumin is updated  2. Diabetic foot care, prevention of complications. Podiatry consult 3. Exercise and lose weight.  4. Diabetic eye examination, Diabetic eye exam is updated  5. Monitor blood sugar closlely. nutrition counseling.  6. Sign and symptoms of hypoglycemia including shaking sweating,confusion and headaches.    No orders of the defined types were placed in this encounter.   No orders of the defined types were placed in this encounter.   Time spent: 30 Minutes   This patient was seen by Orson Gear AGNP-C in Collaboration with Dr Lavera Guise as a part of collaborative care agreement     Kendell Bane AGNP-C Internal medicine

## 2019-06-08 ENCOUNTER — Other Ambulatory Visit: Payer: Self-pay

## 2019-06-08 ENCOUNTER — Other Ambulatory Visit: Payer: Self-pay | Admitting: General Surgery

## 2019-06-08 ENCOUNTER — Encounter
Admission: RE | Admit: 2019-06-08 | Discharge: 2019-06-08 | Disposition: A | Discharge status: Home / Self Care | Payer: Commercial Managed Care - PPO | Source: Ambulatory Visit | Attending: General Surgery | Admitting: General Surgery

## 2019-06-08 DIAGNOSIS — Z20822 Contact with and (suspected) exposure to covid-19: Secondary | ICD-10-CM | POA: Insufficient documentation

## 2019-06-08 DIAGNOSIS — R9431 Abnormal electrocardiogram [ECG] [EKG]: Secondary | ICD-10-CM | POA: Diagnosis not present

## 2019-06-08 DIAGNOSIS — L918 Other hypertrophic disorders of the skin: Secondary | ICD-10-CM

## 2019-06-08 DIAGNOSIS — I10 Essential (primary) hypertension: Secondary | ICD-10-CM | POA: Insufficient documentation

## 2019-06-08 DIAGNOSIS — Z01818 Encounter for other preprocedural examination: Secondary | ICD-10-CM | POA: Diagnosis not present

## 2019-06-08 DIAGNOSIS — R221 Localized swelling, mass and lump, neck: Secondary | ICD-10-CM

## 2019-06-08 HISTORY — DX: Gastro-esophageal reflux disease without esophagitis: K21.9

## 2019-06-08 NOTE — Patient Instructions (Signed)
Your procedure is scheduled on:  Report to . To find out your arrival time please call [phone] between 1PM - 3PM on .  Remember: Instructions that are not followed completely may result in serious medical risk,  up to and including death, or upon the discretion of your surgeon and anesthesiologist your  surgery may need to be rescheduled.     _X__ 1. Do not eat food after midnight the night before your procedure.                 No gum chewing or hard candies. You may drink clear liquids up to 2 hours                 before you are scheduled to arrive for your surgery- DO not drink clear                 liquids within 2 hours of the start of your surgery.                 Clear Liquids include:  water, apple juice without pulp, clear Gatorade, G2 or                  Gatorade Zero (avoid Red/Purple/Blue), Black Coffee or Tea (Do not add                 anything to coffee or tea). _____2.   Complete the carbohydrate drink provided to you, 2 hours before arrival.  __X__2.  On the morning of surgery brush your teeth with toothpaste and water, you                may rinse your mouth with mouthwash if you wish.  Do not swallow any toothpaste of mouthwash.     _X__ 3.  No Alcohol for 24 hours before or after surgery.   ___ 4.  Do Not Smoke or use e-cigarettes For 24 Hours Prior to Your Surgery.                 Do not use any chewable tobacco products for at least 6 hours prior to                 surgery.  ____  5.  Bring all medications with you on the day of surgery if instructed.   __x__  6.  Notify your doctor if there is any change in your medical condition      (cold, fever, infections).     Do not wear jewelry,  Do not wear lotions, powders, or perfumes.  Do not shave 48 hours prior to surgery. Men may shave face and neck. Do not bring valuables to the hospital.    [facility] is not responsible for any belongings or valuables.  Contacts, dentures or  bridgework may not be worn into surgery. Leave your suitcase in the car. After surgery it may be brought to your room. For patients admitted to the hospital, discharge time is determined by your treatment team.   Patients discharged the day of surgery will not be allowed to drive home.   Make arrangements for someone to be with you for the first 24 hours of your Same Day Discharge.    Please read over the following fact sheets that you were given:    __x__ Take these medicines the morning of surgery with A SIP OF WATER:    1. none  2.   3.   4.  5.  6.  ____ Fleet Enema (as directed)   __x__ Use CHG Soap (or wipes) as directed  ____ Use Benzoyl Peroxide Gel as instructed  ____ Use inhalers on the day of surgery  ____ Stop metformin 2 days prior to surgery    ____ Take 1/2 of usual insulin dose the night before surgery. No insulin the morning          of surgery.   ____ Stop Coumadin/Plavix/aspirin on   _x___ Stop Anti-inflammatories no ibuprofen aleve or aspirin.  May take tylenol   ____ Stop supplements until after surgery.    ____ Bring C-Pap to the hospital.

## 2019-06-10 ENCOUNTER — Encounter
Admission: RE | Admit: 2019-06-10 | Discharge: 2019-06-10 | Disposition: A | Discharge status: Home / Self Care | Payer: Commercial Managed Care - PPO | Source: Ambulatory Visit | Attending: General Surgery | Admitting: General Surgery

## 2019-06-10 ENCOUNTER — Inpatient Hospital Stay: Admission: RE | Admit: 2019-06-10 | Payer: Commercial Managed Care - PPO | Source: Ambulatory Visit

## 2019-06-10 ENCOUNTER — Other Ambulatory Visit: Payer: Self-pay

## 2019-06-10 DIAGNOSIS — I10 Essential (primary) hypertension: Secondary | ICD-10-CM

## 2019-06-10 DIAGNOSIS — Z01818 Encounter for other preprocedural examination: Secondary | ICD-10-CM | POA: Diagnosis not present

## 2019-06-11 LAB — SARS CORONAVIRUS 2 (TAT 6-24 HRS): SARS Coronavirus 2: NEGATIVE

## 2019-06-12 ENCOUNTER — Ambulatory Visit
Admission: RE | Admit: 2019-06-12 | Discharge: 2019-06-12 | Disposition: A | Discharge status: Home / Self Care | Payer: Commercial Managed Care - PPO | Attending: General Surgery | Admitting: General Surgery

## 2019-06-12 ENCOUNTER — Ambulatory Visit: Payer: Commercial Managed Care - PPO

## 2019-06-12 ENCOUNTER — Other Ambulatory Visit: Payer: Self-pay

## 2019-06-12 ENCOUNTER — Encounter: Payer: Self-pay | Admitting: General Surgery

## 2019-06-12 ENCOUNTER — Encounter
Admission: RE | Disposition: A | Discharge status: Home / Self Care | Payer: Self-pay | Source: Home / Self Care | Attending: General Surgery

## 2019-06-12 DIAGNOSIS — L918 Other hypertrophic disorders of the skin: Secondary | ICD-10-CM

## 2019-06-12 DIAGNOSIS — I10 Essential (primary) hypertension: Secondary | ICD-10-CM | POA: Diagnosis not present

## 2019-06-12 DIAGNOSIS — Z79899 Other long term (current) drug therapy: Secondary | ICD-10-CM | POA: Insufficient documentation

## 2019-06-12 DIAGNOSIS — E042 Nontoxic multinodular goiter: Secondary | ICD-10-CM | POA: Diagnosis not present

## 2019-06-12 DIAGNOSIS — R221 Localized swelling, mass and lump, neck: Secondary | ICD-10-CM | POA: Diagnosis not present

## 2019-06-12 DIAGNOSIS — D170 Benign lipomatous neoplasm of skin and subcutaneous tissue of head, face and neck: Secondary | ICD-10-CM | POA: Diagnosis present

## 2019-06-12 DIAGNOSIS — K429 Umbilical hernia without obstruction or gangrene: Secondary | ICD-10-CM | POA: Diagnosis not present

## 2019-06-12 DIAGNOSIS — Z6841 Body Mass Index (BMI) 40.0 and over, adult: Secondary | ICD-10-CM | POA: Diagnosis not present

## 2019-06-12 HISTORY — PX: EXCISION OF SKIN TAG: SHX6270

## 2019-06-12 HISTORY — PX: EXCISION MASS NECK: SHX6703

## 2019-06-12 SURGERY — EXCISION, SKIN TAG
Anesthesia: General | Laterality: Right

## 2019-06-12 MED ORDER — FAMOTIDINE 20 MG PO TABS
ORAL_TABLET | ORAL | Status: AC
  Administered 2019-06-12: 20 mg via ORAL
  Filled 2019-06-12: qty 1

## 2019-06-12 MED ORDER — LIDOCAINE-EPINEPHRINE 1 %-1:100000 IJ SOLN
INTRAMUSCULAR | Status: AC
  Filled 2019-06-12: qty 1

## 2019-06-12 MED ORDER — OXYCODONE HCL 5 MG/5ML PO SOLN: 5.0000 mg | Freq: Once | ORAL | Status: DC | PRN

## 2019-06-12 MED ORDER — SUGAMMADEX SODIUM 500 MG/5ML IV SOLN
INTRAVENOUS | Status: DC | PRN
  Administered 2019-06-12: 268 mg via INTRAVENOUS

## 2019-06-12 MED ORDER — MIDAZOLAM HCL 2 MG/2ML IJ SOLN
INTRAMUSCULAR | Status: AC
  Filled 2019-06-12: qty 2

## 2019-06-12 MED ORDER — FAMOTIDINE 20 MG PO TABS: 20.0000 mg | ORAL_TABLET | Freq: Once | ORAL | Status: AC

## 2019-06-12 MED ORDER — MIDAZOLAM HCL 2 MG/2ML IJ SOLN
INTRAMUSCULAR | Status: DC | PRN
  Administered 2019-06-12: 2 mg via INTRAVENOUS

## 2019-06-12 MED ORDER — GABAPENTIN 300 MG PO CAPS
ORAL_CAPSULE | ORAL | Status: AC
  Administered 2019-06-12: 300 mg via ORAL
  Filled 2019-06-12: qty 1

## 2019-06-12 MED ORDER — HYDROCODONE-ACETAMINOPHEN 5-325 MG PO TABS: 1.0000 | ORAL_TABLET | Freq: Four times a day (QID) | ORAL | 0 refills | Status: DC | PRN

## 2019-06-12 MED ORDER — CELECOXIB 200 MG PO CAPS
ORAL_CAPSULE | ORAL | Status: AC
  Administered 2019-06-12: 200 mg via ORAL
  Filled 2019-06-12: qty 1

## 2019-06-12 MED ORDER — FENTANYL CITRATE (PF) 100 MCG/2ML IJ SOLN
INTRAMUSCULAR | Status: AC
  Filled 2019-06-12: qty 2

## 2019-06-12 MED ORDER — VASOPRESSIN 20 UNIT/ML IV SOLN
INTRAVENOUS | Status: AC
  Filled 2019-06-12: qty 1

## 2019-06-12 MED ORDER — BUPIVACAINE HCL (PF) 0.25 % IJ SOLN
INTRAMUSCULAR | Status: AC
  Filled 2019-06-12: qty 30

## 2019-06-12 MED ORDER — DEXAMETHASONE SODIUM PHOSPHATE 10 MG/ML IJ SOLN
INTRAMUSCULAR | Status: DC | PRN
  Administered 2019-06-12: 5 mg via INTRAVENOUS

## 2019-06-12 MED ORDER — GABAPENTIN 300 MG PO CAPS: 300.0000 mg | ORAL_CAPSULE | ORAL | Status: AC

## 2019-06-12 MED ORDER — ONDANSETRON HCL 4 MG/2ML IJ SOLN
INTRAMUSCULAR | Status: DC | PRN
  Administered 2019-06-12: 4 mg via INTRAVENOUS

## 2019-06-12 MED ORDER — FENTANYL CITRATE (PF) 100 MCG/2ML IJ SOLN: 25.0000 ug | INTRAMUSCULAR | Status: DC | PRN

## 2019-06-12 MED ORDER — PROPOFOL 10 MG/ML IV BOLUS
INTRAVENOUS | Status: AC
  Filled 2019-06-12: qty 40

## 2019-06-12 MED ORDER — GLYCOPYRROLATE 0.2 MG/ML IJ SOLN
INTRAMUSCULAR | Status: DC | PRN
  Administered 2019-06-12 (×2): .1 mg via INTRAVENOUS

## 2019-06-12 MED ORDER — LACTATED RINGERS IV SOLN: INTRAVENOUS | Status: DC

## 2019-06-12 MED ORDER — CEFAZOLIN SODIUM-DEXTROSE 2-4 GM/100ML-% IV SOLN
INTRAVENOUS | Status: AC
  Filled 2019-06-12: qty 100

## 2019-06-12 MED ORDER — SODIUM CHLORIDE 0.9 % IV SOLN
INTRAVENOUS | Status: DC | PRN
  Administered 2019-06-12: 30 ug/min via INTRAVENOUS

## 2019-06-12 MED ORDER — EPHEDRINE SULFATE 50 MG/ML IJ SOLN
INTRAMUSCULAR | Status: DC | PRN
  Administered 2019-06-12 (×2): 10 mg via INTRAVENOUS

## 2019-06-12 MED ORDER — CELECOXIB 200 MG PO CAPS: 200.0000 mg | ORAL_CAPSULE | ORAL | Status: AC

## 2019-06-12 MED ORDER — FENTANYL CITRATE (PF) 100 MCG/2ML IJ SOLN
INTRAMUSCULAR | Status: DC | PRN
  Administered 2019-06-12 (×2): 50 ug via INTRAVENOUS

## 2019-06-12 MED ORDER — LIDOCAINE HCL (CARDIAC) PF 100 MG/5ML IV SOSY
PREFILLED_SYRINGE | INTRAVENOUS | Status: DC | PRN
  Administered 2019-06-12: 80 mg via INTRAVENOUS

## 2019-06-12 MED ORDER — OXYCODONE HCL 5 MG PO TABS: 5.0000 mg | ORAL_TABLET | Freq: Once | ORAL | Status: DC | PRN

## 2019-06-12 MED ORDER — CHLORHEXIDINE GLUCONATE CLOTH 2 % EX PADS
6.0000 | MEDICATED_PAD | Freq: Once | CUTANEOUS | Status: AC
  Administered 2019-06-12: 6 via TOPICAL

## 2019-06-12 MED ORDER — VASOPRESSIN 20 UNIT/ML IV SOLN
INTRAVENOUS | Status: DC | PRN
  Administered 2019-06-12 (×3): 1 [IU] via INTRAVENOUS

## 2019-06-12 MED ORDER — PROPOFOL 10 MG/ML IV BOLUS
INTRAVENOUS | Status: DC | PRN
  Administered 2019-06-12: 200 mg via INTRAVENOUS

## 2019-06-12 MED ORDER — ROCURONIUM BROMIDE 100 MG/10ML IV SOLN
INTRAVENOUS | Status: DC | PRN
  Administered 2019-06-12: 20 mg via INTRAVENOUS
  Administered 2019-06-12 (×2): 10 mg via INTRAVENOUS
  Administered 2019-06-12: 20 mg via INTRAVENOUS

## 2019-06-12 MED ORDER — DEXMEDETOMIDINE HCL IN NACL 400 MCG/100ML IV SOLN
INTRAVENOUS | Status: DC | PRN
  Administered 2019-06-12: 8 ug via INTRAVENOUS

## 2019-06-12 MED ORDER — IBUPROFEN 800 MG PO TABS: 800.0000 mg | ORAL_TABLET | Freq: Three times a day (TID) | ORAL | 0 refills | Status: DC | PRN

## 2019-06-12 MED ORDER — LIDOCAINE-EPINEPHRINE 1 %-1:100000 IJ SOLN
INTRAMUSCULAR | Status: DC | PRN
  Administered 2019-06-12: 3 mL
  Administered 2019-06-12: 2 mL

## 2019-06-12 MED ORDER — ACETAMINOPHEN 500 MG PO TABS
ORAL_TABLET | ORAL | Status: AC
  Administered 2019-06-12: 1000 mg via ORAL
  Filled 2019-06-12: qty 2

## 2019-06-12 MED ORDER — ACETAMINOPHEN 500 MG PO TABS: 1000.0000 mg | ORAL_TABLET | ORAL | Status: AC

## 2019-06-12 MED ORDER — PHENYLEPHRINE HCL (PRESSORS) 10 MG/ML IV SOLN
INTRAVENOUS | Status: DC | PRN
  Administered 2019-06-12: 200 ug via INTRAVENOUS
  Administered 2019-06-12: 300 ug via INTRAVENOUS
  Administered 2019-06-12 (×3): 200 ug via INTRAVENOUS

## 2019-06-12 MED ORDER — CEFAZOLIN SODIUM-DEXTROSE 2-4 GM/100ML-% IV SOLN
2.0000 g | INTRAVENOUS | Status: AC
  Administered 2019-06-12: 2 g via INTRAVENOUS

## 2019-06-12 MED ORDER — SUCCINYLCHOLINE CHLORIDE 20 MG/ML IJ SOLN
INTRAMUSCULAR | Status: DC | PRN
  Administered 2019-06-12: 140 mg via INTRAVENOUS

## 2019-06-12 SURGICAL SUPPLY — 36 items
BACTOSHIELD CHG 4% 4OZ (MISCELLANEOUS) ×2
BASIN GRAD PLASTIC 32OZ STRL (MISCELLANEOUS) ×3 IMPLANT
BLADE SURG 15 STRL LF DISP TIS (BLADE) ×2 IMPLANT
BLADE SURG 15 STRL SS (BLADE) ×1
CANISTER SUCT 1200ML W/VALVE (MISCELLANEOUS) ×3 IMPLANT
CHLORAPREP W/TINT 26 (MISCELLANEOUS) IMPLANT
COVER WAND RF STERILE (DRAPES) IMPLANT
DECANTER SPIKE VIAL GLASS SM (MISCELLANEOUS) ×6 IMPLANT
DERMABOND ADVANCED (GAUZE/BANDAGES/DRESSINGS) ×2
DERMABOND ADVANCED .7 DNX12 (GAUZE/BANDAGES/DRESSINGS) ×4 IMPLANT
DRAPE MAG INST 16X20 L/F (DRAPES) IMPLANT
DRAPE THYROID T SHEET (DRAPES) ×3 IMPLANT
ELECT CAUTERY BLADE TIP 2.5 (TIP) ×3
ELECT REM PT RETURN 9FT ADLT (ELECTROSURGICAL) ×3
ELECTRODE CAUTERY BLDE TIP 2.5 (TIP) ×2 IMPLANT
ELECTRODE REM PT RTRN 9FT ADLT (ELECTROSURGICAL) ×2 IMPLANT
GAUZE 4X4 16PLY RFD (DISPOSABLE) IMPLANT
GLOVE BIO SURGEON STRL SZ 6.5 (GLOVE) ×9 IMPLANT
GLOVE INDICATOR 7.0 STRL GRN (GLOVE) ×9 IMPLANT
GOWN STRL REUS W/ TWL LRG LVL3 (GOWN DISPOSABLE) ×6 IMPLANT
GOWN STRL REUS W/TWL LRG LVL3 (GOWN DISPOSABLE) ×3
HEMOSTAT SNOW SURGICEL 2X4 (HEMOSTASIS) ×3 IMPLANT
KIT TURNOVER KIT A (KITS) ×3 IMPLANT
NEEDLE HYPO 25X1 1.5 SAFETY (NEEDLE) ×3 IMPLANT
NS IRRIG 500ML POUR BTL (IV SOLUTION) ×3 IMPLANT
PACK BASIN MINOR (MISCELLANEOUS) ×3 IMPLANT
SCRUB CHG 4% DYNA-HEX 4OZ (MISCELLANEOUS) ×4 IMPLANT
SPONGE KITTNER 5P (MISCELLANEOUS) ×3 IMPLANT
STRIP CLOSURE SKIN 1/2X4 (GAUZE/BANDAGES/DRESSINGS) ×3 IMPLANT
SUT MNCRL 4-0 (SUTURE) ×1
SUT MNCRL 4-0 27XMFL (SUTURE) ×2
SUT VIC AB 3-0 SH 27 (SUTURE) ×1
SUT VIC AB 3-0 SH 27X BRD (SUTURE) ×2 IMPLANT
SUTURE MNCRL 4-0 27XMF (SUTURE) ×2 IMPLANT
SYR 10ML LL (SYRINGE) ×3 IMPLANT
SYR BULB IRRIG 60ML STRL (SYRINGE) ×3 IMPLANT

## 2019-06-12 NOTE — OR Nursing (Signed)
Discharge pending transportation 

## 2019-06-12 NOTE — H&P (Signed)
HPI Alexander Simon is a 49 y.o. male.   He was seen in 2016 by Dr. Jamal Collin.  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  No glandular cells were appreciated.  He had been scheduled for surgical resection, however a series of life advised has a delayed this.  He is here today to discuss surgical removal of the mass.  He states that since he was last seen, he feels like it has increased in size.  He denies any pain associated with the mass.  No fevers or chills.  No lateral compressive symptoms, but he does endorse some difficulty swallowing, primarily first thing in the morning when he wakes up.  The mass has never been red, hot, tender, nor drained any fluid or other material.  The increase in size and the cosmetic aspect of it are most troublesome to him.  He also has multiple skin tags on his neck that rub against his clothing that he would like addressed.       Past Medical History:  Diagnosis Date  . Hypertension   . No pertinent past medical history          Past Surgical History:  Procedure Laterality Date  . CIRCUMCISION    . SHOULDER SURGERY  2014         Family History  Problem Relation Age of Onset  . Breast cancer Mother   . Lung cancer Mother   . Cancer Maternal Uncle     Social History Social History        Tobacco Use  . Smoking status: Never Smoker  . Smokeless tobacco: Never Used  Substance Use Topics  . Alcohol use: No    Alcohol/week: 0.0 standard drinks  . Drug use: No    No Known Allergies        Current Outpatient Medications  Medication Sig Dispense Refill  . b complex vitamins tablet Take 1 tablet by mouth daily.    Marland Kitchen lisinopril (ZESTRIL) 30 MG tablet TAKE 1 TABLET BY MOUTH EVERY DAY 90 tablet 0  . sildenafil (VIAGRA) 100 MG tablet Take 1 tablet (100 mg total) by mouth daily as needed for erectile dysfunction. 90 tablet 0   No current facility-administered  medications for this visit.    Review of Systems Review of Systems  All other systems reviewed and are negative.   Today's Vitals   06/12/19 0624  BP: (!) 160/96  Pulse: 82  Resp: 18  Temp: 98.7 F (37.1 C)  TempSrc: Temporal  SpO2: 99%  PainSc: 0-No pain   There is no height or weight on file to calculate BMI.   Physical Exam Vitals reviewed.  Constitutional:      General: He is not in acute distress.    Appearance: Normal appearance. He is obese.  HENT:     Head: Normocephalic and atraumatic.     Nose:     Comments: WNL    Mouth/Throat:     Comments:  WNL Eyes:     General: No scleral icterus.       Right eye: No discharge.        Left eye: No discharge.     Conjunctiva/sclera: Conjunctivae normal.  Neck:     Thyroid: Thyromegaly present.     Trachea: Trachea normal. No tracheal deviation.      Comments: There is an approximately 3 to 4 cm well-circumscribed soft mobile mass just anterior to the  right earlobe.  It feels like it is within the subcutaneous tissues.  It is not tender to manipulation.  Thyroid is significantly enlarged.  I do not appreciate any discretely palpable nodules.  The gland does move freely with deglutition and elevate above the level of the clavicles.  Multiple small skin tags present Cardiovascular:     Rate and Rhythm: Normal rate and regular rhythm.     Pulses: Normal pulses.  Pulmonary:     Effort: Pulmonary effort is normal.     Breath sounds: Normal breath sounds.  Abdominal:     General: Bowel sounds are normal.     Palpations: Abdomen is soft.     Hernia: A hernia is present.     Comments: A reducible umbilical is present.  Genitourinary:    Comments: Deferred Musculoskeletal:        General: No swelling or tenderness.  Lymphadenopathy:     Cervical: No cervical adenopathy.  Skin:    General: Skin is warm and dry.  Neurological:     General: No focal deficit present.     Mental Status: He is alert and oriented  to person, place, and time.  Psychiatric:        Mood and Affect: Mood normal.        Behavior: Behavior normal.        Thought Content: Thought content normal.     Data Reviewed I reviewed the imaging on two in-office focused ultrasounds performed by Dr. Jamal Collin.  These show a 2 x 3 cm well-circumscribed solid structure that does not have the imaging characteristics consistent with salivary gland tissue; it appears more fibrous/adipose, which would be consistent with the core biopsy findings, and discussed above in the history of present illness.  Assessment This is a 49 year-old man with a mass on the right side of his neck.  It has been previously biopsied and found to be benign, however it is growing and becoming more bothersome to him.  He would like to have it removed.  He also has multiple skin tags.  In addition, he had fairly marked thyromegaly.  He has a multinodular goiter which may account for some of his described dysphagia.  He denies any symptoms of hyper or hypothyroidism and none of the nodules meet criteria for biopsy.   He has a reducible umbilical hernia that is not causing concern at this time.  Plan We will proceed with surgical resection of the mass on his neck.  I will also remove the bothersome skin tags at that time.  I discussed the risks of the procedure with him.  These include, but are not limited to, bleeding, infection, damage to surrounding tissues or structures, recurrence.  He has accepted these risks and wishes to proceed.  I will proceed with further evaluation and management of his thyroid nodules after we surgically address the neck mass.  He would like to have his hernia repaired after he recovers from the neck mass excision.

## 2019-06-12 NOTE — Transfer of Care (Signed)
Immediate Anesthesia Transfer of Care Note  Patient: Alexander Simon  Procedure(s) Performed: EXCISION OF SKIN TAG on neck (Bilateral ) EXCISION Superficial MASS NECK (Right )  Patient Location: PACU  Anesthesia Type:General  Level of Consciousness: sedated  Airway & Oxygen Therapy: Patient Spontanous Breathing and Patient connected to face mask oxygen  Post-op Assessment: Report given to RN and Post -op Vital signs reviewed and stable  Post vital signs: Reviewed and stable  Last Vitals:  Vitals Value Taken Time  BP 143/82 06/12/19 0850  Temp 36.2 C 06/12/19 0850  Pulse 99 06/12/19 0852  Resp 23 06/12/19 0852  SpO2 98 % 06/12/19 0852  Vitals shown include unvalidated device data.  Last Pain:  Vitals:   06/12/19 0624  TempSrc: Temporal  PainSc: 0-No pain         Complications: No apparent anesthesia complications

## 2019-06-12 NOTE — Op Note (Signed)
Operative Note  Preoperative Diagnosis:  1.  Mass on right face/neck (angle of the mandible); 2.  Multiple skin tags  Postoperative Diagnosis: Same  Operation:  1.  Excision of 3.5 cm mass from the right angle of the mandible; 2.  Excision of multiple skin tags (3)  Surgeon: Fredirick Maudlin, MD  Assistant: None  Anesthesia: General endotracheal  Findings: The mass was rubbery, firm, encapsulated, and well-circumscribed.  It abutted, but did not involve the parotid gland.  The skin tags appeared to be simple benign lesions.  Indications: This is a 49 year old man who has had a mass on his right mandible for a number of years.  It was previously biopsied and felt to be benign.  It has grown over time and it is bothering him.  He was interested in surgical removal.  In addition, he had a number of skin tags on his neck that rub against his shirt collars and he wished to have those removed as well.  The risks of both procedures were discussed in detail with the patient and he agreed to proceed.  Procedure In Detail: The patient was identified in the preoperative holding area where the lesions to be removed were marked.  He was then brought to the operating room and placed supine on the OR table.  All bony prominences were padded and bilateral sequential compression devices were placed on the lower extremities.  General endotracheal anesthesia was induced without incident.  He was then positioned appropriately for the operation and sterilely prepped and draped in standard fashion.  A timeout was performed confirming the patient's identity, the procedure being performed, his allergies, all necessary equipment was available, and that maintenance anesthesia was adequate.  A one-to-one mixture of 0.25% bupivacaine and 1% lidocaine with epinephrine was infiltrated at the base of each of the skin tags.  These were each sharply excised with a 15 blade and the base cauterized.  They were handed off en masse  as a specimen.  The skin overlying the lesion was infiltrated with the same mixture of local anesthetic.  The skin was sharply incised.  Dissection continued using electrocautery until the mass was identified.  It was rubbery, firm, and appeared to be well encapsulated.  It was carefully dissected away from the overlying skin.  It was circumferentially dissected out.  At the most superior aspect, it abutted, but did not involve the parotid gland.  It was dissected off of the underlying muscle and completely removed.  It was handed off as a specimen.  There was a small amount of oozing from the parotid parenchyma.  This was treated with electrocautery and a small piece of topical hemostatic agent (SNoW).  The wound was then closed in 2 layers, with a deep dermal layer of 3-0 Vicryl and a running subcuticular 4-0 Monocryl.  The skin was cleaned and Dermabond and Steri-Strips were applied to the incision.  A small dab of Dermabond was also applied to the skin tag excision sites.  Patient was awakened, extubated, and taken to the postanesthesia care unit in good condition.  EBL: Less than 5 cc  IVF: See anesthesia record  Specimen(s): Right neck/facial mass; skin tags x3  Complications: none immediately apparent.   Counts: all needles, instruments, and sponges were counted and reported to be correct in number at the end of the case.   I was present for and participated in the entire operation.  Fredirick Maudlin 8:53 AM

## 2019-06-12 NOTE — Anesthesia Procedure Notes (Signed)
Procedure Name: Intubation Performed by: Cory Munch, RN Pre-anesthesia Checklist: Patient identified, Emergency Drugs available, Suction available and Patient being monitored Patient Re-evaluated:Patient Re-evaluated prior to induction Oxygen Delivery Method: Circle system utilized Preoxygenation: Pre-oxygenation with 100% oxygen Induction Type: IV induction Ventilation: Two handed mask ventilation required and Oral airway inserted - appropriate to patient size Laryngoscope Size: McGraph and 4 Grade View: Grade II Tube type: Oral Tube size: 7.5 mm Number of attempts: 1 Airway Equipment and Method: Stylet and Oral airway Placement Confirmation: ETT inserted through vocal cords under direct vision,  positive ETCO2 and breath sounds checked- equal and bilateral Secured at: 22 cm Tube secured with: Tape Dental Injury: Teeth and Oropharynx as per pre-operative assessment

## 2019-06-12 NOTE — Anesthesia Preprocedure Evaluation (Signed)
Anesthesia Evaluation  Patient identified by MRN, date of birth, ID band Patient awake    Reviewed: Allergy & Precautions, H&P , NPO status , Patient's Chart, lab work & pertinent test results  Airway Mallampati: III  TM Distance: >3 FB Neck ROM: full    Dental  (+) Teeth Intact   Pulmonary neg pulmonary ROS, neg COPD, Not current smoker,    breath sounds clear to auscultation       Cardiovascular hypertension, (-) angina(-) Past MI (-) dysrhythmias  Rhythm:regular Rate:Normal     Neuro/Psych negative neurological ROS  negative psych ROS   GI/Hepatic Neg liver ROS, GERD  Medicated and Controlled,  Endo/Other  Morbid obesity  Renal/GU      Musculoskeletal   Abdominal   Peds  Hematology negative hematology ROS (+)   Anesthesia Other Findings Past Medical History: No date: GERD (gastroesophageal reflux disease)     Comment:  no meds No date: Hypertension No date: No pertinent past medical history  Past Surgical History: No date: CIRCUMCISION 2014: SHOULDER SURGERY; Right     Reproductive/Obstetrics negative OB ROS                             Anesthesia Physical Anesthesia Plan  ASA: II  Anesthesia Plan: General ETT   Post-op Pain Management:    Induction:   PONV Risk Score and Plan: Ondansetron, Dexamethasone, Midazolam and Treatment may vary due to age or medical condition  Airway Management Planned:   Additional Equipment:   Intra-op Plan:   Post-operative Plan:   Informed Consent: I have reviewed the patients History and Physical, chart, labs and discussed the procedure including the risks, benefits and alternatives for the proposed anesthesia with the patient or authorized representative who has indicated his/her understanding and acceptance.     Dental Advisory Given  Plan Discussed with: Anesthesiologist, CRNA and Surgeon  Anesthesia Plan Comments:          Anesthesia Quick Evaluation

## 2019-06-12 NOTE — Discharge Instructions (Signed)

## 2019-06-13 NOTE — Anesthesia Postprocedure Evaluation (Signed)
Anesthesia Post Note  Patient: Alexander Simon  Procedure(s) Performed: EXCISION OF SKIN TAG on neck (Bilateral ) EXCISION Superficial MASS NECK (Right )  Patient location during evaluation: PACU Anesthesia Type: General Level of consciousness: awake and alert Pain management: pain level controlled Vital Signs Assessment: post-procedure vital signs reviewed and stable Respiratory status: spontaneous breathing, nonlabored ventilation and respiratory function stable Cardiovascular status: blood pressure returned to baseline and stable Postop Assessment: no apparent nausea or vomiting Anesthetic complications: no     Last Vitals:  Vitals:   06/12/19 1018 06/12/19 1042  BP: 111/66 127/67  Pulse: 96 85  Resp: 16 18  Temp:    SpO2: 98% 95%    Last Pain:  Vitals:   06/12/19 1042  TempSrc:   PainSc: 0-No pain                 Tera Mater

## 2019-06-15 ENCOUNTER — Encounter: Payer: Self-pay | Admitting: General Surgery

## 2019-06-15 LAB — SURGICAL PATHOLOGY

## 2019-06-24 ENCOUNTER — Other Ambulatory Visit: Payer: Self-pay | Admitting: Internal Medicine

## 2019-06-24 DIAGNOSIS — I10 Essential (primary) hypertension: Secondary | ICD-10-CM

## 2019-06-25 ENCOUNTER — Other Ambulatory Visit: Payer: Self-pay

## 2019-06-25 ENCOUNTER — Encounter: Payer: Self-pay | Admitting: General Surgery

## 2019-06-25 ENCOUNTER — Ambulatory Visit (INDEPENDENT_AMBULATORY_CARE_PROVIDER_SITE_OTHER): Payer: Self-pay | Admitting: General Surgery

## 2019-06-25 VITALS — BP 170/96 | HR 99 | Temp 98.4°F | Ht 72.0 in | Wt 298.0 lb

## 2019-06-25 DIAGNOSIS — R221 Localized swelling, mass and lump, neck: Secondary | ICD-10-CM

## 2019-06-25 NOTE — Patient Instructions (Addendum)
May rub Vitamin-E oil or other emmolient agent in area 2-3 times a day to soften. You will need to use sunscreen for the next year on the area to minimize altered pigmentation of the site.   Follow up here in 6 months to talk about the umbilical hernia.   Please call and ask to speak with a nurse if you develop questions or concerns.    Goiter  A goiter is an enlarged thyroid gland. The thyroid is located in the lower front of the neck. It makes hormones that affect many body parts and systems, including the system that affects how quickly the body burns fuel for energy (metabolism). Most goiters are painless and are not a cause for concern. Some goiters can affect the way your thyroid makes thyroid hormones. Goiters and conditions that cause goiters can be treated, if necessary. What are the causes? Common causes of this condition include:  Lack (deficiency) of a mineral called iodine. The thyroid gland uses iodine to make thyroid hormones.  Diseases that attack healthy cells in the body (autoimmune diseases) and affect thyroid function, such as Graves' disease or Hashimoto's disease. These diseases may cause the body to produce too much thyroid hormone (hyperthyroidism) or too little of the hormone (hypothyroidism).  Conditions that cause inflammation of the thyroid (thyroiditis).  One or more small growths on the thyroid (nodular goiter). Other causes include:  Medical problems caused by abnormal genes that are passed from parent to child (genetic defects).  Thyroid injury or infection.  Tumors that may or may not be cancerous.  Pregnancy.  Certain medicines.  Exposure to radiation. In some cases, the cause may not be known. What increases the risk? This condition is more likely to develop in:  People who do not get enough iodine in their diet.  People who have a family history of goiter.  Women.  People who are older than age 50.  People who smoke  tobacco.  People who have had exposure to radiation. What are the signs or symptoms? The main symptom of this condition is swelling in the lower, front part of the neck. This swelling can range from a very small bump to a large lump. Other symptoms may include:  A tight feeling in the throat.  A hoarse voice.  Coughing.  Wheezing.  Difficulty swallowing or breathing.  Bulging veins in the neck.  Dizziness. When a goiter is the result of an overactive thyroid (hyperthyroidism), symptoms may also include:  Nervousness or restlessness.  Inability to tolerate heat.  Unexplained weight loss.  Diarrhea.  Change in the texture of hair or skin.  Changes in heartbeat, such as skipped beats, extra beats, or a rapid heart rate.  Loss of menstruation.  Shaky hands.  Increased appetite.  Sleep problems. When a goiter is the result of an underactive thyroid (hypothyroidism), symptoms may also include:  Feeling like you have no energy (lethargy).  Inability to tolerate cold.  Weight gain that is not explained by a change in diet or exercise habits.  Dry skin.  Coarse hair.  Irregular menstrual periods.  Constipation.  Sadness or depression.  Fatigue. In some cases, there may not be any symptoms and the thyroid hormone levels may be normal. How is this diagnosed? This condition may be diagnosed based on your symptoms, your medical history, and a physical exam. You may have tests, such as:  Blood tests to check thyroid function.  Imaging tests, such as: ? Ultrasound. ? CT scan. ? MRI. ?  Thyroid scan.  Removal of a tissue sample (biopsy) of the goiter or any nodules. The sample will be tested to check for cancer. How is this treated? Treatment for this condition depends on the cause and your symptoms. Treatment may include:  Medicines to regulate thyroid hormone levels.  Anti-inflammatory medicines or steroid medicines, if the goiter is caused by  inflammation.  Iodine supplements or changes to your diet, if the goiter is caused by iodine deficiency.  Radioactive iodine treatment.  Surgery to remove your thyroid. In some cases, you may only need regular check-ups with your health care provider to monitor your condition, and you may not need treatment. Follow these instructions at home:  Follow instructions from your health care provider about any changes to your diet.  Take over-the-counter and prescription medicines only as told by your health care provider. These include supplements.  Do not use any products that contain nicotine or tobacco, such as cigarettes and e-cigarettes. If you need help quitting, ask your health care provider.  Keep all follow-up visits as told by your health care provider. This is important. Contact a health care provider if:  Your symptoms do not get better with treatment.  You have nausea, vomiting, or diarrhea. Get help right away if:  You have sudden, unexplained confusion or other mental changes.  You have a fever.  You have chest pain.  You have trouble breathing or swallowing.  You suddenly become very weak.  You experience extreme restlessness.  You feel your heart racing. Summary  A goiter is an enlarged thyroid gland.  The thyroid gland is located in the lower front of the neck. It makes hormones that affect many body parts and systems, including the system that affects how quickly the body burns fuel for energy (metabolism).  The main symptom of this condition is swelling in the lower, front part of the neck. This swelling can range from a very small bump to a large lump.  Treatment for this condition depends on the cause and your symptoms. You may need medicines, supplements, or regular monitoring of your condition. This information is not intended to replace advice given to you by your health care provider. Make sure you discuss any questions you have with your health care  provider. Document Revised: 01/25/2017 Document Reviewed: 11/08/2016 Elsevier Patient Education  2020 [employer].

## 2019-06-25 NOTE — Progress Notes (Signed)
Alexander Simon is here today for a postoperative visit.  He is a 49 year old man who underwent excision of a right mandibular mass, as well as removal of several skin tags.  Final pathology of the mandibular mass:  NECK MASS, RIGHT; EXCISION:  - SPINDLE CELL LIPOMA.  - NEGATIVE FOR MALIGNANCY.   Since his operation, he says he has been doing well.  He notes some tenderness and swelling at the surgical site.  No concern regarding the skin tag removal sites.  He denies any fevers or chills.  No nausea or vomiting.  He has not noted any drainage from the site.  He has been using just ibuprofen for pain control.  Today's Vitals   06/25/19 1120  BP: (!) 170/96  Pulse: 99  Temp: 98.4 F (36.9 C)  SpO2: 97%  Weight: 298 lb (135.2 kg)  Height: 6' (1.829 m)   Body mass index is 40.42 kg/m. Focused examination demonstrates a nicely approximated incision along the right mandible.  There is some expected postoperative swelling present.  No healing ridge is identified.  There is no erythema, induration, or drainage.  The skin tag excision sites have all healed completely.  Impression and plan: This is a 49 year old man who had a right mandibular mass removed.  It was a benign finding.  He has done well since his operation.  He was instructed in scar care today, including massage with a topical emollient agent to minimize tethering and improve cosmesis.  He was also advised to use some sort of ultraviolet protection (sunscreen) for the next year to avoid altered pigmentation of the skin.  Alexander Simon also has a large multinodular goiter as well as an umbilical hernia.  He is asymptomatic from his goiter but we will continue to monitor this over time.  He is interested in having his umbilical hernia repaired, but states that he would like to lose some weight prior to proceeding.  We will see him back in 6 months time to reevaluate for umbilical hernia repair.

## 2019-09-03 ENCOUNTER — Telehealth: Payer: Self-pay

## 2019-09-03 NOTE — Telephone Encounter (Signed)
Called lmom informing patient of appointment on 09/07/2019. klh 

## 2019-09-07 ENCOUNTER — Ambulatory Visit: Payer: Commercial Managed Care - PPO | Admitting: Adult Health

## 2019-09-08 ENCOUNTER — Telehealth: Payer: Self-pay

## 2019-09-08 NOTE — Telephone Encounter (Signed)
LEFT MESSAGE AND ASKED PT TO CALL BACK AND [place] MISSED APPOINTMENT. BETH

## 2019-12-15 ENCOUNTER — Encounter: Payer: Commercial Managed Care - PPO | Admitting: General Surgery

## 2019-12-29 ENCOUNTER — Other Ambulatory Visit: Payer: Self-pay

## 2019-12-29 DIAGNOSIS — I10 Essential (primary) hypertension: Secondary | ICD-10-CM

## 2019-12-29 MED ORDER — LISINOPRIL 30 MG PO TABS: 30.0000 mg | ORAL_TABLET | Freq: Every day | ORAL | 0 refills | Status: DC

## 2020-01-11 ENCOUNTER — Other Ambulatory Visit: Payer: Self-pay

## 2020-01-11 DIAGNOSIS — I10 Essential (primary) hypertension: Secondary | ICD-10-CM

## 2020-01-11 MED ORDER — LISINOPRIL 30 MG PO TABS: 30.0000 mg | ORAL_TABLET | Freq: Every day | ORAL | 0 refills | Status: DC

## 2020-01-28 ENCOUNTER — Telehealth: Payer: Self-pay

## 2020-01-28 ENCOUNTER — Encounter: Payer: Self-pay | Admitting: General Surgery

## 2020-01-28 NOTE — Telephone Encounter (Signed)
Patient sent my chart message stating he has tingling and numbness at the site. Excision of skin tag and neck mass 05/2019.

## 2020-02-03 ENCOUNTER — Other Ambulatory Visit: Payer: Self-pay | Admitting: Internal Medicine

## 2020-02-03 DIAGNOSIS — I10 Essential (primary) hypertension: Secondary | ICD-10-CM

## 2020-02-03 NOTE — Telephone Encounter (Signed)
Pt needs to have follow up in 3 months

## 2020-02-09 ENCOUNTER — Telehealth: Payer: Self-pay

## 2020-02-09 NOTE — Telephone Encounter (Signed)
Pt doesn't want to schedule any appt until he needs meds. Pt will be inactive after 1 year from the time of last visit.

## 2020-03-01 ENCOUNTER — Ambulatory Visit: Payer: Commercial Managed Care - PPO | Admitting: Hospice and Palliative Medicine

## 2020-03-08 ENCOUNTER — Telehealth: Payer: Self-pay

## 2020-03-08 NOTE — Telephone Encounter (Signed)
Patient was mailed a discharge letter on 03-08-20. No longer a patient of [facility] due to non compliancy. Copy placed in scan.

## 2020-05-01 ENCOUNTER — Other Ambulatory Visit: Payer: Self-pay | Admitting: Internal Medicine

## 2020-05-01 DIAGNOSIS — I10 Essential (primary) hypertension: Secondary | ICD-10-CM

## 2020-06-29 ENCOUNTER — Other Ambulatory Visit: Payer: Self-pay

## 2020-06-29 ENCOUNTER — Emergency Department
Admission: EM | Admit: 2020-06-29 | Discharge: 2020-06-29 | Disposition: A | Discharge status: Home / Self Care | Payer: Commercial Managed Care - PPO | Attending: Emergency Medicine | Admitting: Emergency Medicine

## 2020-06-29 ENCOUNTER — Emergency Department: Payer: Commercial Managed Care - PPO

## 2020-06-29 DIAGNOSIS — R0789 Other chest pain: Secondary | ICD-10-CM | POA: Diagnosis present

## 2020-06-29 DIAGNOSIS — R079 Chest pain, unspecified: Secondary | ICD-10-CM

## 2020-06-29 DIAGNOSIS — Z79899 Other long term (current) drug therapy: Secondary | ICD-10-CM | POA: Diagnosis not present

## 2020-06-29 DIAGNOSIS — I10 Essential (primary) hypertension: Secondary | ICD-10-CM

## 2020-06-29 LAB — BASIC METABOLIC PANEL
Anion gap: 6 (ref 5–15)
BUN: 18 mg/dL (ref 6–20)
CO2: 24 mmol/L (ref 22–32)
Calcium: 8.7 mg/dL — ABNORMAL LOW (ref 8.9–10.3)
Chloride: 105 mmol/L (ref 98–111)
Creatinine, Ser: 0.99 mg/dL (ref 0.61–1.24)
GFR, Estimated: >60 mL/min (ref >60)
Glucose, Bld: 93 mg/dL (ref 70–99)
Potassium: 4.3 mmol/L (ref 3.5–5.1)
Sodium: 135 mmol/L (ref 135–145)

## 2020-06-29 LAB — CBC
HCT: 40.4 % (ref 39.0–52.0)
Hemoglobin: 12.9 g/dL — ABNORMAL LOW (ref 13.0–17.0)
MCH: 25.7 pg — ABNORMAL LOW (ref 26.0–34.0)
MCHC: 31.9 g/dL (ref 30.0–36.0)
MCV: 80.5 fL (ref 80.0–100.0)
Platelets: 247 10*3/uL (ref 150–400)
RBC: 5.02 MIL/uL (ref 4.22–5.81)
RDW: 13.7 % (ref 11.5–15.5)
WBC: 6.5 10*3/uL (ref 4.0–10.5)
nRBC: 0 % (ref 0.0–0.2)

## 2020-06-29 LAB — TROPONIN I (HIGH SENSITIVITY)
Troponin I (High Sensitivity): 16 ng/L (ref <18)
Troponin I (High Sensitivity): 14 ng/L (ref <18)

## 2020-06-29 MED ORDER — ACETAMINOPHEN 500 MG PO TABS: 1000.0000 mg | ORAL_TABLET | Freq: Once | ORAL | Status: DC

## 2020-06-29 MED ORDER — IBUPROFEN 600 MG PO TABS
600.0000 mg | ORAL_TABLET | Freq: Once | ORAL | Status: AC
  Administered 2020-06-29: 600 mg via ORAL
  Filled 2020-06-29: qty 1

## 2020-06-29 NOTE — ED Notes (Signed)
Pt states has dull heavy CP since yesterday. No radiation to jaw or arm. Pt appears to be in NAD. Wife at bedside. Labs and EKG already performed.

## 2020-06-29 NOTE — ED Triage Notes (Signed)
Pt comes into the ED via EMS from work with c/o chest pain, pt took ASA 324mg , 1 nitro spray. Pt is in NAD at present. #20g RAC

## 2020-06-29 NOTE — ED Notes (Signed)
Second nurse at bedside to draw troponin and give ordered Ibuprophen.

## 2020-06-29 NOTE — ED Provider Notes (Signed)
[facility] Emergency Department Provider Note  ____________________________________________   Event Date/Time   First MD Initiated Contact with Patient 06/29/20 1031     (approximate)  I have reviewed the triage vital signs and the nursing notes.   HISTORY  Chief Complaint Chest Pain    HPI Alexander Simon is a 50 y.o. male with GERD, hypertension who comes in with chest pain.  Patient came in with chest pain.  Patient took aspirin 324 and 1 nitro spray.  Patient denies any history of cardiac issues in the past.  Patient states that last night he was under a lot of stress and he felt like he had a panic attack.  He reports his panic attacks is feeling short of breath and having chest pain that is on the left upper chest wall.  He states that he is able to do some deep breathing and to get the chest pain to stop and resolve his anxiety.  He was able to go to bed but he states that he woke up and was at work when he started feeling some twinging on the left chest wall again.  Patient reports that is a dull heavy ache.  No other radiation.  Nothing makes it better or worse.  Denies any shortness of breath with that.  Denies any risk factors for PEs.  On review of records patient was admitted to the hospital team for T wave inversions and seen by Dr. Rockey Situ from cardiology who recommended controlling his blood pressure which improved his symptoms.  He was supposed to follow-up outpatient for stress test. Pt reports that it was normal. This happened few years ago.       Past Medical History:  Diagnosis Date  . GERD (gastroesophageal reflux disease)    no meds  . Hypertension   . No pertinent past medical history     Patient Active Problem List   Diagnosis Date Noted  . Mass of right side of neck   . Skin tags, multiple acquired   . Hypertension 02/20/2018  . Chest pain 03/09/2017    Past Surgical History:  Procedure Laterality Date  . CIRCUMCISION     . EXCISION MASS NECK Right 06/12/2019   Procedure: EXCISION Superficial MASS NECK;  Surgeon: Fredirick Maudlin, MD;  Location: [facility] ORS;  Service: General;  Laterality: Right;  . EXCISION OF SKIN TAG Bilateral 06/12/2019   Procedure: EXCISION OF SKIN TAG on neck;  Surgeon: Fredirick Maudlin, MD;  Location: [facility] ORS;  Service: General;  Laterality: Bilateral;  . SHOULDER SURGERY Right 2014    Prior to Admission medications   Medication Sig Start Date End Date Taking? Authorizing Provider  ibuprofen (ADVIL) 800 MG tablet Take 1 tablet (800 mg total) by mouth every 8 (eight) hours as needed for mild pain or moderate pain. 06/12/19   Fredirick Maudlin, MD  lisinopril (ZESTRIL) 30 MG tablet TAKE 1 TABLET BY MOUTH EVERY DAY 02/03/20   Lavera Guise, MD  sildenafil (VIAGRA) 100 MG tablet Take 1 tablet (100 mg total) by mouth daily as needed for erectile dysfunction. 05/04/19   Kendell Bane, NP    Allergies Patient has no known allergies.  Family History  Problem Relation Age of Onset  . Breast cancer Mother   . Lung cancer Mother   . Cancer Maternal Uncle     Social History Social History   Tobacco Use  . Smoking status: Never Smoker  . Smokeless tobacco: Never Used  Vaping  Use  . Vaping Use: Never used  Substance Use Topics  . Alcohol use: No    Alcohol/week: 0.0 standard drinks  . Drug use: Never      Review of Systems Constitutional: No fever/chills, feeling stressed  Eyes: No visual changes. ENT: No sore throat. Cardiovascular: Positive chest pain Respiratory: Denies shortness of breath. Gastrointestinal: No abdominal pain.  No nausea, no vomiting.  No diarrhea.  No constipation. Genitourinary: Negative for dysuria. Musculoskeletal: Negative for back pain. Skin: Negative for rash. Neurological: Negative for headaches, focal weakness or numbness. All other ROS negative ____________________________________________   PHYSICAL EXAM:  VITAL SIGNS: ED Triage Vitals  Enc  Vitals Group     BP 06/29/20 0943 (!) 168/107     Pulse Rate 06/29/20 0943 82     Resp 06/29/20 0943 17     Temp 06/29/20 0943 98.3 F (36.8 C)     Temp Source 06/29/20 0943 Oral     SpO2 06/29/20 0943 98 %     Weight 06/29/20 0944 285 lb (129.3 kg)     Height 06/29/20 0944 6' (1.829 m)     Head Circumference --      Peak Flow --      Pain Score 06/29/20 0944 0     Pain Loc --      Pain Edu? --      Excl. in [place]? --     Constitutional: Alert and oriented. Well appearing and in no acute distress. Eyes: Conjunctivae are normal. EOMI. Head: Atraumatic. Nose: No congestion/rhinnorhea. Mouth/Throat: Mucous membranes are moist.   Neck: No stridor. Trachea Midline. FROM Cardiovascular: Normal rate, regular rhythm. Grossly normal heart sounds.  Good peripheral circulation. Respiratory: Normal respiratory effort.  No retractions. Lungs CTAB. Gastrointestinal: Soft and nontender. No distention. No abdominal bruits.  Musculoskeletal: No lower extremity tenderness nor edema.  No joint effusions. Neurologic:  Normal speech and language. No gross focal neurologic deficits are appreciated.  Skin:  Skin is warm, dry and intact. No rash noted. Psychiatric: Mood and affect are normal. Speech and behavior are normal. GU: Deferred   ____________________________________________   LABS (all labs ordered are listed, but only abnormal results are displayed)  Labs Reviewed  BASIC METABOLIC PANEL - Abnormal; Notable for the following components:      Result Value   Calcium 8.7 (*)    All other components within normal limits  CBC - Abnormal; Notable for the following components:   Hemoglobin 12.9 (*)    MCH 25.7 (*)    All other components within normal limits  TROPONIN I (HIGH SENSITIVITY)  TROPONIN I (HIGH SENSITIVITY)   ____________________________________________   ED ECG REPORT I, Vanessa [place], the attending physician, personally viewed and interpreted this ECG.  Normal sinus rate  of 78, no ST elevation but diffuse T wave inversions in 1 to aVL aVF V4 through V6.  Reviewed prior EKG and he has had similar T wave inversions ____________________________________________  RADIOLOGY Robert Bellow, personally viewed and evaluated these images (plain radiographs) as part of my medical decision making, as well as reviewing the written report by the radiologist.  ED MD interpretation:  No PNA   Official radiology report(s): DG Chest 2 View  Result Date: 06/29/2020 CLINICAL DATA:  Chest pain starting last night EXAM: CHEST - 2 VIEW COMPARISON:  03/08/2017 FINDINGS: Normal heart size and mediastinal contours. There is no edema, consolidation, effusion, or pneumothorax. No acute osseous finding. IMPRESSION: No evidence of active disease. Electronically  Signed   By: Monte Fantasia M.D.   On: 06/29/2020 10:26    ____________________________________________   PROCEDURES  Procedure(s) performed (including Critical Care):  .1-3 Lead EKG Interpretation Performed by: Vanessa [place], MD Authorized by: Vanessa [place], MD     Interpretation: normal     ECG rate:  60s    ECG rate assessment: normal     Rhythm: sinus rhythm     Ectopy: none     Conduction: normal       ____________________________________________   INITIAL IMPRESSION / ASSESSMENT AND PLAN / ED COURSE   Alexander Simon was evaluated in Emergency Department on 06/29/2020 for the symptoms described in the history of present illness. He was evaluated in the context of the global COVID-19 pandemic, which necessitated consideration that the patient might be at risk for infection with the SARS-CoV-2 virus that causes COVID-19. Institutional protocols and algorithms that pertain to the evaluation of patients at risk for COVID-19 are in a state of rapid change based on information released by regulatory bodies including the CDC and federal and state organizations. These policies and algorithms were followed during the  patient's care in the ED.    Most Likely DDx:  -MSK (atypical chest pain) but will get cardiac markers to evaluate for ACS given risk factors/age.  EKG but does have T wave inversions but I did compare it to his prior EKG looks very similar.  Had work-up 3 years ago with negative stress test.  We will give a dose of Tylenol but patient states that his pain is much better already.   DDx that was also considered d/t potential to cause harm, but was found less likely based on history and physical (as detailed above): -PNA (no fevers, cough but CXR to evaluate) -PNX (reassured with equal b/l breath sounds, CXR to evaluate) -Symptomatic anemia (will get H&H) -Pulmonary embolism as no sob at rest, not pleuritic in nature, no hypoxia -Aortic Dissection as no tearing pain and no radiation to the mid back, pulses equal -Pericarditis no rub on exam, EKG changes or hx to suggest dx -Tamponade (no notable SOB, tachycardic, hypotensive) -Esophageal rupture (no h/o diffuse vomitting/no crepitus)  1:57 PM repeat evaluation patient is chest pain-free.  Heart score is low blood pressure slightly elevated.  Will discharge patient home with cardiology follow-up.  Patient is slightly hypertensive and will discuss with cardiology and get a repeat to see if he needs additional blood pressure medicine.  I discussed the provisional nature of ED diagnosis, the treatment so far, the ongoing plan of care, follow up appointments and return precautions with the patient and any family or support people present. They expressed understanding and agreed with the plan, discharged home.        ____________________________________________   FINAL CLINICAL IMPRESSION(S) / ED DIAGNOSES   Final diagnoses:  Chest pain, unspecified type  Hypertension, unspecified type     MEDICATIONS GIVEN DURING THIS VISIT:  Medications  ibuprofen (ADVIL) tablet 600 mg (600 mg Oral Given 06/29/20 1221)     ED Discharge Orders     None       Note:  This document was prepared using Dragon voice recognition software and may include unintentional dictation errors.   Vanessa [place], MD 06/29/20 [phone]

## 2020-06-29 NOTE — Discharge Instructions (Addendum)
No evidence of heart attack but follow-up with cardiology to discuss due to your risk factors for heart attack.  Also discussed your elevated blood pressure.  Return to the ER for worsening chest pain or any other concerns

## 2020-11-29 ENCOUNTER — Encounter: Payer: Self-pay | Admitting: General Surgery

## 2021-05-24 ENCOUNTER — Other Ambulatory Visit: Payer: Self-pay

## 2021-05-24 ENCOUNTER — Ambulatory Visit
Admission: EM | Admit: 2021-05-24 | Discharge: 2021-05-24 | Disposition: A | Discharge status: Home / Self Care | Payer: Commercial Managed Care - PPO

## 2021-05-24 ENCOUNTER — Ambulatory Visit
Admission: EM | Admit: 2021-05-24 | Discharge: 2021-05-24 | Disposition: A | Discharge status: Home / Self Care | Payer: Commercial Managed Care - PPO | Attending: Physician Assistant | Admitting: Physician Assistant

## 2021-05-24 DIAGNOSIS — J209 Acute bronchitis, unspecified: Secondary | ICD-10-CM | POA: Diagnosis not present

## 2021-05-24 MED ORDER — AMOXICILLIN-POT CLAVULANATE 875-125 MG PO TABS: 1.0000 | ORAL_TABLET | Freq: Two times a day (BID) | ORAL | 0 refills | Status: AC

## 2021-05-24 NOTE — ED Triage Notes (Signed)
Patient presents to Urgent Care with complaints of sore throat and post nasal drip x 1 week. Treating symptoms with dayquil, nyquil, and mucinex. He states he is concerned with upper respiratory infection. Pt also c/o of abscess located on left forearm since a year. He states pain only when he overuses left arm.  ? ? ?Denies fever.  ?

## 2021-05-24 NOTE — ED Provider Notes (Signed)
?[facility] ? ? ? ?CSN: [id number] ?Arrival date & time: 05/24/21  1622 ? ? ?  ? ?History   ?Chief Complaint ?Chief Complaint  ?Patient presents with  ? Sore Throat  ? Abscess  ? ? ?HPI ?Alexander Simon is a 51 y.o. male.  ? ?Pt complains of a swollen area on his left arm.  Pt reports this area has been there for a year.  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  Pt also complains of a cough and congestion.  Pt reports he has had sinus congestion and pressure since returning from a trip 2 weeks ago.  Pt reports he has lost his voice.   ? ?The history is provided by the patient. No language interpreter was used.  ?Sore Throat ?This is a new problem. The problem occurs constantly. The problem has not changed since onset.Nothing aggravates the symptoms. Nothing relieves the symptoms.  ?Abscess ? ?Past Medical History:  ?Diagnosis Date  ? GERD (gastroesophageal reflux disease)   ? no meds  ? Hypertension   ? No pertinent past medical history   ? ? ?Patient Active Problem List  ? Diagnosis Date Noted  ? Mass of right side of neck   ? Skin tags, multiple acquired   ? Hypertension 02/20/2018  ? Chest pain 03/09/2017  ? ? ?Past Surgical History:  ?Procedure Laterality Date  ? CIRCUMCISION    ? EXCISION MASS NECK Right 06/12/2019  ? Procedure: EXCISION Superficial MASS NECK;  Surgeon: Fredirick Maudlin, MD;  Location: [facility];  Service: General;  Laterality: Right;  ? EXCISION OF SKIN TAG Bilateral 06/12/2019  ? Procedure: EXCISION OF SKIN TAG on neck;  Surgeon: Fredirick Maudlin, MD;  Location: [facility] ORS;  Service: General;  Laterality: Bilateral;  ? SHOULDER SURGERY Right 2014  ? ? ? ? ? ?Home Medications   ? ?Prior to Admission medications   ?Medication Sig Start Date End Date Taking? Authorizing Provider  ?amoxicillin-clavulanate (AUGMENTIN) 875-125 MG tablet Take 1 tablet by mouth 2 (two) times daily for 10 days. 05/24/21 06/03/21 Yes Fransico Meadow, PA-C  ?ibuprofen (ADVIL) 800 MG tablet  Take 1 tablet (800 mg total) by mouth every 8 (eight) hours as needed for mild pain or moderate pain. 06/12/19   Fredirick Maudlin, MD  ?lisinopril (ZESTRIL) 30 MG tablet TAKE 1 TABLET BY MOUTH EVERY DAY 02/03/20   Lavera Guise, MD  ?sildenafil (VIAGRA) 100 MG tablet Take 1 tablet (100 mg total) by mouth daily as needed for erectile dysfunction. 05/04/19   Kendell Bane, NP  ? ? ?Family History ?Family History  ?Problem Relation Age of Onset  ? Breast cancer Mother   ? Lung cancer Mother   ? Cancer Maternal Uncle   ? ? ?Social History ?Social History  ? ?Tobacco Use  ? Smoking status: Never  ? Smokeless tobacco: Never  ?Vaping Use  ? Vaping Use: Never used  ?Substance Use Topics  ? Alcohol use: No  ?  Alcohol/week: 0.0 standard drinks  ? Drug use: Never  ? ? ? ?Allergies   ?Patient has no known allergies. ? ? ?Review of Systems ?Review of Systems  ?All other systems reviewed and are negative. ? ? ?Physical Exam ?Triage Vital Signs ?ED Triage Vitals  ?Enc Vitals Group  ?   BP 05/24/21 1702 (!) 187/101  ?   Pulse --   ?   Resp 05/24/21 1702 16  ?   Temp 05/24/21 1704 98.9 ?  F (37.2 ?C)  ?   Temp Source 05/24/21 1704 Oral  ?   SpO2 05/24/21 1702 100 %  ?   Weight --   ?   Height --   ?   Head Circumference --   ?   Peak Flow --   ?   Pain Score 05/24/21 1701 0  ?   Pain Loc --   ?   Pain Edu? --   ?   Excl. in [place]? --   ? ?No data found. ? ?Updated Vital Signs ?BP (!) 187/101 (BP Location: Left Arm)   Temp 98.9 ?F (37.2 ?C) (Oral)   Resp 16   SpO2 100%  ? ?Visual Acuity ?Right Eye Distance:   ?Left Eye Distance:   ?Bilateral Distance:   ? ?Right Eye Near:   ?Left Eye Near:    ?Bilateral Near:    ? ?Physical Exam ?Vitals and nursing note reviewed.  ?Constitutional:   ?   Appearance: He is well-developed.  ?HENT:  ?   Head: Normocephalic.  ?   Right Ear: Tympanic membrane normal.  ?   Left Ear: Tympanic membrane normal.  ?   Nose: Congestion and rhinorrhea present.  ?   Mouth/Throat:  ?   Mouth: Mucous membranes are  moist.  ?Cardiovascular:  ?   Rate and Rhythm: Normal rate.  ?Pulmonary:  ?   Effort: Pulmonary effort is normal.  ?Abdominal:  ?   General: There is no distension.  ?Musculoskeletal:     ?   General: Normal range of motion.  ?   Cervical back: Normal range of motion.  ?Skin: ?   General: Skin is warm.  ?Neurological:  ?   Mental Status: He is alert and oriented to person, place, and time.  ?Psychiatric:     ?   Mood and Affect: Mood normal.  ? ? ? ?UC Treatments / Results  ?Labs ?(all labs ordered are listed, but only abnormal results are displayed) ?Labs Reviewed - No data to display ? ?EKG ? ? ?Radiology ?No results found. ? ?Procedures ?Procedures (including critical care time) ? ?Medications Ordered in UC ?Medications - No data to display ? ?Initial Impression / Assessment and Plan / UC Course  ?I have reviewed the triage vital signs and the nursing notes. ? ?Pertinent labs & imaging results that were available during my care of the patient were reviewed by me and considered in my medical decision making (see chart for details). ? ?  ? ?MDM:  Pt advised to schedule to see Orthopaedist for evaltuion of soft tissue mass.  Pt given rx for augmentin.  ?Final Clinical Impressions(s) / UC Diagnoses  ? ?Final diagnoses:  ?Acute bronchitis, unspecified organism  ? ? ? ?Discharge Instructions   ? ?  ?Return if any problems.  Schedule to see Orthopaedist for evaltuion of area of swelling on your forearm  ? ? ?ED Prescriptions   ? ? Medication Sig Dispense Auth. Provider  ? amoxicillin-clavulanate (AUGMENTIN) 875-125 MG tablet Take 1 tablet by mouth 2 (two) times daily for 10 days. 20 tablet Fransico Meadow, [place]  ? ?  ? ?PDMP not reviewed this encounter. ?An After Visit Summary was printed and given to the patient.  ?  ?Fransico Meadow, PA-C ?05/24/21 1738 ? ?

## 2021-05-24 NOTE — Discharge Instructions (Addendum)
Return if any problems.  Schedule to see Orthopaedist for evaltuion of area of swelling on your forearm  ?

## 2021-06-21 ENCOUNTER — Encounter (HOSPITAL_COMMUNITY): Payer: Self-pay

## 2021-06-21 ENCOUNTER — Ambulatory Visit
Admission: EM | Admit: 2021-06-21 | Discharge: 2021-06-21 | Disposition: A | Discharge status: Home / Self Care | Payer: Commercial Managed Care - PPO | Attending: Emergency Medicine | Admitting: Emergency Medicine

## 2021-06-21 DIAGNOSIS — I10 Essential (primary) hypertension: Secondary | ICD-10-CM | POA: Diagnosis not present

## 2021-06-21 DIAGNOSIS — Z760 Encounter for issue of repeat prescription: Secondary | ICD-10-CM | POA: Diagnosis not present

## 2021-06-21 MED ORDER — LISINOPRIL 30 MG PO TABS: 30.0000 mg | ORAL_TABLET | Freq: Every day | ORAL | 1 refills | Status: DC

## 2021-06-21 NOTE — ED Provider Notes (Signed)
HPI ? ?SUBJECTIVE: ? ?Alexander Simon is a 51 y.o. male who presents for medication refill.  States that he ran out of his lisinopril 5 days ago.  He states that his blood pressure has been running 150s/80s to 186/104.  He measures it daily.  He is completely asymptomatic.  Denies headache, visual changes, slurred speech, arm or leg weakness, discoordination, seizures, syncope, chest pain, shortness of breath, pain tearing through to his back, abdominal pain, lower extremity edema, anuria, hematuria.  He has been taking lisinopril for 2 to 3 years, states that it controls his blood pressure well.  States that he has had no side effects other than having erectile dysfunction when he started lisinopril, and is interested in transitioning to an ARB such as losartan.  He has a past medical history of hypertension, BMI above 30, and ED.  PCP: None ? ?Past Medical History:  ?Diagnosis Date  ? GERD (gastroesophageal reflux disease)   ? no meds  ? Hypertension   ? No pertinent past medical history   ? ? ?Past Surgical History:  ?Procedure Laterality Date  ? CIRCUMCISION    ? EXCISION MASS NECK Right 06/12/2019  ? Procedure: EXCISION Superficial MASS NECK;  Surgeon: Fredirick Maudlin, MD;  Location: [facility];  Service: General;  Laterality: Right;  ? EXCISION OF SKIN TAG Bilateral 06/12/2019  ? Procedure: EXCISION OF SKIN TAG on neck;  Surgeon: Fredirick Maudlin, MD;  Location: [facility];  Service: General;  Laterality: Bilateral;  ? SHOULDER SURGERY Right 2014  ? ? ?Family History  ?Problem Relation Age of Onset  ? Breast cancer Mother   ? Lung cancer Mother   ? Cancer Maternal Uncle   ? ? ?Social History  ? ?Tobacco Use  ? Smoking status: Never  ? Smokeless tobacco: Never  ?Vaping Use  ? Vaping Use: Never used  ?Substance Use Topics  ? Alcohol use: No  ?  Alcohol/week: 0.0 standard drinks  ? Drug use: Never  ? ? ?No current facility-administered medications for this encounter. ? ?Current Outpatient Medications:  ?  lisinopril  (ZESTRIL) 30 MG tablet, Take 1 tablet (30 mg total) by mouth daily., Disp: 30 tablet, Rfl: 1 ?  sildenafil (VIAGRA) 100 MG tablet, Take 1 tablet (100 mg total) by mouth daily as needed for erectile dysfunction., Disp: 90 tablet, Rfl: 0 ? ?No Known Allergies ? ? ?ROS ? ?As noted in HPI.  ? ?Physical Exam ? ?BP (!) 153/81 (BP Location: Left Arm)   Pulse 85   Temp 98.3 ?F (36.8 ?C) (Oral)   Resp 20   Ht 6' (1.829 m)   Wt 131.5 kg   SpO2 99%   BMI 39.33 kg/m?  ? ?Constitutional: Well developed, well nourished, no acute distress ?Eyes:  EOMI, conjunctiva normal bilaterally ?HENT: Normocephalic, atraumatic,mucus membranes moist ?Respiratory: Normal inspiratory effort, lungs clear bilaterally ?Cardiovascular: Normal rate, regular rhythm, no murmurs, rubs, gallop ?GI: nondistended ?skin: No rash, skin intact ?Musculoskeletal: no deformities ?Neurologic: Alert & oriented x 3, no focal neuro deficits ?Psychiatric: Speech and behavior appropriate ? ? ?ED Course ? ? ?Medications - No data to display ? ?Orders Placed This Encounter  ?Procedures  ? Nursing Communication Please set up primary care appointment prior to leaving  ?  Please set up primary care appointment prior to leaving  ?  Standing Status:   Standing  ?  Number of Occurrences:   1  ? ? ?No results found for this or any previous visit (from the past  24 hour(s)). ?No results found. ? ?ED Clinical Impression ? ?1. Essential hypertension   ?2. Encounter for medication refill   ?3. Hypertension, unspecified type   ?  ? ?ED Assessment/Plan ? ?Pt hypertensive today. States BP has been running in this range recently.  Has not taken BP meds in 5 days. Pt has no historical evidence of end organ damage.  As noted in HPI.  discussed  the importance of taking usual BP medications. Pt to f/u with primary care.  Will set up a primary care appointment prior to him leaving today. ? ?Patient would like to try losartan, since he has been experiencing erectile dysfunction since  starting lisinopril 2 to 3 years ago, however, discussed with patient that if the lisinopril was controlling his blood pressure well and he had no other side effects, that it would be best to transition to losartan or another blood pressure medication with primary care.  He is amenable to this. ? ?Discussed MDM, treatment plan, and plan for follow-up with patient. \patient agrees with plan.  ? ?Meds ordered this encounter  ?Medications  ? lisinopril (ZESTRIL) 30 MG tablet  ?  Sig: Take 1 tablet (30 mg total) by mouth daily.  ?  Dispense:  30 tablet  ?  Refill:  1  ? ? ? ? ?*This clinic note was created using [employer]. Therefore, there may be occasional mistakes despite careful proofreading. ? ?? ? ?  ?Melynda Ripple, MD ?06/21/21 1547 ? ?

## 2021-06-21 NOTE — ED Triage Notes (Signed)
Patient is here for "BP medication". Ran out and need Rx's. Last taken: [id number].  ?

## 2021-06-21 NOTE — Discharge Instructions (Addendum)
I am restarting your lisinopril.  Please keep a log of your blood pressure until you are seen by primary care.  Decrease your salt intake. diet and exercise will lower your blood pressure significantly. It is important to keep your blood pressure under good control, as having a elevated blood pressure for prolonged periods of time significantly increases your risk of stroke, heart attacks, kidney damage, eye damage, and other problems such as erectile dysfunction. Measure your blood pressure once a day, preferably at the same time every day. Keep a log of this and bring it to your next doctor's appointment.  Bring your blood pressure cuff as well.  Discuss transitioning to losartan with primary care. Return immediately to the ER if you start having chest pain, headache, problems seeing, problems talking, problems walking, if you feel like you're about to pass out, if you do pass out, if you have a seizure, or for any other concerns. ? ? Here is a list of primary care providers who are taking new patients: ? ?[facility] [place] ?Dr. Rosette Reveal (sports medicine) ?Dr. Otilio Miu [address] ?[place] [address] ?[phone] ? ?[facility] ?[address], [place] [address] ?[phone] ? ?[facility] [place] ?[place] [address]  ?[phone] ? ?[facility] ?[place], [place] [address] ?[phone] ? ?[facility] ?[place]  ?[phone] ?[place], [place] [address] ? ? ?Go to www.goodrx.com  or www.costplusdrugs.com to look up your medications. This will give you a list of where you can find your prescriptions at the most affordable prices. Or ask the pharmacist what the cash price is, or if they have any other discount programs available to help make your medication more affordable. This can be less expensive than what you would pay with insurance.  Marland Kitchen   ?

## 2021-07-27 ENCOUNTER — Telehealth: Payer: Commercial Managed Care - PPO | Admitting: Physician Assistant

## 2021-07-27 DIAGNOSIS — Z760 Encounter for issue of repeat prescription: Secondary | ICD-10-CM

## 2021-07-27 DIAGNOSIS — N529 Male erectile dysfunction, unspecified: Secondary | ICD-10-CM

## 2021-07-27 MED ORDER — SILDENAFIL CITRATE 100 MG PO TABS: 100.0000 mg | ORAL_TABLET | Freq: Every day | ORAL | 0 refills | Status: DC | PRN

## 2021-07-27 NOTE — Progress Notes (Signed)
Virtual Visit Consent   Alexander Simon, you are scheduled for a virtual visit with a [facility] provider today. Just as with appointments in the office, your consent must be obtained to participate. Your consent will be active for this visit and any virtual visit you may have with one of our providers in the next 365 days. If you have a MyChart account, a copy of this consent can be sent to you electronically.  As this is a virtual visit, video technology does not allow for your provider to perform a traditional examination. This may limit your provider's ability to fully assess your condition. If your provider identifies any concerns that need to be evaluated in person or the need to arrange testing (such as labs, EKG, etc.), we will make arrangements to do so. Although advances in technology are sophisticated, we cannot ensure that it will always work on either your end or our end. If the connection with a video visit is poor, the visit may have to be switched to a telephone visit. With either a video or telephone visit, we are not always able to ensure that we have a secure connection.  By engaging in this virtual visit, you consent to the provision of healthcare and authorize for your insurance to be billed (if applicable) for the services provided during this visit. Depending on your insurance coverage, you may receive a charge related to this service.  I need to obtain your verbal consent now. Are you willing to proceed with your visit today? Alexander Simon has provided verbal consent on 07/27/2021 for a virtual visit (video or telephone). Leeanne Rio, [place]  Date: 07/27/2021 12:45 PM  Virtual Visit via Video Note   I, Leeanne Rio, PA-C, attempted to connect with Alexander Simon; MRN [id number] on 07/27/21 via [employer] to complete a video urgent care visit. The patient was unable to successfully connect to the video platform. As such, the patient was contacted by this provider via  phone to complete the encounter.   Location: Patient: Virtual Visit Location Patient: Home Provider: Virtual Visit Location Provider: Home Office   I discussed the limitations of evaluation and management by telemedicine and the availability of in person appointments. The patient expressed understanding and agreed to proceed.    History of Present Illness: Alexander Simon is a 51 y.o. who identifies as a male who was assigned male at birth, and is being seen today for medication refill. Currently on a regimen of sildenafil 100 mg as needed for ED. Has used for several years without issue. Ran out and his PCP left the practice so he cannot get a refill until establishing with a new provider.  Taking his lisinopril 30 mg daily for BP. Notes checking BP at home with readings < 140/90- at home. Patient denies chest pain, palpitations, lightheadedness, dizziness, vision changes or frequent headaches.   HPI: HPI  Problems:  Patient Active Problem List   Diagnosis Date Noted   Mass of right side of neck    Skin tags, multiple acquired    Hypertension 02/20/2018   Chest pain 03/09/2017    Allergies: No Known Allergies Medications:  Current Outpatient Medications:    lisinopril (ZESTRIL) 30 MG tablet, Take 1 tablet (30 mg total) by mouth daily., Disp: 30 tablet, Rfl: 1   sildenafil (VIAGRA) 100 MG tablet, Take 1 tablet (100 mg total) by mouth daily as needed for erectile dysfunction., Disp: 90 tablet, Rfl: 0  Observations/Objective: Patient is  well-developed, well-nourished in no acute distress.  Resting comfortably at home.  Head is normocephalic, atraumatic.  No labored breathing. Speech is clear and coherent with logical content.  Patient is alert and oriented at baseline.   Assessment and Plan: 1. Medication refill  2. Erectile dysfunction, unspecified erectile dysfunction type - sildenafil (VIAGRA) 100 MG tablet; Take 1 tablet (100 mg total) by mouth daily as needed for erectile  dysfunction.  Dispense: 90 tablet; Refill: 0  Discussed will give one-time refill of medication  until he can establish with his new PCP, since he is doing well with BP and keeping check on measurements at home, and has been on this medication for some time.  Follow Up Instructions: I discussed the assessment and treatment plan with the patient. The patient was provided an opportunity to ask questions and all were answered. The patient agreed with the plan and demonstrated an understanding of the instructions.  A copy of instructions were sent to the patient via MyChart unless otherwise noted below.    The patient was advised to call back or seek an in-person evaluation if the symptoms worsen or if the condition fails to improve as anticipated.  Time:  I spent 10 minutes with the patient via telehealth technology discussing the above problems/concerns.    Leeanne Rio, PA-C

## 2021-09-01 ENCOUNTER — Telehealth: Payer: Commercial Managed Care - PPO | Admitting: Physician Assistant

## 2021-09-01 DIAGNOSIS — I10 Essential (primary) hypertension: Secondary | ICD-10-CM

## 2021-09-01 MED ORDER — LISINOPRIL 30 MG PO TABS: 30.0000 mg | ORAL_TABLET | Freq: Every day | ORAL | 0 refills | Status: DC

## 2021-09-01 NOTE — Patient Instructions (Signed)
Alexander Simon, thank you for joining Mar Daring, PA-C for today's virtual visit.  While this provider is not your primary care provider (PCP), if your PCP is located in our provider database this encounter information will be shared with them immediately following your visit.  Consent: (Patient) Alexander Simon provided verbal consent for this virtual visit at the beginning of the encounter.  Current Medications:  Current Outpatient Medications:    lisinopril (ZESTRIL) 30 MG tablet, Take 1 tablet (30 mg total) by mouth daily., Disp: 90 tablet, Rfl: 0   sildenafil (VIAGRA) 100 MG tablet, Take 1 tablet (100 mg total) by mouth daily as needed for erectile dysfunction., Disp: 90 tablet, Rfl: 0   Medications ordered in this encounter:  Meds ordered this encounter  Medications   lisinopril (ZESTRIL) 30 MG tablet    Sig: Take 1 tablet (30 mg total) by mouth daily.    Dispense:  90 tablet    Refill:  0    Order Specific Question:   Supervising Provider    Answer:   Sabra Heck, BRIAN [[id number]]     *If you need refills on other medications prior to your next appointment, please contact your pharmacy*  Follow-Up: Call back or seek an in-person evaluation if the symptoms worsen or if the condition fails to improve as anticipated.  Other Instructions DASH Eating Plan DASH stands for Dietary Approaches to Stop Hypertension. The DASH eating plan is a healthy eating plan that has been shown to: Reduce high blood pressure (hypertension). Reduce your risk for type 2 diabetes, heart disease, and stroke. Help with weight loss. What are tips for following this plan? Reading food labels Check food labels for the amount of salt (sodium) per serving. Choose foods with less than 5 percent of the Daily Value of sodium. Generally, foods with less than 300 milligrams (mg) of sodium per serving fit into this eating plan. To find whole grains, look for the word "whole" as the first word in the ingredient  list. Shopping Buy products labeled as "low-sodium" or "no salt added." Buy fresh foods. Avoid canned foods and pre-made or frozen meals. Cooking Avoid adding salt when cooking. Use salt-free seasonings or herbs instead of table salt or sea salt. Check with your health care provider or pharmacist before using salt substitutes. Do not fry foods. Cook foods using healthy methods such as baking, boiling, grilling, roasting, and broiling instead. Cook with heart-healthy oils, such as olive, canola, avocado, soybean, or sunflower oil. Meal planning  Eat a balanced diet that includes: 4 or more servings of fruits and 4 or more servings of vegetables each day. Try to fill one-half of your plate with fruits and vegetables. 6-8 servings of whole grains each day. Less than 6 oz (170 g) of lean meat, poultry, or fish each day. A 3-oz (85-g) serving of meat is about the same size as a deck of cards. One egg equals 1 oz (28 g). 2-3 servings of low-fat dairy each day. One serving is 1 cup (237 mL). 1 serving of nuts, seeds, or beans 5 times each week. 2-3 servings of heart-healthy fats. Healthy fats called omega-3 fatty acids are found in foods such as walnuts, flaxseeds, fortified milks, and eggs. These fats are also found in cold-water fish, such as sardines, salmon, and mackerel. Limit how much you eat of: Canned or prepackaged foods. Food that is high in trans fat, such as some fried foods. Food that is high in saturated fat, such as  fatty meat. Desserts and other sweets, sugary drinks, and other foods with added sugar. Full-fat dairy products. Do not salt foods before eating. Do not eat more than 4 egg yolks a week. Try to eat at least 2 vegetarian meals a week. Eat more home-cooked food and less restaurant, buffet, and fast food. Lifestyle When eating at a restaurant, ask that your food be prepared with less salt or no salt, if possible. If you drink alcohol: Limit how much you use to: 0-1  drink a day for women who are not pregnant. 0-2 drinks a day for men. Be aware of how much alcohol is in your drink. In the [place] one drink equals one 12 oz bottle of beer (355 mL), one 5 oz glass of wine (148 mL), or one 1 oz glass of hard liquor (44 mL). General information Avoid eating more than 2,300 mg of salt a day. If you have hypertension, you may need to reduce your sodium intake to 1,500 mg a day. Work with your health care provider to maintain a healthy body weight or to lose weight. Ask what an ideal weight is for you. Get at least 30 minutes of exercise that causes your heart to beat faster (aerobic exercise) most days of the week. Activities may include walking, swimming, or biking. Work with your health care provider or dietitian to adjust your eating plan to your individual calorie needs. What foods should I eat? Fruits All fresh, dried, or frozen fruit. Canned fruit in natural juice (without added sugar). Vegetables Fresh or frozen vegetables (raw, steamed, roasted, or grilled). Low-sodium or reduced-sodium tomato and vegetable juice. Low-sodium or reduced-sodium tomato sauce and tomato paste. Low-sodium or reduced-sodium canned vegetables. Grains Whole-grain or whole-wheat bread. Whole-grain or whole-wheat pasta. Brown rice. [place]. Bulgur. Whole-grain and low-sodium cereals. Pita bread. Low-fat, low-sodium crackers. Whole-wheat flour tortillas. Meats and other proteins Skinless chicken or [place]. Ground chicken or [place]. Pork with fat trimmed off. Fish and seafood. Egg whites. Dried beans, peas, or lentils. Unsalted nuts, nut butters, and seeds. Unsalted canned beans. Lean cuts of beef with fat trimmed off. Low-sodium, lean precooked or cured meat, such as sausages or meat loaves. Dairy Low-fat (1%) or fat-free (skim) milk. Reduced-fat, low-fat, or fat-free cheeses. Nonfat, low-sodium ricotta or cottage cheese. Low-fat or nonfat yogurt. Low-fat, low-sodium  cheese. Fats and oils Soft margarine without trans fats. Vegetable oil. Reduced-fat, low-fat, or light mayonnaise and salad dressings (reduced-sodium). Canola, safflower, olive, avocado, soybean, and sunflower oils. Avocado. Seasonings and condiments Herbs. Spices. Seasoning mixes without salt. Other foods Unsalted popcorn and pretzels. Fat-free sweets. The items listed above may not be a complete list of foods and beverages you can eat. Contact a dietitian for more information. What foods should I avoid? Fruits Canned fruit in a light or heavy syrup. Fried fruit. Fruit in cream or butter sauce. Vegetables Creamed or fried vegetables. Vegetables in a cheese sauce. Regular canned vegetables (not low-sodium or reduced-sodium). Regular canned tomato sauce and paste (not low-sodium or reduced-sodium). Regular tomato and vegetable juice (not low-sodium or reduced-sodium). Angie Fava. Olives. Grains Baked goods made with fat, such as croissants, muffins, or some breads. Dry pasta or rice meal packs. Meats and other proteins Fatty cuts of meat. Ribs. Fried meat. Berniece Salines. Bologna, salami, and other precooked or cured meats, such as sausages or meat loaves. Fat from the back of a pig (fatback). Bratwurst. Salted nuts and seeds. Canned beans with added salt. Canned or smoked fish. Whole eggs or egg yolks.  Chicken or [place] with skin. Dairy Whole or 2% milk, cream, and half-and-half. Whole or full-fat cream cheese. Whole-fat or sweetened yogurt. Full-fat cheese. Nondairy creamers. Whipped toppings. Processed cheese and cheese spreads. Fats and oils Butter. Stick margarine. Lard. Shortening. Ghee. Bacon fat. Tropical oils, such as coconut, palm kernel, or palm oil. Seasonings and condiments Onion salt, garlic salt, seasoned salt, table salt, and sea salt. Worcestershire sauce. Tartar sauce. Barbecue sauce. Teriyaki sauce. Soy sauce, including reduced-sodium. Steak sauce. Canned and packaged gravies. Fish sauce.  Oyster sauce. Cocktail sauce. Store-bought horseradish. Ketchup. Mustard. Meat flavorings and tenderizers. Bouillon cubes. Hot sauces. Pre-made or packaged marinades. Pre-made or packaged taco seasonings. Relishes. Regular salad dressings. Other foods Salted popcorn and pretzels. The items listed above may not be a complete list of foods and beverages you should avoid. Contact a dietitian for more information. Where to find more information National Heart, Lung, and Blood Institute: https://wilson-eaton.com/ American Heart Association: www.heart.org Academy of Nutrition and Dietetics: www.eatright.[place]: www.kidney.org Summary The DASH eating plan is a healthy eating plan that has been shown to reduce high blood pressure (hypertension). It may also reduce your risk for type 2 diabetes, heart disease, and stroke. When on the DASH eating plan, aim to eat more fresh fruits and vegetables, whole grains, lean proteins, low-fat dairy, and heart-healthy fats. With the DASH eating plan, you should limit salt (sodium) intake to 2,300 mg a day. If you have hypertension, you may need to reduce your sodium intake to 1,500 mg a day. Work with your health care provider or dietitian to adjust your eating plan to your individual calorie needs. This information is not intended to replace advice given to you by your health care provider. Make sure you discuss any questions you have with your health care provider. Document Revised: 01/16/2019 Document Reviewed: 01/16/2019 Elsevier Patient Education  [place].    If you have been instructed to have an in-person evaluation today at a local Urgent Care facility, please use the link below. It will take you to a list of all of our available [facility], including address, phone number and hours of operation. Please do not delay care.  [facility]  If you or a family member do not have a primary care provider, use  the link below to schedule a visit and establish care. When you choose a [facility] primary care physician or advanced practice provider, you gain a long-term partner in health. Find a Primary Care Provider  Learn more about 's in-office and virtual care options: [facility]

## 2021-09-01 NOTE — Progress Notes (Signed)
Virtual Visit Consent   Alexander Simon, you are scheduled for a virtual visit with a [facility] provider today. Just as with appointments in the office, your consent must be obtained to participate. Your consent will be active for this visit and any virtual visit you may have with one of our providers in the next 365 days. If you have a MyChart account, a copy of this consent can be sent to you electronically.  As this is a virtual visit, video technology does not allow for your provider to perform a traditional examination. This may limit your provider's ability to fully assess your condition. If your provider identifies any concerns that need to be evaluated in person or the need to arrange testing (such as labs, EKG, etc.), we will make arrangements to do so. Although advances in technology are sophisticated, we cannot ensure that it will always work on either your end or our end. If the connection with a video visit is poor, the visit may have to be switched to a telephone visit. With either a video or telephone visit, we are not always able to ensure that we have a secure connection.  By engaging in this virtual visit, you consent to the provision of healthcare and authorize for your insurance to be billed (if applicable) for the services provided during this visit. Depending on your insurance coverage, you may receive a charge related to this service.  I need to obtain your verbal consent now. Are you willing to proceed with your visit today? Alexander Simon has provided verbal consent on 09/01/2021 for a virtual visit (video or telephone). Mar Daring, PA-C  Date: 09/01/2021 8:44 AM  Virtual Visit via Video Note   I, Mar Daring, connected with  Alexander Simon  ([id number], May 20, 1970) on 09/01/21 at  8:45 AM EDT by a video-enabled telemedicine application and verified that I am speaking with the correct person using two identifiers.  Location: Patient: Virtual Visit Location  Patient: Mobile Provider: Virtual Visit Location Provider: Home Office   I discussed the limitations of evaluation and management by telemedicine and the availability of in person appointments. The patient expressed understanding and agreed to proceed.    History of Present Illness: Alexander Simon is a 51 y.o. who identifies as a male who was assigned male at birth, and is being seen today for medication refills. Requesting a refill of Lisinopril '30mg'$  daily. Reports home BP readings are always less than 140/90. He is not having any side effects. He did have a PCP that left the practice. He is needing to re-establish with a new provider. He does have a card with their information already.    Problems:  Patient Active Problem List   Diagnosis Date Noted   Mass of right side of neck    Skin tags, multiple acquired    Hypertension 02/20/2018   Chest pain 03/09/2017    Allergies: No Known Allergies Medications:  Current Outpatient Medications:    lisinopril (ZESTRIL) 30 MG tablet, Take 1 tablet (30 mg total) by mouth daily., Disp: 90 tablet, Rfl: 0   sildenafil (VIAGRA) 100 MG tablet, Take 1 tablet (100 mg total) by mouth daily as needed for erectile dysfunction., Disp: 90 tablet, Rfl: 0  Observations/Objective: Patient is well-developed, well-nourished in no acute distress.  Resting comfortably  Head is normocephalic, atraumatic.  No labored breathing.  Speech is clear and coherent with logical content.  Patient is alert and oriented at baseline.  Assessment and Plan: 1. Hypertension, unspecified type - lisinopril (ZESTRIL) 30 MG tablet; Take 1 tablet (30 mg total) by mouth daily.  Dispense: 90 tablet; Refill: 0  - Medication refilled x 90 days to establish with new provider - He agrees and is going to call for an appt now after the video call to have one set in the next 3 months.  Follow Up Instructions: I discussed the assessment and treatment plan with the patient. The  patient was provided an opportunity to ask questions and all were answered. The patient agreed with the plan and demonstrated an understanding of the instructions.  A copy of instructions were sent to the patient via MyChart unless otherwise noted below.    The patient was advised to call back or seek an in-person evaluation if the symptoms worsen or if the condition fails to improve as anticipated.  Time:  I spent 10 minutes with the patient via telehealth technology discussing the above problems/concerns.    Mar Daring, PA-C

## 2021-12-09 ENCOUNTER — Telehealth: Payer: Commercial Managed Care - PPO | Admitting: Family Medicine

## 2021-12-09 DIAGNOSIS — I159 Secondary hypertension, unspecified: Secondary | ICD-10-CM

## 2021-12-09 NOTE — Progress Notes (Signed)
Pt did not show for visit and had no answer at phone number. DWB

## 2021-12-10 ENCOUNTER — Ambulatory Visit
Admission: EM | Admit: 2021-12-10 | Discharge: 2021-12-10 | Disposition: A | Discharge status: Home / Self Care | Payer: Commercial Managed Care - PPO | Attending: Physician Assistant | Admitting: Physician Assistant

## 2021-12-10 DIAGNOSIS — Z760 Encounter for issue of repeat prescription: Secondary | ICD-10-CM

## 2021-12-10 DIAGNOSIS — I10 Essential (primary) hypertension: Secondary | ICD-10-CM | POA: Diagnosis not present

## 2021-12-10 MED ORDER — LISINOPRIL 30 MG PO TABS: 30.0000 mg | ORAL_TABLET | Freq: Every day | ORAL | 0 refills | Status: DC

## 2021-12-10 NOTE — ED Triage Notes (Signed)
Pt c/o high blood pressure x1day  Pt states that he ran out of his blood pressure medication - Lisinopril '30mg'$   Pt has an upcoming appointment for Novemeber 2nd, 2023.  Pt is having lightheadedness, fatigue, and dizziness.

## 2021-12-10 NOTE — ED Provider Notes (Signed)
[facility]    CSN: [id number] Arrival date & time: 12/10/21  1143      History   Chief Complaint Chief Complaint  Patient presents with   Hypertension    HPI Alexander Simon is a 51 y.o. male with history of hypertension.  He says that he needs a refill of lisinopril 30 mg tablets which he takes daily.  He reports that he ran out of this medication 2 days ago.  He says that when he runs out of his medication he becomes more fatigued and has headaches and feels dizzy.  He says did not really having any other symptoms at this time but it comes and goes.  He reports that he supposed to be set up with a new PCP on November 03/2021 in [place].  He reports that he would like a 90-day supply of his blood pressure medication at this time.  He is not reporting any severe headaches, nausea/vomiting chest pain, palpitations, shortness of breath or leg swelling.  HPI  Past Medical History:  Diagnosis Date   GERD (gastroesophageal reflux disease)    no meds   Hypertension    No pertinent past medical history     Patient Active Problem List   Diagnosis Date Noted   Mass of right side of neck    Skin tags, multiple acquired    Hypertension 02/20/2018   Chest pain 03/09/2017    Past Surgical History:  Procedure Laterality Date   CIRCUMCISION     EXCISION MASS NECK Right 06/12/2019   Procedure: EXCISION Superficial MASS NECK;  Surgeon: Fredirick Maudlin, MD;  Location: [facility] ORS;  Service: General;  Laterality: Right;   EXCISION OF SKIN TAG Bilateral 06/12/2019   Procedure: EXCISION OF SKIN TAG on neck;  Surgeon: Fredirick Maudlin, MD;  Location: [facility] OR[facility];  Service: General;  Laterality: Bilateral;   SHOULDER SURGERY Right 2014       Home Medications    Prior to Admission medications   Medication Sig Start Date End Date Taking? Authorizing Provider  sildenafil (VIAGRA) 100 MG tablet Take 1 tablet (100 mg total) by mouth daily as needed for erectile  dysfunction. 07/27/21  Yes Brunetta Jeans, PA-C  lisinopril (ZESTRIL) 30 MG tablet Take 1 tablet (30 mg total) by mouth daily. 12/10/21 01/09/22  [place]anton Clap, PA-C    Family History Family History  Problem Relation Age of Onset   Breast cancer Mother    Lung cancer Mother    Cancer Maternal Uncle     Social History Social History   Tobacco Use   Smoking status: Never   Smokeless tobacco: Never  Vaping Use   Vaping Use: Never used  Substance Use Topics   Alcohol use: No    Alcohol/week: 0.0 standard drinks of alcohol   Drug use: Never     Allergies   Patient has no known allergies.   Review of Systems Review of Systems  Constitutional:  Positive for fatigue. Negative for fever.  Eyes:  Negative for visual disturbance.  Respiratory:  Negative for shortness of breath.   Cardiovascular:  Negative for chest pain, palpitations and leg swelling.  Gastrointestinal:  Negative for abdominal pain, nausea and vomiting.  Neurological:  Positive for dizziness and light-headedness. Negative for syncope, weakness, numbness and headaches.     Physical Exam Triage Vital Signs ED Triage Vitals  Enc Vitals Group     BP 12/10/21 1205 (!) 172/104     Pulse Rate 12/10/21  1205 79     Resp 12/10/21 1205 18     Temp 12/10/21 1205 98.7 F (37.1 C)     Temp Source 12/10/21 1205 Oral     SpO2 12/10/21 1205 96 %     Weight 12/10/21 1207 282 lb (127.9 kg)     Height 12/10/21 1207 6' (1.829 m)     Head Circumference --      Peak Flow --      Pain Score 12/10/21 1207 0     Pain Loc --      Pain Edu? --      Excl. in [place]? --    No data found.  Updated Vital Signs BP (!) 143/94 (BP Location: Left Arm)   Pulse 79   Temp 98.7 F (37.1 C) (Oral)   Resp 18   Ht 6' (1.829 m)   Wt 282 lb (127.9 kg)   SpO2 96%   BMI 38.25 kg/m      Physical Exam Vitals and nursing note reviewed.  Constitutional:      General: He is not in acute distress.    Appearance: Normal appearance. He  is well-developed. He is obese. He is not ill-appearing.  HENT:     Head: Normocephalic and atraumatic.     Nose: Nose normal.     Mouth/Throat:     Mouth: Mucous membranes are moist.     Pharynx: Oropharynx is clear.  Eyes:     General: No scleral icterus.    Conjunctiva/sclera: Conjunctivae normal.  Cardiovascular:     Rate and Rhythm: Normal rate and regular rhythm.     Heart sounds: Normal heart sounds.  Pulmonary:     Effort: Pulmonary effort is normal. No respiratory distress.     Breath sounds: Normal breath sounds.  Musculoskeletal:     Cervical back: Neck supple.  Skin:    General: Skin is warm and dry.     Capillary Refill: Capillary refill takes less than 2 seconds.  Neurological:     General: No focal deficit present.     Mental Status: He is alert. Mental status is at baseline.     Motor: No weakness.     Gait: Gait normal.  Psychiatric:        Mood and Affect: Mood normal.        Behavior: Behavior normal.      UC Treatments / Results  Labs (all labs ordered are listed, but only abnormal results are displayed) Labs Reviewed - No data to display  EKG   Radiology No results found.  Procedures Procedures (including critical care time)  Medications Ordered in UC Medications - No data to display  Initial Impression / Assessment and Plan / UC Course  I have reviewed the triage vital signs and the nursing notes.  Pertinent labs & imaging results that were available during my care of the patient were reviewed by me and considered in my medical decision making (see chart for details).   51 year old male presents for lisinopril refill.  He has a history of hypertension and has been out of his medication for couple days.  Scheduled to see a new PCP in about 2 to 3 weeks.  Reports intermittent headaches, dizziness/lightheadedness and fatigue.  Also reports that he has been working all weekend so that could be contributing to the symptoms.  Not reporting any  chest pain, palpitations, shortness of breath or leg swelling.  No severe headaches, numbness/tingling or weakness.  BP initially 173/104.  Recheck is 143/94.  Other vitals normal and stable.  Exam benign today.  Refilled lisinopril and advised him to keep his appointment with his PCP.  Advised him we only prescribe a 30-day supply and not a 90-day supply.  He will need to follow-up with his new PCP.  Discussed ED precautions and handout.   Final Clinical Impressions(s) / UC Diagnoses   Final diagnoses:  Essential hypertension  Medication refill     Discharge Instructions      -I refilled your blood pressure medication.  Continue to take it. - Follow-up with your new PCP next month. - Go to ER if you have any uncontrollable blood pressures or develop severe headaches, dizziness, weakness, vomiting, chest pain, shortness of breath, palpitations, etc.     ED Prescriptions     Medication Sig Dispense Auth. Provider   lisinopril (ZESTRIL) 30 MG tablet Take 1 tablet (30 mg total) by mouth daily. 30 tablet Gretta Cool      PDMP not reviewed this encounter.   Danton Clap, PA-C 12/10/21 1255

## 2021-12-10 NOTE — Discharge Instructions (Signed)
-  I refilled your blood pressure medication.  Continue to take it. - Follow-up with your new PCP next month. - Go to ER if you have any uncontrollable blood pressures or develop severe headaches, dizziness, weakness, vomiting, chest pain, shortness of breath, palpitations, etc.

## 2022-03-08 ENCOUNTER — Ambulatory Visit
Admission: EM | Admit: 2022-03-08 | Discharge: 2022-03-08 | Disposition: A | Discharge status: Home / Self Care | Payer: Commercial Managed Care - PPO | Attending: Emergency Medicine | Admitting: Emergency Medicine

## 2022-03-08 ENCOUNTER — Encounter: Payer: Self-pay | Admitting: Emergency Medicine

## 2022-03-08 DIAGNOSIS — R059 Cough, unspecified: Secondary | ICD-10-CM | POA: Insufficient documentation

## 2022-03-08 DIAGNOSIS — Z1152 Encounter for screening for COVID-19: Secondary | ICD-10-CM | POA: Diagnosis not present

## 2022-03-08 DIAGNOSIS — J069 Acute upper respiratory infection, unspecified: Secondary | ICD-10-CM | POA: Diagnosis present

## 2022-03-08 LAB — RESP PANEL BY RT-PCR (RSV, FLU A&B, COVID)  RVPGX2
Influenza A by PCR: NEGATIVE
Influenza B by PCR: NEGATIVE
Resp Syncytial Virus by PCR: NEGATIVE
SARS Coronavirus 2 by RT PCR: NEGATIVE

## 2022-03-08 NOTE — Discharge Instructions (Signed)
Your symptoms today are most likely being caused by a virus and should steadily improve in time it can take up to 7 to 10 days before you truly start to see a turnaround however things will get better  Covid, flu and rsv negative     You can take Tylenol and/or Ibuprofen as needed for fever reduction and pain relief.   For cough: honey 1/2 to 1 teaspoon (you can dilute the honey in water or another fluid).  You can also use guaifenesin and dextromethorphan for cough. You can use a humidifier for chest congestion and cough.  If you don't have a humidifier, you can sit in the bathroom with the hot shower running.      For sore throat: try warm salt water gargles, cepacol lozenges, throat spray, warm tea or water with lemon/honey, popsicles or ice, or OTC cold relief medicine for throat discomfort.   For congestion: take a daily anti-histamine like Zyrtec, Claritin, and a oral decongestant, such as pseudoephedrine.  You can also use Flonase 1-2 sprays in each nostril daily.   It is important to stay hydrated: drink plenty of fluids (water, gatorade/powerade/pedialyte, juices, or teas) to keep your throat moisturized and help further relieve irritation/discomfort.

## 2022-03-08 NOTE — ED Triage Notes (Signed)
Pt presents with cough and runny nose x 2 days.

## 2022-03-08 NOTE — ED Provider Notes (Signed)
[facility]    CSN: [id number] Arrival date & time: 03/08/22  1550      History   Chief Complaint Chief Complaint  Patient presents with   Cough   Nasal Congestion    HPI Alexander Simon is a 52 y.o. male.   Patient presents for evaluation of nasal congestion, rhinorrhea and a nonproductive cough present for 2 days.  Known sick contact at work.  Tolerating food and liquids.  Has been managing symptoms with DayQuil, NyQuil and Mucinex drops which have been helpful.  Denies shortness of breath, wheezing, fever, chills or bodyaches.    Past Medical History:  Diagnosis Date   GERD (gastroesophageal reflux disease)    no meds   Hypertension    No pertinent past medical history     Patient Active Problem List   Diagnosis Date Noted   Mass of right side of neck    Skin tags, multiple acquired    Hypertension 02/20/2018   Chest pain 03/09/2017    Past Surgical History:  Procedure Laterality Date   CIRCUMCISION     EXCISION MASS NECK Right 06/12/2019   Procedure: EXCISION Superficial MASS NECK;  Surgeon: Fredirick Maudlin, MD;  Location: [facility] ORS;  Service: General;  Laterality: Right;   EXCISION OF SKIN TAG Bilateral 06/12/2019   Procedure: EXCISION OF SKIN TAG on neck;  Surgeon: Fredirick Maudlin, MD;  Location: [facility] ORS;  Service: General;  Laterality: Bilateral;   SHOULDER SURGERY Right 2014       Home Medications    Prior to Admission medications   Medication Sig Start Date End Date Taking? Authorizing Provider  lisinopril (ZESTRIL) 30 MG tablet Take 1 tablet (30 mg total) by mouth daily. 12/10/21 03/08/22 Yes Danton Clap, PA-C  sildenafil (VIAGRA) 100 MG tablet Take 1 tablet (100 mg total) by mouth daily as needed for erectile dysfunction. 07/27/21  Yes Brunetta Jeans, PA-C    Family History Family History  Problem Relation Age of Onset   Breast cancer Mother    Lung cancer Mother    Cancer Maternal Uncle     Social History Social History    Tobacco Use   Smoking status: Never   Smokeless tobacco: Never  Vaping Use   Vaping Use: Never used  Substance Use Topics   Alcohol use: No    Alcohol/week: 0.0 standard drinks of alcohol   Drug use: Never     Allergies   Patient has no known allergies.   Review of Systems Review of Systems  Constitutional: Negative.   HENT:  Positive for congestion and rhinorrhea. Negative for dental problem, drooling, ear discharge, ear pain, facial swelling, hearing loss, mouth sores, nosebleeds, postnasal drip, sinus pressure, sinus pain, sneezing, sore throat, tinnitus, trouble swallowing and voice change.   Respiratory:  Positive for cough. Negative for apnea, choking, chest tightness, shortness of breath, wheezing and stridor.   Cardiovascular: Negative.   Gastrointestinal: Negative.   Skin: Negative.   Neurological: Negative.      Physical Exam Triage Vital Signs ED Triage Vitals  Enc Vitals Group     BP 03/08/22 1602 (!) 188/101     Pulse Rate 03/08/22 1602 86     Resp 03/08/22 1602 18     Temp 03/08/22 1602 98.6 F (37 C)     Temp Source 03/08/22 1602 Oral     SpO2 03/08/22 1602 99 %     Weight --      Height --  Head Circumference --      Peak Flow --      Pain Score 03/08/22 1601 0     Pain Loc --      Pain Edu? --      Excl. in [place]? --    No data found.  Updated Vital Signs BP (!) 188/101 (BP Location: Left Arm)   Pulse 86   Temp 98.6 F (37 C) (Oral)   Resp 18   SpO2 99%   Visual Acuity Right Eye Distance:   Left Eye Distance:   Bilateral Distance:    Right Eye Near:   Left Eye Near:    Bilateral Near:     Physical Exam Constitutional:      Appearance: Normal appearance.  HENT:     Head: Normocephalic.     Right Ear: Tympanic membrane, ear canal and external ear normal.     Left Ear: Tympanic membrane, ear canal and external ear normal.     Nose: Congestion and rhinorrhea present.     Mouth/Throat:     Mouth: Mucous membranes are moist.      Pharynx: Posterior oropharyngeal erythema present.  Cardiovascular:     Rate and Rhythm: Normal rate and regular rhythm.     Pulses: Normal pulses.     Heart sounds: Normal heart sounds.  Pulmonary:     Effort: Pulmonary effort is normal.     Breath sounds: Normal breath sounds.  Neurological:     Mental Status: He is alert.      UC Treatments / Results  Labs (all labs ordered are listed, but only abnormal results are displayed) Labs Reviewed  RESP PANEL BY RT-PCR (RSV, FLU A&B, COVID)  RVPGX2    EKG   Radiology No results found.  Procedures Procedures (including critical care time)  Medications Ordered in UC Medications - No data to display  Initial Impression / Assessment and Plan / UC Course  I have reviewed the triage vital signs and the nursing notes.  Pertinent labs & imaging results that were available during my care of the patient were reviewed by me and considered in my medical decision making (see chart for details).  Viral URI with cough  Patient is in no signs of distress nor toxic appearing.  Vital signs are stable.  Low suspicion for pneumonia, pneumothorax or bronchitis and therefore will defer imaging.  COVID, flu and RSV testing negative May use additional over-the-counter medications as needed for supportive care.  May follow-up with urgent care as needed if symptoms persist or worsen.  Note given.   Final Clinical Impressions(s) / UC Diagnoses   Final diagnoses:  None   Discharge Instructions   None    ED Prescriptions   None    PDMP not reviewed this encounter.   Hans Eden, NP 03/08/22 [phone]

## 2022-03-16 ENCOUNTER — Encounter (HOSPITAL_COMMUNITY): Payer: Self-pay

## 2022-04-15 ENCOUNTER — Telehealth: Payer: Commercial Managed Care - PPO | Admitting: Nurse Practitioner

## 2022-04-15 DIAGNOSIS — I10 Essential (primary) hypertension: Secondary | ICD-10-CM

## 2022-04-15 MED ORDER — LISINOPRIL 30 MG PO TABS: 30.0000 mg | ORAL_TABLET | Freq: Every day | ORAL | 0 refills | Status: DC

## 2022-04-15 NOTE — Progress Notes (Signed)
Virtual Visit Consent   Alexander Simon, you are scheduled for a virtual visit with a [facility] provider today. Just as with appointments in the office, your consent must be obtained to participate. Your consent will be active for this visit and any virtual visit you may have with one of our providers in the next 365 days. If you have a MyChart account, a copy of this consent can be sent to you electronically.  As this is a virtual visit, video technology does not allow for your provider to perform a traditional examination. This may limit your provider's ability to fully assess your condition. If your provider identifies any concerns that need to be evaluated in person or the need to arrange testing (such as labs, EKG, etc.), we will make arrangements to do so. Although advances in technology are sophisticated, we cannot ensure that it will always work on either your end or our end. If the connection with a video visit is poor, the visit may have to be switched to a telephone visit. With either a video or telephone visit, we are not always able to ensure that we have a secure connection.  By engaging in this virtual visit, you consent to the provision of healthcare and authorize for your insurance to be billed (if applicable) for the services provided during this visit. Depending on your insurance coverage, you may receive a charge related to this service.  I need to obtain your verbal consent now. Are you willing to proceed with your visit today? Alexander Simon has provided verbal consent on 04/15/2022 for a virtual visit (video or telephone). [name], NP  Date: 04/15/2022 10:26 AM  Virtual Visit via Video Note   I, [name], connected with  Alexander Simon  ([id number], 12-Mar-1970) on 04/15/22 at 10:15 AM EST by a video-enabled telemedicine application and verified that I am speaking with the correct person using two identifiers.  Location: Patient: Virtual Visit Location Patient:  Home Provider: Virtual Visit Location Provider: Home Office   I discussed the limitations of evaluation and management by telemedicine and the availability of in person appointments. The patient expressed understanding and agreed to proceed.    History of Present Illness: Alexander Simon is a 52 y.o. who identifies as a male who was assigned male at birth, and is being seen today for medication refill.  Mr. Laubenthal has a history of HTN. Requesting refill of lisinopril as he has not been able to get in with a PCP to establish care.  He does not endorse chest pain, headache or shortness of breath.  Problems:  Patient Active Problem List   Diagnosis Date Noted   Mass of right side of neck    Skin tags, multiple acquired    Hypertension 02/20/2018   Chest pain 03/09/2017    Allergies: No Known Allergies Medications:  Current Outpatient Medications:    lisinopril (ZESTRIL) 30 MG tablet, Take 1 tablet (30 mg total) by mouth daily., Disp: 30 tablet, Rfl: 0   sildenafil (VIAGRA) 100 MG tablet, Take 1 tablet (100 mg total) by mouth daily as needed for erectile dysfunction., Disp: 90 tablet, Rfl: 0  Observations/Objective: Patient is well-developed, well-nourished in no acute distress.  Resting comfortably at home.  Head is normocephalic, atraumatic.  No labored breathing.  Speech is clear and coherent with logical content.  Patient is alert and oriented at baseline.    Assessment and Plan: 1. Hypertension, unspecified type - lisinopril (ZESTRIL) 30  MG tablet; Take 1 tablet (30 mg total) by mouth daily.  Dispense: 30 tablet; Refill: 0  Instructed Mr. Teater that it is imperative he find a PCP as lisinopril can affect the kidneys and he has not had any bloodwork obtained in over a year. I also informed him there was a mychart message send on 03-16-2022 instructing him how to schedule to establish care with a new PCP.    Follow Up Instructions: I discussed the assessment and treatment plan with  the patient. The patient was provided an opportunity to ask questions and all were answered. The patient agreed with the plan and demonstrated an understanding of the instructions.  A copy of instructions were sent to the patient via MyChart unless otherwise noted below.     The patient was advised to call back or seek an in-person evaluation if the symptoms worsen or if the condition fails to improve as anticipated.  Time:  I spent 11 minutes with the patient via telehealth technology discussing the above problems/concerns.    [name], NP

## 2022-04-15 NOTE — Patient Instructions (Addendum)
  Alexander Simon, thank you for joining [name], NP for today's virtual visit.  While this provider is not your primary care provider (PCP), if your PCP is located in our provider database this encounter information will be shared with them immediately following your visit.   [facility] account gives you access to today's visit and all your visits, tests, and labs performed at [facility] " click here if you don't have a [facility] account or go to [email or web address]  Consent: (Patient) Alexander Simon provided verbal consent for this virtual visit at the beginning of the encounter.  Current Medications:  Current Outpatient Medications:    lisinopril (ZESTRIL) 30 MG tablet, Take 1 tablet (30 mg total) by mouth daily., Disp: 30 tablet, Rfl: 0   sildenafil (VIAGRA) 100 MG tablet, Take 1 tablet (100 mg total) by mouth daily as needed for erectile dysfunction., Disp: 90 tablet, Rfl: 0   Medications ordered in this encounter:  Meds ordered this encounter  Medications   lisinopril (ZESTRIL) 30 MG tablet    Sig: Take 1 tablet (30 mg total) by mouth daily.    Dispense:  30 tablet    Refill:  0    Order Specific Question:   Supervising Provider    Answer:   Chase Picket [id number]     *If you need refills on other medications prior to your next appointment, please contact your pharmacy*  Follow-Up: Call back or seek an in-person evaluation if the symptoms worsen or if the condition fails to improve as anticipated.  [facility] [phone]  Other Instructions Instructed [name] that it is imperative he find a PCP as lisinopril can affect the kidneys and he has not had any bloodwork obtained in over a year. I also informed him there was a mychart message send on 03-16-2022 instructing him how to schedule to establish care with a new PC   If you have been instructed to have an in-person evaluation today at a local Urgent Care  facility, please use the link below. It will take you to a list of all of our available [facility], including address, phone number and hours of operation. Please do not delay care.  [facility]  If you or a family member do not have a primary care provider, use the link below to schedule a visit and establish care. When you choose a [facility] primary care physician or advanced practice provider, you gain a long-term partner in health. Find a Primary Care Provider  Learn more about [facility]'s in-office and virtual care options: [facility]

## 2022-05-11 ENCOUNTER — Other Ambulatory Visit: Payer: Self-pay | Admitting: Nurse Practitioner

## 2022-05-11 DIAGNOSIS — I10 Essential (primary) hypertension: Secondary | ICD-10-CM

## 2022-06-06 ENCOUNTER — Other Ambulatory Visit: Payer: Self-pay | Admitting: Nurse Practitioner

## 2022-06-06 DIAGNOSIS — I10 Essential (primary) hypertension: Secondary | ICD-10-CM

## 2022-06-12 ENCOUNTER — Telehealth: Payer: Commercial Managed Care - PPO

## 2022-06-16 ENCOUNTER — Other Ambulatory Visit: Payer: Self-pay | Admitting: Nurse Practitioner

## 2022-06-16 ENCOUNTER — Telehealth: Payer: Commercial Managed Care - PPO | Admitting: Nurse Practitioner

## 2022-06-16 DIAGNOSIS — I10 Essential (primary) hypertension: Secondary | ICD-10-CM

## 2022-06-16 MED ORDER — LISINOPRIL 30 MG PO TABS: 30.0000 mg | ORAL_TABLET | Freq: Every day | ORAL | 0 refills | Status: DC

## 2022-06-16 NOTE — Progress Notes (Signed)
Virtual Visit Consent   Alexander Simon, you are scheduled for a virtual visit with a [facility] provider today. Just as with appointments in the office, your consent must be obtained to participate. Your consent will be active for this visit and any virtual visit you may have with one of our providers in the next 365 days. If you have a MyChart account, a copy of this consent can be sent to you electronically.  As this is a virtual visit, video technology does not allow for your provider to perform a traditional examination. This may limit your provider's ability to fully assess your condition. If your provider identifies any concerns that need to be evaluated in person or the need to arrange testing (such as labs, EKG, etc.), we will make arrangements to do so. Although advances in technology are sophisticated, we cannot ensure that it will always work on either your end or our end. If the connection with a video visit is poor, the visit may have to be switched to a telephone visit. With either a video or telephone visit, we are not always able to ensure that we have a secure connection.  By engaging in this virtual visit, you consent to the provision of healthcare and authorize for your insurance to be billed (if applicable) for the services provided during this visit. Depending on your insurance coverage, you may receive a charge related to this service.  I need to obtain your verbal consent now. Are you willing to proceed with your visit today? Alexander Simon has provided verbal consent on 06/16/2022 for a virtual visit (video or telephone). Claiborne Rigg, NP  Date: 06/16/2022 10:12 AM  Virtual Visit via Video Note   I, Claiborne Rigg, connected with  Alexander Simon  ([id number], 1970-12-06) on 06/16/22 at 10:15 AM EDT by a video-enabled telemedicine application and verified that I am speaking with the correct person using two identifiers.  Location: Patient: Virtual Visit Location Patient:  Home Provider: Virtual Visit Location Provider: Home Office   I discussed the limitations of evaluation and management by telemedicine and the availability of in person appointments. The patient expressed understanding and agreed to proceed.    History of Present Illness: Alexander Simon is a 52 y.o. who identifies as a male who was assigned male at birth, and is being seen today for medication refill request.  He is requesting refill of his lisinopril . Would like 90 day refill however he still has not established with a primary care since his last virtual visit. I instructed him that he needs to find a primary care. A mychart message on obtaining a PCP was sent to him 02-2022. Blood pressure today 164/97. Previous readings: 161/85 and 155/81  Not well controlled. He was instructed that he needs a PCP to adjust his medications at this time. Also no recent CMP/BMP on file so I do not feel comfortable adjusting his lisinopril today.    Problems:  Patient Active Problem List   Diagnosis Date Noted   Mass of right side of neck    Skin tags, multiple acquired    Hypertension 02/20/2018   Chest pain 03/09/2017    Allergies: No Known Allergies Medications:  Current Outpatient Medications:    lisinopril (ZESTRIL) 30 MG tablet, Take 1 tablet (30 mg total) by mouth daily., Disp: 30 tablet, Rfl: 0   sildenafil (VIAGRA) 100 MG tablet, Take 1 tablet (100 mg total) by mouth daily as needed for erectile dysfunction.,  Disp: 90 tablet, Rfl: 0  Observations/Objective: Patient is well-developed, well-nourished in no acute distress.  Resting comfortably at home.  Head is normocephalic, atraumatic.  No labored breathing.  Speech is clear and coherent with logical content.  Patient is alert and oriented at baseline.    Assessment and Plan: 1. Primary hypertension - lisinopril (ZESTRIL) 30 MG tablet; Take 1 tablet (30 mg total) by mouth daily.  Dispense: 30 tablet; Refill: 0  He was instructed that  he needs a PCP to adjust his medications at this time. Also no recent CMP/BMP on file so I do not feel comfortable adjusting his lisinopril today.   Follow Up Instructions: I discussed the assessment and treatment plan with the patient. The patient was provided an opportunity to ask questions and all were answered. The patient agreed with the plan and demonstrated an understanding of the instructions.  A copy of instructions were sent to the patient via MyChart unless otherwise noted below.    The patient was advised to call back or seek an in-person evaluation if the symptoms worsen or if the condition fails to improve as anticipated.  Time:  I spent 11 minutes with the patient via telehealth technology discussing the above problems/concerns.    Claiborne Rigg, NP

## 2022-06-16 NOTE — Patient Instructions (Signed)
  Alexander Simon, thank you for joining Claiborne Rigg, NP for today's virtual visit.  While this provider is not your primary care provider (PCP), if your PCP is located in our provider database this encounter information will be shared with them immediately following your visit.   [facility] MyChart account gives you access to today's visit and all your visits, tests, and labs performed at [facility] " click here if you don't have a [facility] MyChart account or go to [email or web address]  Consent: (Patient) Alexander Simon provided verbal consent for this virtual visit at the beginning of the encounter.  Current Medications:  Current Outpatient Medications:    lisinopril (ZESTRIL) 30 MG tablet, Take 1 tablet (30 mg total) by mouth daily., Disp: 30 tablet, Rfl: 0   sildenafil (VIAGRA) 100 MG tablet, Take 1 tablet (100 mg total) by mouth daily as needed for erectile dysfunction., Disp: 90 tablet, Rfl: 0   Medications ordered in this encounter:  Meds ordered this encounter  Medications   lisinopril (ZESTRIL) 30 MG tablet    Sig: Take 1 tablet (30 mg total) by mouth daily.    Dispense:  30 tablet    Refill:  0    Order Specific Question:   Supervising Provider    Answer:   Merrilee Jansky [id number]     *If you need refills on other medications prior to your next appointment, please contact your pharmacy*  Follow-Up: Call back or seek an in-person evaluation if the symptoms worsen or if the condition fails to improve as anticipated.  [facility] [phone]  Other Instructions  He was instructed that he needs a PCP to adjust his medications at this time. Also no recent CMP/BMP on file so I do not feel comfortable adjusting his lisinopril today.    If you have been instructed to have an in-person evaluation today at a local Urgent Care facility, please use the link below. It will take you to a list of all of our available [facility], including address, phone number and hours of operation. Please do not delay care.  [facility]  If you or a family member do not have a primary care provider, use the link below to schedule a visit and establish care. When you choose a [facility] primary care physician or advanced practice provider, you gain a long-term partner in health. Find a Primary Care Provider  Learn more about [facility]'s in-office and virtual care options: [facility] - Get Care Now

## 2022-07-16 ENCOUNTER — Telehealth: Payer: Commercial Managed Care - PPO | Admitting: Physician Assistant

## 2022-07-16 ENCOUNTER — Encounter: Payer: Self-pay | Admitting: Physician Assistant

## 2022-07-16 DIAGNOSIS — I10 Essential (primary) hypertension: Secondary | ICD-10-CM

## 2022-07-16 IMAGING — CR DG CHEST 2V
1 series · 2 of 2 positions shown · non-contrast
Comparison: 03/08/2017

CLINICAL DATA: Chest pain starting last night

EXAM:
CHEST - 2 VIEW

[Series 1: dg chest 2 view · 0.14mm/px · 2 of 2 slices shown]
[im 1/2]
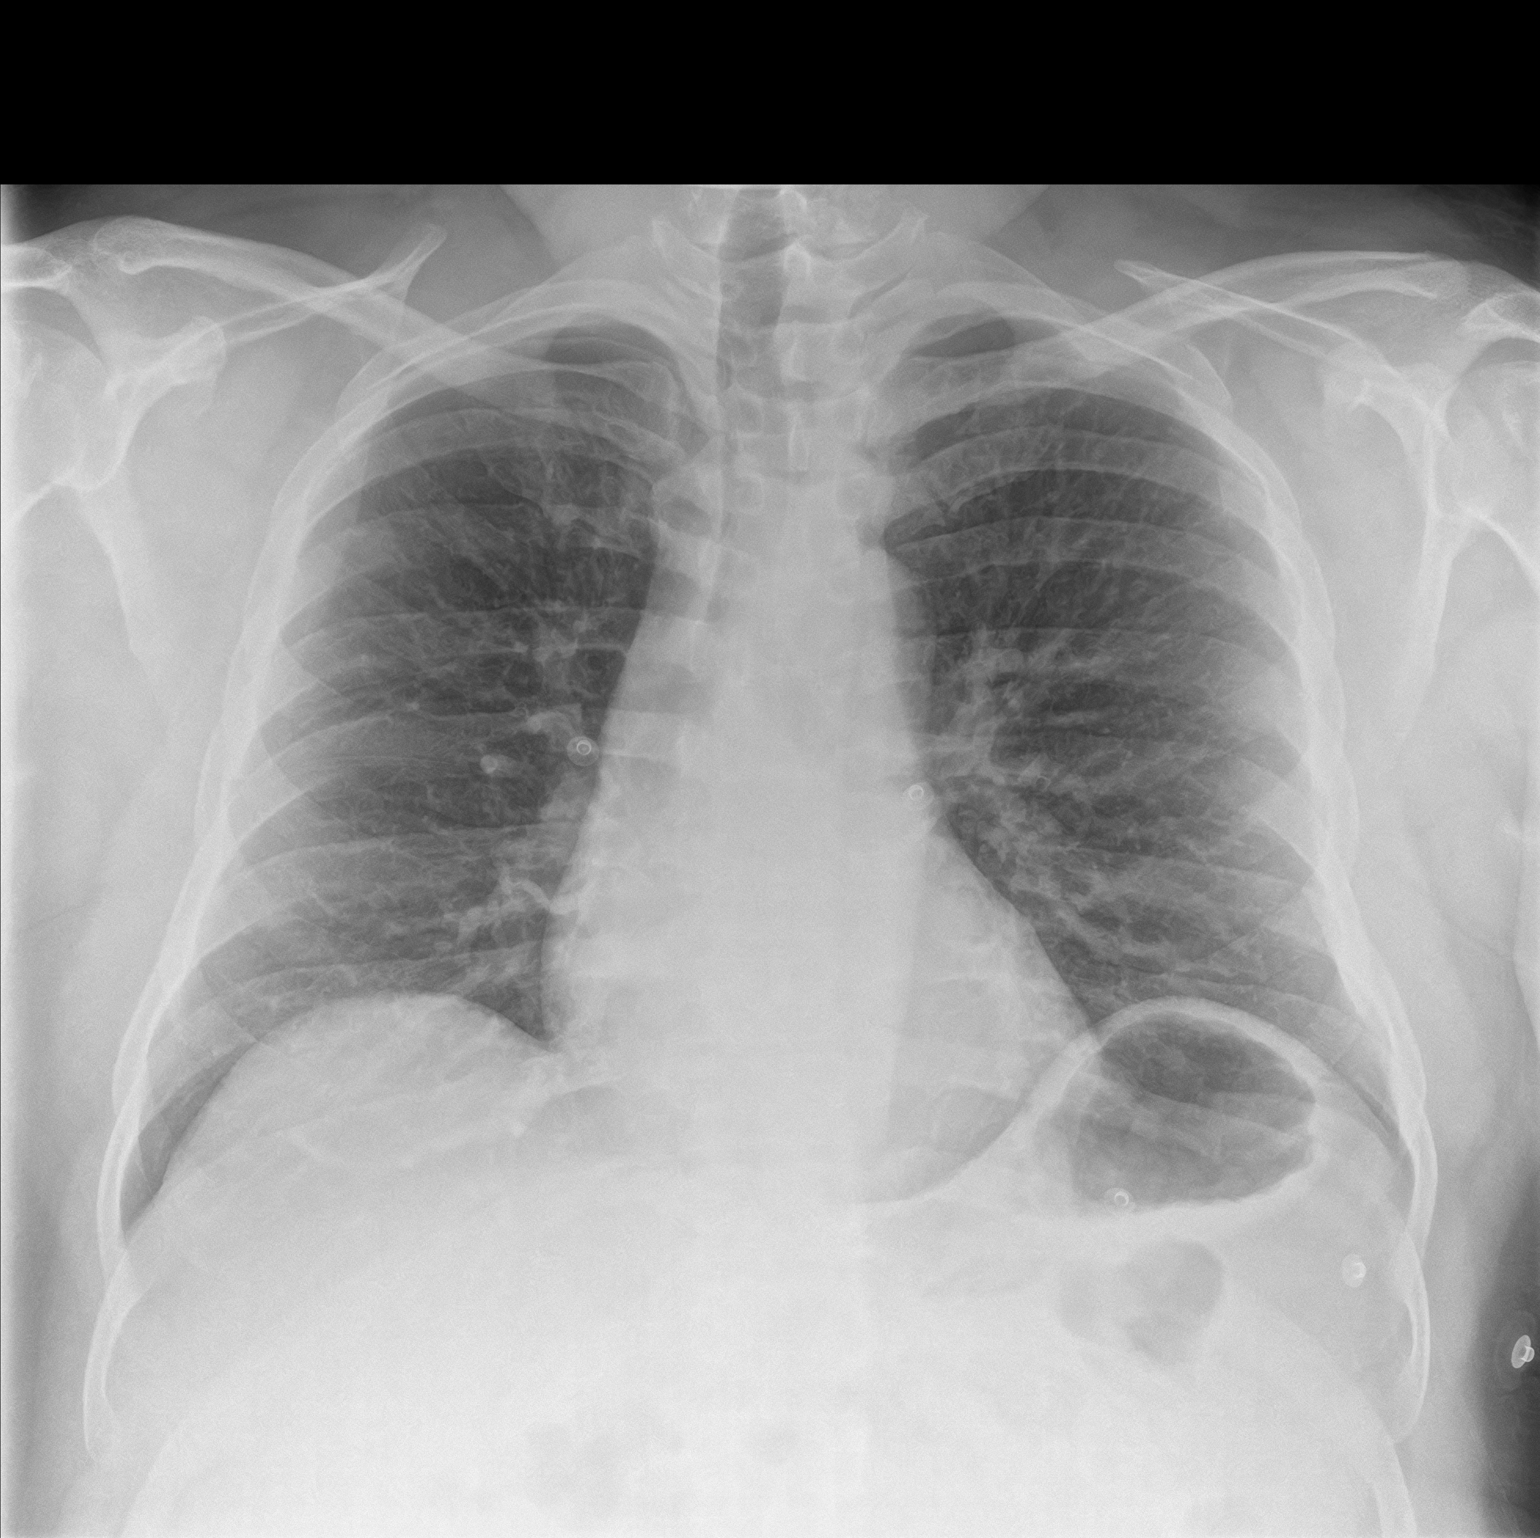
[im 2/2]
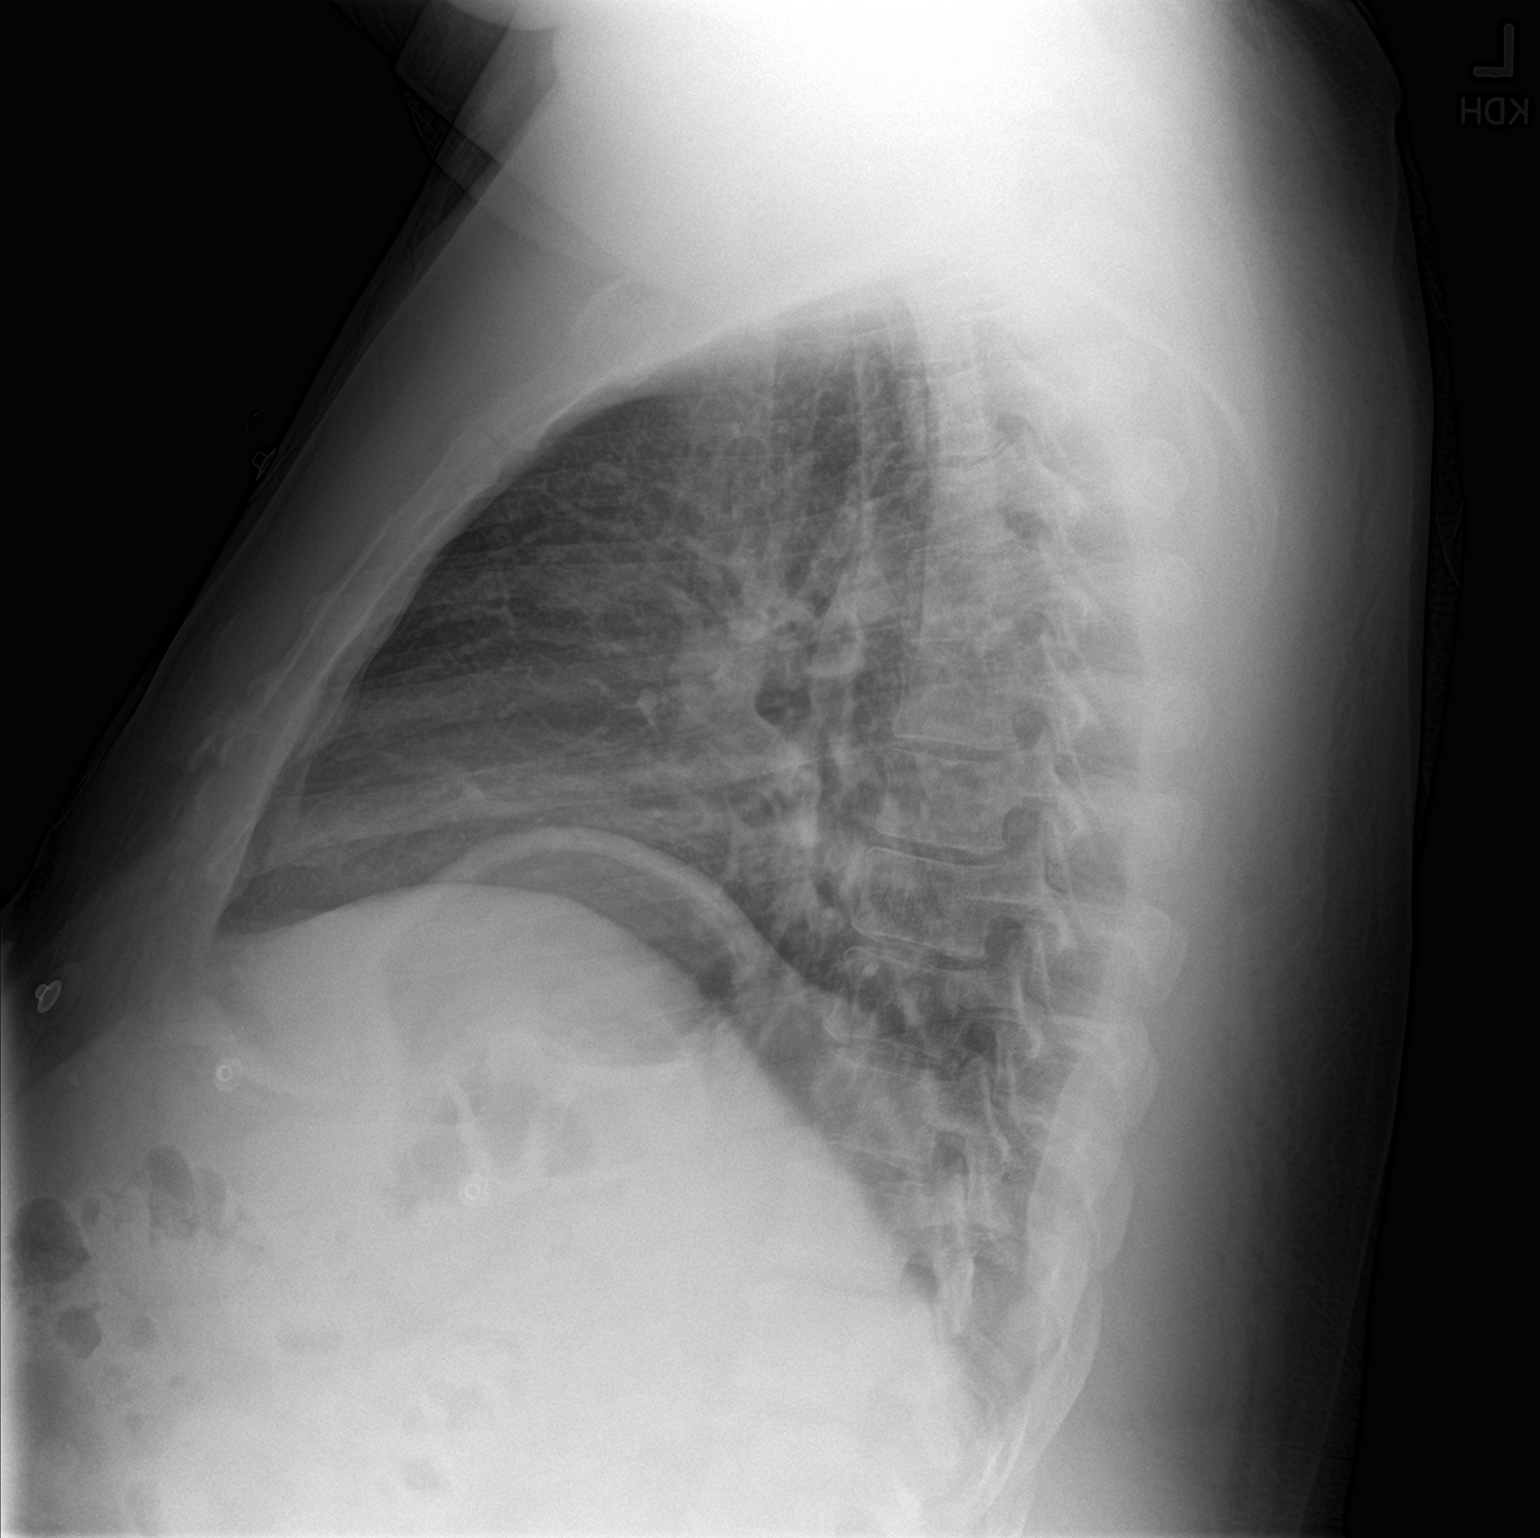

[2 of 2 positions shown; findings below may reference images not displayed]

FINDINGS: Normal heart size and mediastinal contours. There is no edema,
consolidation, effusion, or pneumothorax. No acute osseous finding.
IMPRESSION: No evidence of active disease.

## 2022-07-16 MED ORDER — LISINOPRIL 30 MG PO TABS: 30.0000 mg | ORAL_TABLET | Freq: Every day | ORAL | 0 refills | Status: DC

## 2022-07-16 NOTE — Patient Instructions (Signed)
Alexander Simon, thank you for joining Margaretann Loveless, PA-C for today's virtual visit.  While this provider is not your primary care provider (PCP), if your PCP is located in our provider database this encounter information will be shared with them immediately following your visit.   A [facility] MyChart account gives you access to today's visit and all your visits, tests, and labs performed at [facility] " click here if you don't have a [facility] MyChart account or go to [email or web address]  Consent: (Patient) Alexander Simon provided verbal consent for this virtual visit at the beginning of the encounter.  Current Medications:  Current Outpatient Medications:    lisinopril (ZESTRIL) 30 MG tablet, Take 1 tablet (30 mg total) by mouth daily., Disp: 30 tablet, Rfl: 0   sildenafil (VIAGRA) 100 MG tablet, Take 1 tablet (100 mg total) by mouth daily as needed for erectile dysfunction., Disp: 90 tablet, Rfl: 0   Medications ordered in this encounter:  Meds ordered this encounter  Medications   lisinopril (ZESTRIL) 30 MG tablet    Sig: Take 1 tablet (30 mg total) by mouth daily.    Dispense:  30 tablet    Refill:  0    Order Specific Question:   Supervising Provider    Answer:   Merrilee Jansky [id number]     *If you need refills on other medications prior to your next appointment, please contact your pharmacy*  Follow-Up: Call back or seek an in-person evaluation if the symptoms worsen or if the condition fails to improve as anticipated.  [facility] [phone]  Other Instructions DASH Eating Plan DASH stands for Dietary Approaches to Stop Hypertension. The DASH eating plan is a healthy eating plan that has been shown to: Reduce high blood pressure (hypertension). Reduce your risk for type 2 diabetes, heart disease, and stroke. Help with weight loss. What are tips for following this plan? Reading food labels Check food labels for the  amount of salt (sodium) per serving. Choose foods with less than 5 percent of the Daily Value of sodium. Generally, foods with less than 300 milligrams (mg) of sodium per serving fit into this eating plan. To find whole grains, look for the word "whole" as the first word in the ingredient list. Shopping Buy products labeled as "low-sodium" or "no salt added." Buy fresh foods. Avoid canned foods and pre-made or frozen meals. Cooking Avoid adding salt when cooking. Use salt-free seasonings or herbs instead of table salt or sea salt. Check with your health care provider or pharmacist before using salt substitutes. Do not fry foods. Cook foods using healthy methods such as baking, boiling, grilling, roasting, and broiling instead. Cook with heart-healthy oils, such as olive, canola, avocado, soybean, or sunflower oil. Meal planning  Eat a balanced diet that includes: 4 or more servings of fruits and 4 or more servings of vegetables each day. Try to fill one-half of your plate with fruits and vegetables. 6-8 servings of whole grains each day. Less than 6 oz (170 g) of lean meat, poultry, or fish each day. A 3-oz (85-g) serving of meat is about the same size as a deck of cards. One egg equals 1 oz (28 g). 2-3 servings of low-fat dairy each day. One serving is 1 cup (237 mL). 1 serving of nuts, seeds, or beans 5 times each week. 2-3 servings of heart-healthy fats. Healthy fats called omega-3 fatty acids are found in foods such as walnuts, flaxseeds, fortified  milks, and eggs. These fats are also found in cold-water fish, such as sardines, salmon, and mackerel. Limit how much you eat of: Canned or prepackaged foods. Food that is high in trans fat, such as some fried foods. Food that is high in saturated fat, such as fatty meat. Desserts and other sweets, sugary drinks, and other foods with added sugar. Full-fat dairy products. Do not salt foods before eating. Do not eat more than 4 egg yolks a  week. Try to eat at least 2 vegetarian meals a week. Eat more home-cooked food and less restaurant, buffet, and fast food. Lifestyle When eating at a restaurant, ask that your food be prepared with less salt or no salt, if possible. If you drink alcohol: Limit how much you use to: 0-1 drink a day for women who are not pregnant. 0-2 drinks a day for men. Be aware of how much alcohol is in your drink. In the [place] one drink equals one 12 oz bottle of beer (355 mL), one 5 oz glass of wine (148 mL), or one 1 oz glass of hard liquor (44 mL). General information Avoid eating more than 2,300 mg of salt a day. If you have hypertension, you may need to reduce your sodium intake to 1,500 mg a day. Work with your health care provider to maintain a healthy body weight or to lose weight. Ask what an ideal weight is for you. Get at least 30 minutes of exercise that causes your heart to beat faster (aerobic exercise) most days of the week. Activities may include walking, swimming, or biking. Work with your health care provider or dietitian to adjust your eating plan to your individual calorie needs. What foods should I eat? Fruits All fresh, dried, or frozen fruit. Canned fruit in natural juice (without added sugar). Vegetables Fresh or frozen vegetables (raw, steamed, roasted, or grilled). Low-sodium or reduced-sodium tomato and vegetable juice. Low-sodium or reduced-sodium tomato sauce and tomato paste. Low-sodium or reduced-sodium canned vegetables. Grains Whole-grain or whole-wheat bread. Whole-grain or whole-wheat pasta. Brown rice. Orpah Cobb. Bulgur. Whole-grain and low-sodium cereals. Pita bread. Low-fat, low-sodium crackers. Whole-wheat flour tortillas. Meats and other proteins Skinless chicken or [place]. Ground chicken or [place]. Pork with fat trimmed off. Fish and seafood. Egg whites. Dried beans, peas, or lentils. Unsalted nuts, nut butters, and seeds. Unsalted canned beans. Lean cuts of  beef with fat trimmed off. Low-sodium, lean precooked or cured meat, such as sausages or meat loaves. Dairy Low-fat (1%) or fat-free (skim) milk. Reduced-fat, low-fat, or fat-free cheeses. Nonfat, low-sodium ricotta or cottage cheese. Low-fat or nonfat yogurt. Low-fat, low-sodium cheese. Fats and oils Soft margarine without trans fats. Vegetable oil. Reduced-fat, low-fat, or light mayonnaise and salad dressings (reduced-sodium). Canola, safflower, olive, avocado, soybean, and sunflower oils. Avocado. Seasonings and condiments Herbs. Spices. Seasoning mixes without salt. Other foods Unsalted popcorn and pretzels. Fat-free sweets. The items listed above may not be a complete list of foods and beverages you can eat. Contact a dietitian for more information. What foods should I avoid? Fruits Canned fruit in a light or heavy syrup. Fried fruit. Fruit in cream or butter sauce. Vegetables Creamed or fried vegetables. Vegetables in a cheese sauce. Regular canned vegetables (not low-sodium or reduced-sodium). Regular canned tomato sauce and paste (not low-sodium or reduced-sodium). Regular tomato and vegetable juice (not low-sodium or reduced-sodium). Rosita Fire. Olives. Grains Baked goods made with fat, such as croissants, muffins, or some breads. Dry pasta or rice meal packs. Meats and other proteins Fatty  cuts of meat. Ribs. Fried meat. Tomasa Blase. Bologna, salami, and other precooked or cured meats, such as sausages or meat loaves. Fat from the back of a pig (fatback). Bratwurst. Salted nuts and seeds. Canned beans with added salt. Canned or smoked fish. Whole eggs or egg yolks. Chicken or [place] with skin. Dairy Whole or 2% milk, cream, and half-and-half. Whole or full-fat cream cheese. Whole-fat or sweetened yogurt. Full-fat cheese. Nondairy creamers. Whipped toppings. Processed cheese and cheese spreads. Fats and oils Butter. Stick margarine. Lard. Shortening. Ghee. Bacon fat. Tropical oils, such as  coconut, palm kernel, or palm oil. Seasonings and condiments Onion salt, garlic salt, seasoned salt, table salt, and sea salt. Worcestershire sauce. Tartar sauce. Barbecue sauce. Teriyaki sauce. Soy sauce, including reduced-sodium. Steak sauce. Canned and packaged gravies. Fish sauce. Oyster sauce. Cocktail sauce. Store-bought horseradish. Ketchup. Mustard. Meat flavorings and tenderizers. Bouillon cubes. Hot sauces. Pre-made or packaged marinades. Pre-made or packaged taco seasonings. Relishes. Regular salad dressings. Other foods Salted popcorn and pretzels. The items listed above may not be a complete list of foods and beverages you should avoid. Contact a dietitian for more information. Where to find more information National Heart, Lung, and Blood Institute: PopSteam.is American Heart Association: www.heart.org Academy of Nutrition and Dietetics: www.eatright.org National Kidney Foundation: www.kidney.org Summary The DASH eating plan is a healthy eating plan that has been shown to reduce high blood pressure (hypertension). It may also reduce your risk for type 2 diabetes, heart disease, and stroke. When on the DASH eating plan, aim to eat more fresh fruits and vegetables, whole grains, lean proteins, low-fat dairy, and heart-healthy fats. With the DASH eating plan, you should limit salt (sodium) intake to 2,300 mg a day. If you have hypertension, you may need to reduce your sodium intake to 1,500 mg a day. Work with your health care provider or dietitian to adjust your eating plan to your individual calorie needs. This information is not intended to replace advice given to you by your health care provider. Make sure you discuss any questions you have with your health care provider. Document Revised: 01/16/2019 Document Reviewed: 01/16/2019 Elsevier Patient Education  2023 Elsevier Inc.    If you have been instructed to have an in-person evaluation today at a local Urgent Care  facility, please use the link below. It will take you to a list of all of our available [facility], including address, phone number and hours of operation. Please do not delay care.  [facility]  If you or a family member do not have a primary care provider, use the link below to schedule a visit and establish care. When you choose a [facility] primary care physician or advanced practice provider, you gain a long-term partner in health. Find a Primary Care Provider  Learn more about [facility]'s in-office and virtual care options: [facility] - Get Care Now

## 2022-07-16 NOTE — Progress Notes (Signed)
Virtual Visit Consent   Alexander Simon, you are scheduled for a virtual visit with a [facility] provider today. Just as with appointments in the office, your consent must be obtained to participate. Your consent will be active for this visit and any virtual visit you may have with one of our providers in the next 365 days. If you have a MyChart account, a copy of this consent can be sent to you electronically.  As this is a virtual visit, video technology does not allow for your provider to perform a traditional examination. This may limit your provider's ability to fully assess your condition. If your provider identifies any concerns that need to be evaluated in person or the need to arrange testing (such as labs, EKG, etc.), we will make arrangements to do so. Although advances in technology are sophisticated, we cannot ensure that it will always work on either your end or our end. If the connection with a video visit is poor, the visit may have to be switched to a telephone visit. With either a video or telephone visit, we are not always able to ensure that we have a secure connection.  By engaging in this virtual visit, you consent to the provision of healthcare and authorize for your insurance to be billed (if applicable) for the services provided during this visit. Depending on your insurance coverage, you may receive a charge related to this service.  I need to obtain your verbal consent now. Are you willing to proceed with your visit today? Alexander Simon has provided verbal consent on 07/16/2022 for a virtual visit (video or telephone). Margaretann Loveless, PA-C  Date: 07/16/2022 4:52 PM  Virtual Visit via Video Note   I, Margaretann Loveless, connected with  Alexander Simon  ([id number], 04/29/1970) on 07/16/22 at  4:45 PM EDT by a video-enabled telemedicine application and verified that I am speaking with the correct person using two identifiers.  Location: Patient: Virtual Visit Location  Patient: Home Provider: Virtual Visit Location Provider: Home Office   I discussed the limitations of evaluation and management by telemedicine and the availability of in person appointments. The patient expressed understanding and agreed to proceed.    History of Present Illness: Alexander Simon is a 52 y.o. who identifies as a male who was assigned male at birth, and is being seen today for refill on Lisinopril. He has been provided 2 months of refills by the Virtual Urgent Care team previously, once on 04/15/22, and again on 06/16/22.    Problems:  Patient Active Problem List   Diagnosis Date Noted   Mass of right side of neck    Skin tags, multiple acquired    Hypertension 02/20/2018   Chest pain 03/09/2017    Allergies: No Known Allergies Medications:  Current Outpatient Medications:    lisinopril (ZESTRIL) 30 MG tablet, Take 1 tablet (30 mg total) by mouth daily., Disp: 30 tablet, Rfl: 0   sildenafil (VIAGRA) 100 MG tablet, Take 1 tablet (100 mg total) by mouth daily as needed for erectile dysfunction., Disp: 90 tablet, Rfl: 0  Observations/Objective: Patient is well-developed, well-nourished in no acute distress.  Resting comfortably at home.  Head is normocephalic, atraumatic.  No labored breathing.  Speech is clear and coherent with logical content.  Patient is alert and oriented at baseline.    Assessment and Plan: 1. Primary hypertension - lisinopril (ZESTRIL) 30 MG tablet; Take 1 tablet (30 mg total) by mouth daily.  Dispense: 30  tablet; Refill: 0  - Lisinopril refilled one last time for 30 days. This equals a 90 day refill period allowed by Virtual Urgent Care department policies - Patient urged to establish care with a Primary Care provider, link in AVS discussed and MyChart Message sent.  - If not able to establish before next refill, patient aware he will still require in person evaluation and should be seen at a local Urgent Care for next visit if  needed  Follow Up Instructions: I discussed the assessment and treatment plan with the patient. The patient was provided an opportunity to ask questions and all were answered. The patient agreed with the plan and demonstrated an understanding of the instructions.  A copy of instructions were sent to the patient via MyChart unless otherwise noted below.    The patient was advised to call back or seek an in-person evaluation if the symptoms worsen or if the condition fails to improve as anticipated.  Time:  I spent 8 minutes with the patient via telehealth technology discussing the above problems/concerns.    Margaretann Loveless, PA-C

## 2022-07-26 ENCOUNTER — Telehealth: Payer: Commercial Managed Care - PPO

## 2022-07-26 ENCOUNTER — Ambulatory Visit
Admission: EM | Admit: 2022-07-26 | Discharge: 2022-07-26 | Disposition: A | Discharge status: Home / Self Care | Payer: Commercial Managed Care - PPO | Attending: Emergency Medicine | Admitting: Emergency Medicine

## 2022-07-26 DIAGNOSIS — N528 Other male erectile dysfunction: Secondary | ICD-10-CM

## 2022-07-26 DIAGNOSIS — Z760 Encounter for issue of repeat prescription: Secondary | ICD-10-CM | POA: Diagnosis not present

## 2022-07-26 DIAGNOSIS — N529 Male erectile dysfunction, unspecified: Secondary | ICD-10-CM

## 2022-07-26 MED ORDER — SILDENAFIL CITRATE 100 MG PO TABS: 100.0000 mg | ORAL_TABLET | Freq: Every day | ORAL | 0 refills | Status: DC | PRN

## 2022-07-26 NOTE — ED Triage Notes (Signed)
Pt presents to UC req med refill on his sildenafil. Ran out 1 wk ago.

## 2022-07-26 NOTE — Discharge Instructions (Addendum)
July 10 at 3 pm-earliest availability at the clinic upstairs, you may reach out to this office, information is on front page to see if appointment can be moved if needed   You have been given 30-day supply of medication  Is very important that you establish with a primary care so that you may receive long-term prescriptions

## 2022-07-26 NOTE — ED Provider Notes (Signed)
[facility]    CSN: [id number] Arrival date & time: 07/26/22  1802      History   Chief Complaint Chief Complaint  Patient presents with   Medication Refill    HPI Alexander Simon is a 52 y.o. male.   Patient presents for evaluation requesting medication refill for Viagra.  Difficulty obtaining PCP appointment due to work schedule.  Past Medical History:  Diagnosis Date   GERD (gastroesophageal reflux disease)    no meds   Hypertension    No pertinent past medical history     Patient Active Problem List   Diagnosis Date Noted   Mass of right side of neck    Skin tags, multiple acquired    Hypertension 02/20/2018   Chest pain 03/09/2017    Past Surgical History:  Procedure Laterality Date   CIRCUMCISION     EXCISION MASS NECK Right 06/12/2019   Procedure: EXCISION Superficial MASS NECK;  Surgeon: Duanne Guess, MD;  Location: [facility] ORS;  Service: General;  Laterality: Right;   EXCISION OF SKIN TAG Bilateral 06/12/2019   Procedure: EXCISION OF SKIN TAG on neck;  Surgeon: Duanne Guess, MD;  Location: [facility] ORS;  Service: General;  Laterality: Bilateral;   SHOULDER SURGERY Right 2014       Home Medications    Prior to Admission medications   Medication Sig Start Date End Date Taking? Authorizing Provider  lisinopril (ZESTRIL) 30 MG tablet Take 1 tablet (30 mg total) by mouth daily. 07/16/22  Yes Margaretann Loveless, PA-C  sildenafil (VIAGRA) 100 MG tablet Take 1 tablet (100 mg total) by mouth daily as needed for erectile dysfunction. 07/26/22   Valinda Hoar, NP    Family History Family History  Problem Relation Age of Onset   Breast cancer Mother    Lung cancer Mother    Cancer Maternal Uncle     Social History Social History   Tobacco Use   Smoking status: Never   Smokeless tobacco: Never  Vaping Use   Vaping Use: Never used  Substance Use Topics   Alcohol use: No    Alcohol/week: 0.0 standard drinks of alcohol   Drug use:  Never     Allergies   Patient has no known allergies.   Review of Systems Review of Systems   Physical Exam Triage Vital Signs ED Triage Vitals  Enc Vitals Group     BP 07/26/22 1806 (!) 174/107     Pulse Rate 07/26/22 1806 86     Resp 07/26/22 1806 16     Temp 07/26/22 1806 98.8 F (37.1 C)     Temp Source 07/26/22 1806 Oral     SpO2 07/26/22 1806 96 %     Weight 07/26/22 1805 279 lb (126.6 kg)     Height 07/26/22 1805 6' (1.829 m)     Head Circumference --      Peak Flow --      Pain Score 07/26/22 1810 0     Pain Loc --      Pain Edu? --      Excl. in GC? --    No data found.  Updated Vital Signs BP (!) 174/107 (BP Location: Left Arm)   Pulse 86   Temp 98.8 F (37.1 C) (Oral)   Resp 16   Ht 6' (1.829 m)   Wt 279 lb (126.6 kg)   SpO2 96%   BMI 37.84 kg/m   Visual Acuity Right Eye Distance:   Left  Eye Distance:   Bilateral Distance:    Right Eye Near:   Left Eye Near:    Bilateral Near:     Physical Exam Constitutional:      Appearance: Normal appearance.  Eyes:     Extraocular Movements: Extraocular movements intact.  Pulmonary:     Effort: Pulmonary effort is normal.  Neurological:     Mental Status: He is alert and oriented to person, place, and time. Mental status is at baseline.      UC Treatments / Results  Labs (all labs ordered are listed, but only abnormal results are displayed) Labs Reviewed - No data to display  EKG   Radiology No results found.  Procedures Procedures (including critical care time)  Medications Ordered in UC Medications - No data to display  Initial Impression / Assessment and Plan / UC Course  I have reviewed the triage vital signs and the nursing notes.  Pertinent labs & imaging results that were available during my care of the patient were reviewed by me and considered in my medical decision making (see chart for details).  Male erectile dysfunction, encounter for medication refill  60-day  supply refill, set patient up with PCP appointment, unfortunately at 3 PM, advised to keep appointment to reach out to office to see if there is any further availability during the better timeframe or if he can get onto a cancellation list so that he can be seen, blood pressure elevated in triage at 174/107, strongly advised PCP appointment for monitoring and medication adjustments, advised to monitor blood pressure at home, may follow-up with his urgent care as needed Final Clinical Impressions(s) / UC Diagnoses   Final diagnoses:  Encounter for medication refill  Other male erectile dysfunction     Discharge Instructions      July 10 at 3 pm-earliest availability at the clinic upstairs, you may reach out to this office, information is on front page to see if appointment can be moved if needed   You have been given 30-day supply of medication  Is very important that you establish with a primary care so that you may receive long-term prescriptions   ED Prescriptions     Medication Sig Dispense Auth. Provider   sildenafil (VIAGRA) 100 MG tablet  (Status: Discontinued) Take 1 tablet (100 mg total) by mouth daily as needed for erectile dysfunction. 60 tablet Zayah Keilman R, NP   sildenafil (VIAGRA) 100 MG tablet Take 1 tablet (100 mg total) by mouth daily as needed for erectile dysfunction. 60 tablet Tritia Endo, Elita Boone, NP      PDMP not reviewed this encounter.   Valinda Hoar, NP 07/26/22 1859

## 2022-09-05 ENCOUNTER — Ambulatory Visit: Payer: Self-pay | Admitting: Physician Assistant

## 2022-11-12 ENCOUNTER — Emergency Department: Payer: Commercial Managed Care - PPO

## 2022-11-12 ENCOUNTER — Ambulatory Visit (INDEPENDENT_AMBULATORY_CARE_PROVIDER_SITE_OTHER)
Admission: EM | Admit: 2022-11-12 | Discharge: 2022-11-12 | Disposition: A | Discharge status: Other Acute Inpatient Hospital | Payer: Commercial Managed Care - PPO | Source: Home / Self Care | Attending: Family Medicine | Admitting: Family Medicine

## 2022-11-12 ENCOUNTER — Other Ambulatory Visit: Payer: Self-pay

## 2022-11-12 ENCOUNTER — Encounter: Payer: Self-pay | Admitting: Emergency Medicine

## 2022-11-12 ENCOUNTER — Inpatient Hospital Stay
Admission: EM | Admit: 2022-11-12 | Discharge: 2022-11-14 | DRG: 282 | Disposition: A | Discharge status: Other Acute Inpatient Hospital | Payer: Commercial Managed Care - PPO | Attending: Internal Medicine | Admitting: Internal Medicine

## 2022-11-12 DIAGNOSIS — I214 Non-ST elevation (NSTEMI) myocardial infarction: Secondary | ICD-10-CM

## 2022-11-12 DIAGNOSIS — R7303 Prediabetes: Secondary | ICD-10-CM | POA: Diagnosis present

## 2022-11-12 DIAGNOSIS — Z8249 Family history of ischemic heart disease and other diseases of the circulatory system: Secondary | ICD-10-CM | POA: Diagnosis not present

## 2022-11-12 DIAGNOSIS — I259 Chronic ischemic heart disease, unspecified: Secondary | ICD-10-CM | POA: Diagnosis not present

## 2022-11-12 DIAGNOSIS — I5021 Acute systolic (congestive) heart failure: Secondary | ICD-10-CM | POA: Diagnosis not present

## 2022-11-12 DIAGNOSIS — I2510 Atherosclerotic heart disease of native coronary artery without angina pectoris: Secondary | ICD-10-CM | POA: Diagnosis present

## 2022-11-12 DIAGNOSIS — N529 Male erectile dysfunction, unspecified: Secondary | ICD-10-CM | POA: Diagnosis present

## 2022-11-12 DIAGNOSIS — R079 Chest pain, unspecified: Secondary | ICD-10-CM

## 2022-11-12 DIAGNOSIS — Z79899 Other long term (current) drug therapy: Secondary | ICD-10-CM | POA: Diagnosis not present

## 2022-11-12 DIAGNOSIS — Z6839 Body mass index (BMI) 39.0-39.9, adult: Secondary | ICD-10-CM

## 2022-11-12 DIAGNOSIS — I10 Essential (primary) hypertension: Secondary | ICD-10-CM | POA: Diagnosis present

## 2022-11-12 DIAGNOSIS — K219 Gastro-esophageal reflux disease without esophagitis: Secondary | ICD-10-CM | POA: Diagnosis present

## 2022-11-12 DIAGNOSIS — E669 Obesity, unspecified: Secondary | ICD-10-CM | POA: Diagnosis present

## 2022-11-12 DIAGNOSIS — Z7982 Long term (current) use of aspirin: Secondary | ICD-10-CM

## 2022-11-12 DIAGNOSIS — I493 Ventricular premature depolarization: Secondary | ICD-10-CM | POA: Diagnosis present

## 2022-11-12 DIAGNOSIS — D649 Anemia, unspecified: Secondary | ICD-10-CM | POA: Diagnosis present

## 2022-11-12 DIAGNOSIS — I200 Unstable angina: Secondary | ICD-10-CM | POA: Diagnosis not present

## 2022-11-12 DIAGNOSIS — I160 Hypertensive urgency: Secondary | ICD-10-CM | POA: Diagnosis present

## 2022-11-12 DIAGNOSIS — E785 Hyperlipidemia, unspecified: Secondary | ICD-10-CM | POA: Diagnosis present

## 2022-11-12 DIAGNOSIS — Z801 Family history of malignant neoplasm of trachea, bronchus and lung: Secondary | ICD-10-CM | POA: Diagnosis not present

## 2022-11-12 DIAGNOSIS — I252 Old myocardial infarction: Secondary | ICD-10-CM | POA: Diagnosis present

## 2022-11-12 DIAGNOSIS — E66812 Obesity, class 2: Secondary | ICD-10-CM | POA: Diagnosis present

## 2022-11-12 DIAGNOSIS — Z803 Family history of malignant neoplasm of breast: Secondary | ICD-10-CM

## 2022-11-12 LAB — CBC WITH DIFFERENTIAL/PLATELET
Abs Immature Granulocytes: 0.02 10*3/uL (ref 0.00–0.07)
Basophils Absolute: 0.1 10*3/uL (ref 0.0–0.1)
Basophils Relative: 1 %
Eosinophils Absolute: 0.1 10*3/uL (ref 0.0–0.5)
Eosinophils Relative: 2 %
HCT: 40.9 % (ref 39.0–52.0)
Hemoglobin: 12.9 g/dL — ABNORMAL LOW (ref 13.0–17.0)
Immature Granulocytes: 0 %
Lymphocytes Relative: 37 %
Lymphs Abs: 2.6 10*3/uL (ref 0.7–4.0)
MCH: 25.9 pg — ABNORMAL LOW (ref 26.0–34.0)
MCHC: 31.5 g/dL (ref 30.0–36.0)
MCV: 82 fL (ref 80.0–100.0)
Monocytes Absolute: 0.6 10*3/uL (ref 0.1–1.0)
Monocytes Relative: 8 %
Neutro Abs: 3.8 10*3/uL (ref 1.7–7.7)
Neutrophils Relative %: 52 %
Platelets: 220 10*3/uL (ref 150–400)
RBC: 4.99 MIL/uL (ref 4.22–5.81)
RDW: 14 % (ref 11.5–15.5)
WBC: 7.2 10*3/uL (ref 4.0–10.5)
nRBC: 0 % (ref 0.0–0.2)

## 2022-11-12 LAB — PROTIME-INR
INR: 1 (ref 0.8–1.2)
Prothrombin Time: 13.4 s (ref 11.4–15.2)

## 2022-11-12 LAB — APTT: aPTT: 28 s (ref 24–36)

## 2022-11-12 LAB — BASIC METABOLIC PANEL
Anion gap: 7 (ref 5–15)
BUN: 15 mg/dL (ref 6–20)
CO2: 27 mmol/L (ref 22–32)
Calcium: 9.3 mg/dL (ref 8.9–10.3)
Chloride: 103 mmol/L (ref 98–111)
Creatinine, Ser: 1.15 mg/dL (ref 0.61–1.24)
GFR, Estimated: >60 mL/min (ref >60)
Glucose, Bld: 91 mg/dL (ref 70–99)
Potassium: 4.4 mmol/L (ref 3.5–5.1)
Sodium: 137 mmol/L (ref 135–145)

## 2022-11-12 LAB — TROPONIN I (HIGH SENSITIVITY)
Troponin I (High Sensitivity): 762 ng/L (ref <18)
Troponin I (High Sensitivity): 593 ng/L (ref <18)

## 2022-11-12 LAB — HEPATIC FUNCTION PANEL
ALT: 34 U/L (ref 0–44)
AST: 23 U/L (ref 15–41)
Albumin: 4 g/dL (ref 3.5–5.0)
Alkaline Phosphatase: 50 U/L (ref 38–126)
Bilirubin, Direct: 0.1 mg/dL (ref 0.0–0.2)
Indirect Bilirubin: 0.8 mg/dL (ref 0.3–0.9)
Total Bilirubin: 0.9 mg/dL (ref 0.3–1.2)
Total Protein: 7.9 g/dL (ref 6.5–8.1)

## 2022-11-12 LAB — T4, FREE: Free T4: 0.81 ng/dL (ref 0.61–1.12)

## 2022-11-12 LAB — TSH: TSH: 1.991 u[IU]/mL (ref 0.350–4.500)

## 2022-11-12 LAB — D-DIMER, QUANTITATIVE: D-Dimer, Quant: 0.28 ug{FEU}/mL (ref 0.00–0.50)

## 2022-11-12 MED ORDER — SODIUM CHLORIDE 0.9% FLUSH: 3.0000 mL | INTRAVENOUS | Status: DC | PRN

## 2022-11-12 MED ORDER — ACETAMINOPHEN 325 MG PO TABS
650.0000 mg | ORAL_TABLET | ORAL | Status: DC | PRN
  Administered 2022-11-13: 650 mg via ORAL
  Filled 2022-11-12 (×2): qty 2

## 2022-11-12 MED ORDER — LISINOPRIL 20 MG PO TABS
30.0000 mg | ORAL_TABLET | Freq: Every day | ORAL | Status: DC
  Administered 2022-11-13 – 2022-11-14 (×2): 30 mg via ORAL
  Filled 2022-11-12: qty 1
  Filled 2022-11-12: qty 3

## 2022-11-12 MED ORDER — ASPIRIN 81 MG PO CHEW
324.0000 mg | CHEWABLE_TABLET | Freq: Once | ORAL | Status: AC
  Administered 2022-11-12: 324 mg via ORAL

## 2022-11-12 MED ORDER — METOPROLOL TARTRATE 25 MG PO TABS
12.5000 mg | ORAL_TABLET | Freq: Two times a day (BID) | ORAL | Status: DC
  Administered 2022-11-12 – 2022-11-14 (×4): 12.5 mg via ORAL
  Filled 2022-11-12 (×4): qty 1

## 2022-11-12 MED ORDER — HEPARIN (PORCINE) 25000 UT/250ML-% IV SOLN
1700.0000 [IU]/h | INTRAVENOUS | Status: DC
  Administered 2022-11-12: 1500 [IU]/h via INTRAVENOUS
  Administered 2022-11-13: 1700 [IU]/h via INTRAVENOUS
  Filled 2022-11-12 (×2): qty 250

## 2022-11-12 MED ORDER — SODIUM CHLORIDE 0.9 % IV SOLN: 250.0000 mL | INTRAVENOUS | Status: DC | PRN

## 2022-11-12 MED ORDER — ATORVASTATIN CALCIUM 20 MG PO TABS: 40.0000 mg | ORAL_TABLET | Freq: Every day | ORAL | Status: DC

## 2022-11-12 MED ORDER — HEPARIN BOLUS VIA INFUSION
4000.0000 [IU] | Freq: Once | INTRAVENOUS | Status: AC
  Administered 2022-11-12: 4000 [IU] via INTRAVENOUS
  Filled 2022-11-12: qty 4000

## 2022-11-12 MED ORDER — NITROGLYCERIN 0.4 MG SL SUBL: 0.4000 mg | SUBLINGUAL_TABLET | SUBLINGUAL | Status: DC | PRN

## 2022-11-12 MED ORDER — ASPIRIN 81 MG PO TBEC
81.0000 mg | DELAYED_RELEASE_TABLET | Freq: Every day | ORAL | Status: DC
  Administered 2022-11-13 – 2022-11-14 (×2): 81 mg via ORAL
  Filled 2022-11-12 (×2): qty 1

## 2022-11-12 MED ORDER — SODIUM CHLORIDE 0.9% FLUSH
3.0000 mL | Freq: Two times a day (BID) | INTRAVENOUS | Status: DC
  Administered 2022-11-12 – 2022-11-14 (×4): 3 mL via INTRAVENOUS

## 2022-11-12 MED ORDER — ONDANSETRON HCL 4 MG/2ML IJ SOLN: 4.0000 mg | Freq: Four times a day (QID) | INTRAMUSCULAR | Status: DC | PRN

## 2022-11-12 NOTE — ED Triage Notes (Signed)
Pt to ED via EMS from UC, pt states this past Friday he had an episode where he was having chest pain and right arm numbness, pt said episode resolved and he was fine all weekend pt went to UC today just to be checked out. Pt had troponin of 762 at UC. Pt was given 324 asa by UC. Pt denies pain at this time.

## 2022-11-12 NOTE — Assessment & Plan Note (Addendum)
Continue with asa 81 mg/ heparin gtt, lipitor / metoprolol and lisinopril.  Cont with BB and statin. Cardiology consulted- [facility].

## 2022-11-12 NOTE — ED Triage Notes (Signed)
[facility] REPORT: Pt with left sided chest that radiated into his left arm since Friday 9/13. Per UC pt refused to seek medical care until talked into it today. Pt refused all workup at [facility] until provider able to talk him into Troponin which resulted in 762. Pt still continuing with EMS not wanting IV or other treatment but cooperative once explained.

## 2022-11-12 NOTE — ED Notes (Signed)
Patient is being discharged from the Urgent Care and sent to the Emergency Department via EMS . Per Dr. Rachael Darby, patient is in need of higher level of care due to Chest Pain, elevated troponin. Patient is aware and verbalizes understanding of plan of care.  Vitals:   11/12/22 1637  BP: 130/74  Pulse: 79  Resp: 18  Temp: 98.4 F (36.9 C)  SpO2: 100%

## 2022-11-12 NOTE — Assessment & Plan Note (Addendum)
2/2 to NSTEMI.

## 2022-11-12 NOTE — ED Notes (Signed)
Pt eating at this time.

## 2022-11-12 NOTE — Assessment & Plan Note (Addendum)
A1c

## 2022-11-12 NOTE — Assessment & Plan Note (Addendum)
A1c today.

## 2022-11-12 NOTE — H&P (Signed)
History and Physical    Patient: Alexander Simon ZOX:[id number] DOB: 06/27/1970 DOA: 11/12/2022 DOS: the patient was seen and examined on 11/12/2022 PCP: Pcp, No  Patient coming from: Home Chief Complaint:  Chief Complaint  Patient presents with   Chest Pain    HPI: Alexander Simon is a 52 y.o. male with medical history significant for Htn, Obesity, prediabetic, coming today for chest pain and  abnormal troponin found at urgent care. Pt states chest pain has been going on since  Friday off and on, pressure like. States he has indigestion and nausea like when he eats [place] food. D/W pt that I reviewed his chart and in 2019 he had chest pain but said the checking him and it was not his heart. He does recall eating a high salty diet on Friday evening when this started. D/w about his prediabetes and he says he is not claiming diabetes but he may be prediabetic. Pt is in no distress or chest pain.  D/w pt if he has long distance driving history he said yes he commutes from [place] to [facility] to [place] and the drive is about 3 hours. CTA testing discussed and will wait for dimer. In Ed pt is Alert/oriented and family at bedside.  Review of Systems: Review of Systems  Respiratory:  Negative for shortness of breath.   Cardiovascular:  Positive for chest pain and leg swelling.  All other systems reviewed and are negative.  Past Medical History:  Diagnosis Date   GERD (gastroesophageal reflux disease)    no meds   Hypertension    No pertinent past medical history    Past Surgical History:  Procedure Laterality Date   CIRCUMCISION     EXCISION MASS NECK Right 06/12/2019   Procedure: EXCISION Superficial MASS NECK;  Surgeon: Duanne Guess, MD;  Location: [facility] ORS;  Service: General;  Laterality: Right;   EXCISION OF SKIN TAG Bilateral 06/12/2019   Procedure: EXCISION OF SKIN TAG on neck;  Surgeon: Duanne Guess, MD;  Location: [facility] ORS;  Service: General;  Laterality: Bilateral;   SHOULDER  SURGERY Right 2014   Social History:   reports that he has never smoked. He has never used smokeless tobacco. He reports that he does not drink alcohol and does not use drugs. No Known Allergies Family History  Problem Relation Age of Onset   Breast cancer Mother    Lung cancer Mother    Cancer Maternal Uncle    Prior to Admission medications   Medication Sig Start Date End Date Taking? Authorizing Provider  lisinopril (ZESTRIL) 30 MG tablet Take 1 tablet (30 mg total) by mouth daily. 07/16/22  Yes Margaretann Loveless, PA-C  sildenafil (VIAGRA) 100 MG tablet Take 1 tablet (100 mg total) by mouth daily as needed for erectile dysfunction. 07/26/22  Yes White, Hansel Starling R, NP   Vitals:   11/12/22 2100 11/12/22 2130 11/12/22 2227 11/12/22 2230  BP: 138/84 (!) 155/79 (!) 161/78 (!) 156/72  Pulse: 78 83 85 78  Resp: 13 17  19   Temp:      TempSrc:      SpO2: 97% 100%  100%  Weight:      Height:       Physical Exam Vitals and nursing note reviewed.  Constitutional:      General: He is not in acute distress. HENT:     Head: Normocephalic and atraumatic.     Right Ear: Hearing normal.     Left Ear: Hearing normal.  Nose: No nasal deformity.     Mouth/Throat:     Lips: Pink.     Tongue: No lesions.  Eyes:     General: Lids are normal.     Extraocular Movements: Extraocular movements intact.  Cardiovascular:     Rate and Rhythm: Normal rate and regular rhythm.     Heart sounds: Normal heart sounds.  Pulmonary:     Effort: Pulmonary effort is normal.     Breath sounds: Normal breath sounds.  Chest:    Abdominal:     General: Abdomen is protuberant. Bowel sounds are normal. There is no distension.     Palpations: Abdomen is soft. There is no mass.     Tenderness: There is no abdominal tenderness.  Musculoskeletal:     Right lower leg: Edema present.     Left lower leg: Edema present.  Skin:    General: Skin is warm.  Neurological:     General: No focal deficit present.      Mental Status: He is alert and oriented to person, place, and time.     Cranial Nerves: Cranial nerves 2-12 are intact.  Psychiatric:        Attention and Perception: Attention normal.        Mood and Affect: Mood normal.        Speech: Speech normal.        Behavior: Behavior normal. Behavior is cooperative.   Labs on Admission: I have personally reviewed following labs and imaging studies CBC: Recent Labs  Lab 11/12/22 1809  WBC 7.2  NEUTROABS 3.8  HGB 12.9*  HCT 40.9  MCV 82.0  PLT 220   Basic Metabolic Panel: Recent Labs  Lab 11/12/22 1809  NA 137  K 4.4  CL 103  CO2 27  GLUCOSE 91  BUN 15  CREATININE 1.15  CALCIUM 9.3   GFR: Estimated Creatinine Clearance: 105.2 mL/min (by C-G formula based on SCr of 1.15 mg/dL). Liver Function Tests: Recent Labs  Lab 11/12/22 2000  AST 23  ALT 34  ALKPHOS 50  BILITOT 0.9  PROT 7.9  ALBUMIN 4.0   No results for input(s): "LIPASE", "AMYLASE" in the last 168 hours. No results for input(s): "AMMONIA" in the last 168 hours. Coagulation Profile: Recent Labs  Lab 11/12/22 2000  INR 1.0   Cardiac Enzymes: No results for input(s): "CKTOTAL", "CKMB", "CKMBINDEX", "TROPONINI" in the last 168 hours. BNP (last 3 results) No results for input(s): "PROBNP" in the last 8760 hours. HbA1C: No results for input(s): "HGBA1C" in the last 72 hours. CBG: No results for input(s): "GLUCAP" in the last 168 hours. Lipid Profile: No results for input(s): "CHOL", "HDL", "LDLCALC", "TRIG", "CHOLHDL", "LDLDIRECT" in the last 72 hours. Thyroid Function Tests: Recent Labs    11/12/22 2122  TSH 1.991  FREET4 0.81   Anemia Panel: No results for input(s): "VITAMINB12", "FOLATE", "FERRITIN", "TIBC", "IRON", "RETICCTPCT" in the last 72 hours. Urinalysis    Component Value Date/Time   APPEARANCEUR Clear 05/02/2018 0830   GLUCOSEU CANCELED 05/04/2019 0000   BILIRUBINUR Negative 05/02/2018 0830   PROTEINUR CANCELED 05/04/2019 0000    NITRITE Negative 05/02/2018 0830   LEUKOCYTESUR Negative 05/02/2018 0830   Unresulted Labs (From admission, onward)     Start     Ordered   11/13/22 0500  Heparin level (unfractionated)  Tomorrow morning,   R        11/12/22 2005   11/13/22 0500  CBC  Tomorrow morning,   R  11/12/22 2005   11/13/22 0500  Lipoprotein A (LPA)  Tomorrow morning,   R        11/12/22 2144   11/13/22 0500  Lipid panel  Tomorrow morning,   R        11/12/22 2144   11/12/22 2141  Hemoglobin A1c  Add-on,   AD        11/12/22 2144           Medications  heparin ADULT infusion 100 units/mL (25000 units/262mL) (1,500 Units/hr Intravenous New Bag/Given 11/12/22 2027)  lisinopril (ZESTRIL) tablet 30 mg (has no administration in time range)  sodium chloride flush (NS) 0.9 % injection 3 mL (3 mLs Intravenous Given 11/12/22 2219)  sodium chloride flush (NS) 0.9 % injection 3 mL (has no administration in time range)  0.9 %  sodium chloride infusion (has no administration in time range)  aspirin EC tablet 81 mg (has no administration in time range)  nitroGLYCERIN (NITROSTAT) SL tablet 0.4 mg (has no administration in time range)  atorvastatin (LIPITOR) tablet 40 mg (has no administration in time range)  acetaminophen (TYLENOL) tablet 650 mg (has no administration in time range)  ondansetron (ZOFRAN) injection 4 mg (has no administration in time range)  metoprolol tartrate (LOPRESSOR) tablet 12.5 mg (12.5 mg Oral Given 11/12/22 2227)  heparin bolus via infusion 4,000 Units (4,000 Units Intravenous Bolus from Bag 11/12/22 2028)   Radiological Exams on Admission: DG Chest Port 1 View  Result Date: 11/12/2022 CLINICAL DATA:  Chest pain on the left for 3 days, initial encounter EXAM: PORTABLE CHEST 1 VIEW COMPARISON:  None Available. FINDINGS: The heart size and mediastinal contours are within normal limits. Both lungs are clear. The visualized skeletal structures are unremarkable. IMPRESSION: No active disease.  Electronically Signed   By: Alcide Clever M.D.   On: 11/12/2022 21:31    Data Reviewed: Relevant notes from primary care and specialist visits, past discharge summaries as available in EHR, including Care Everywhere. Prior diagnostic testing as pertinent to current admission diagnoses Updated medications and problem lists for reconciliation ED course, including vitals, labs, imaging, treatment and response to treatment Triage notes, nursing and pharmacy notes and ED provider's notes Notable results as noted in HPI  Assessment & Plan Chest pain 2/2 to NSTEMI.  Hypertension Cont lisinopril. Cont metoprolol.  NSTEMI (non-ST elevated myocardial infarction) (HCC) Continue with asa 81 mg/ heparin gtt, lipitor / metoprolol and lisinopril.  Cont with BB and statin. Cardiology consulted- [facility]. Obesity (BMI 30-39.9) A1c.   Prediabetes A1c today.  DVT prophylaxis:  Heparin gtt.  Consults:  Cardiology: [facility].   Advance Care Planning:    Code Status: Full Code   Family Communication:  None   Disposition Plan:  Home   Severity of Illness: The appropriate patient status for this patient is INPATIENT. Inpatient status is judged to be reasonable and necessary in order to provide the required intensity of service to ensure the patient's safety. The patient's presenting symptoms, physical exam findings, and initial radiographic and laboratory data in the context of their chronic comorbidities is felt to place them at high risk for further clinical deterioration. Furthermore, it is not anticipated that the patient will be medically stable for discharge from the hospital within 2 midnights of admission.   * I certify that at the point of admission it is my clinical judgment that the patient will require inpatient hospital care spanning beyond 2 midnights from the point of admission due to high intensity of service,  high risk for further deterioration and high frequency of surveillance  required.*  Author: Gertha Calkin, MD 11/12/2022 11:16 PM  For on call review [email or web address].

## 2022-11-12 NOTE — ED Provider Notes (Signed)
[facility]    CSN: [id number] Arrival date & time: 11/12/22  1607      History   Chief Complaint Chief Complaint  Patient presents with   Numbness   Chest Pain    HPI Alexander Simon is a 52 y.o. male.   HPI  Alexander Simon here for substernal chest pain with left arm numbness with shortness of breath that started on Friday while leaving a restaurant.  He drove home about 35 minutes.   Took some Tums but that has not helped.vomiting helped his discomfort in his chest.  Has been intermittent since.  No pain today did have some shortness of breath at work.  He got tired of the people at work telling him that he should go get seen, so he came into the urgent care.  He did not go to the emergency department on Friday as he did not want to go to the hospital and he "does not like doctors".     Past Medical History:  Diagnosis Date   GERD (gastroesophageal reflux disease)    no meds   Hypertension    No pertinent past medical history     Patient Active Problem List   Diagnosis Date Noted   Mass of right side of neck    Skin tags, multiple acquired    Hypertension 02/20/2018   Chest pain 03/09/2017    Past Surgical History:  Procedure Laterality Date   CIRCUMCISION     EXCISION MASS NECK Right 06/12/2019   Procedure: EXCISION Superficial MASS NECK;  Surgeon: Duanne Guess, MD;  Location: [facility] ORS;  Service: General;  Laterality: Right;   EXCISION OF SKIN TAG Bilateral 06/12/2019   Procedure: EXCISION OF SKIN TAG on neck;  Surgeon: Duanne Guess, MD;  Location: [facility] ORS;  Service: General;  Laterality: Bilateral;   SHOULDER SURGERY Right 2014       Home Medications    Prior to Admission medications   Medication Sig Start Date End Date Taking? Authorizing Provider  lisinopril (ZESTRIL) 30 MG tablet Take 1 tablet (30 mg total) by mouth daily. 07/16/22  Yes Margaretann Loveless, PA-C  sildenafil (VIAGRA) 100 MG tablet Take 1 tablet (100 mg total) by mouth  daily as needed for erectile dysfunction. 07/26/22  Yes White, Elita Boone, NP    Family History Family History  Problem Relation Age of Onset   Breast cancer Mother    Lung cancer Mother    Cancer Maternal Uncle     Social History Social History   Tobacco Use   Smoking status: Never   Smokeless tobacco: Never  Vaping Use   Vaping status: Never Used  Substance Use Topics   Alcohol use: No    Alcohol/week: 0.0 standard drinks of alcohol   Drug use: Never     Allergies   Patient has no known allergies.   Review of Systems Review of Systems :negative unless otherwise stated in HPI.      Physical Exam Triage Vital Signs ED Triage Vitals [11/12/22 1637]  Encounter Vitals Group     BP 130/74     Systolic BP Percentile      Diastolic BP Percentile      Pulse Rate 79     Resp 18     Temp 98.4 F (36.9 C)     Temp Source Oral     SpO2 100 %     Weight      Height      Head Circumference  Peak Flow      Pain Score 4     Pain Loc      Pain Education      Exclude from Growth Chart    No data found.  Updated Vital Signs BP 130/74 (BP Location: Left Arm)   Pulse 79   Temp 98.4 F (36.9 C) (Oral)   Resp 18   SpO2 100%   Visual Acuity Right Eye Distance:   Left Eye Distance:   Bilateral Distance:    Right Eye Near:   Left Eye Near:    Bilateral Near:     Physical Exam  GEN: non-ill appearing male, in no acute distress  CV: regular rate and rhythm,  no murmurs, rubs or gallops  appreciated,, no JVP  RESP: no increased work of breathing, clear to ascultation bilaterally MSK: no edema SKIN: warm, dry   UC Treatments / Results  Labs (all labs ordered are listed, but only abnormal results are displayed) Labs Reviewed  CBC WITH DIFFERENTIAL/PLATELET - Abnormal; Notable for the following components:      Result Value   Hemoglobin 12.9 (*)    MCH 25.9 (*)    All other components within normal limits  TROPONIN I (HIGH SENSITIVITY) - Abnormal;  Notable for the following components:   Troponin I (High Sensitivity) 762 (*)    All other components within normal limits  BASIC METABOLIC PANEL    EKG  See my EKG interpretation in the MDM section  Radiology No results found.   Procedures Procedures (including critical care time)  Medications Ordered in UC Medications  aspirin chewable tablet 324 mg (324 mg Oral Given 11/12/22 1854)    Initial Impression / Assessment and Plan / UC Course  I have reviewed the triage vital signs and the nursing notes.  Pertinent labs & imaging results that were available during my care of the patient were reviewed by me and considered in my medical decision making (see chart for details).       Patient is a 52 y.o. male with history of hypertension who presents with substernal chest pain that started on Friday evening.  Patient with high risk chest pain.  Advised him to go to the ED for evaluation cardiac workup however he states he will NOT go.  Bargained with patient to do at least a troponin and some blood work before he leaves and he is agreeable.  I told patient if his troponin is elevated and he is having a heart attack and he will need to be evaluated in the emergency department he is agreeable to this.  States that his sister-in-law just recently had similar symptoms and had a heart attack.  He request FMLA paperwork before we begin this process however I explained to the patient that he cannot get FMLA at this time.  He does not want to lose his job at the [employer].  Differential diagnosis includes, but is not limited to, ACS, aortic dissection, pulmonary embolism, cardiac tamponade, pneumothorax, pneumonia, pericarditis/myocarditis, GI-related causes including esophagitis/gastritis, and musculoskeletal chest wall pain    EKG obtained and is without ST elevations but there is some ischemia which may not be new compared to previous EKG, NSR.  CBC remarkable for mild anemia which is not  new for him.  There were no electrolyte abnormalitis seen on BMP.  Chest x-ray declined as he states he is ready to leave. Initial hsTrop is significantly elevated at 762.  Showed patient his current and previous troponin and  explained again recommendation for ED now via EMS.  He declined but wanted to speak with his daughter. Explained to patient that he could die and he is now agreeable to go via EMS to the hospital.  EMS updated upon arrival.  Called and spoke with charge RN and updated her on pt's arrival via EMS with elevated troponin.     Discussed MDM, treatment plan and plan for follow-up with patient who agrees with plan.         Final Clinical Impressions(s) / UC Diagnoses   Final diagnoses:  NSTEMI (non-ST elevated myocardial infarction) [facility])   Discharge Instructions   None    ED Prescriptions   None    PDMP not reviewed this encounter.   Katha Cabal, DO 11/12/22 1952

## 2022-11-12 NOTE — ED Notes (Signed)
Admitting MD at bedside.

## 2022-11-12 NOTE — Progress Notes (Signed)
ANTICOAGULATION CONSULT NOTE  Pharmacy Consult for heparin infusion Indication: chest pain/ACS  No Known Allergies  Patient Measurements: Height: 6' (182.9 cm) Weight: 131.1 kg (289 lb) IBW/kg (Calculated) : 77.6 Heparin Dosing Weight: 107.2 kg  Vital Signs: Temp: 98.3 F (36.8 C) (09/16 1950) Temp Source: Oral (09/16 1950) BP: 197/104 (09/16 1946) Pulse Rate: 89 (09/16 1946)  Labs: Recent Labs    11/12/22 1809  HGB 12.9*  HCT 40.9  PLT 220  CREATININE 1.15  TROPONINIHS 762*    Estimated Creatinine Clearance: 105.2 mL/min (by C-G formula based on SCr of 1.15 mg/dL).   Medical History: Past Medical History:  Diagnosis Date   GERD (gastroesophageal reflux disease)    no meds   Hypertension    No pertinent past medical history     Assessment: 52 y.o. male w/ PMH of HTN, GERD presents with chest pain. A review of medical records reveals no chronic anticoagulation prior to arrival.  Goal of Therapy:  Heparin level 0.3-0.7 units/ml Monitor platelets by anticoagulation protocol: Yes   Plan:  Give 4000 units bolus x 1 Start heparin infusion at 1500 units/hr Check anti-Xa level in 6 hours and daily while on heparin Continue to monitor H&H and platelets  Lowella Bandy 11/12/2022,8:00 PM

## 2022-11-12 NOTE — ED Provider Notes (Signed)
[facility] Provider Note    Event Date/Time   First MD Initiated Contact with Patient 11/12/22 1942     (approximate)   History   Chest Pain   HPI  Alexander Simon is a 52 year old male with history of hypertension, GERD presenting to the emergency department for evaluation of chest pain.  On Friday, patient was eating [place] food when he had onset of chest pain.  Initially thought it was related to his GERD, took Tums without improvement.  Did not have some tingling in his right arm when this happened.  Reports nausea without vomiting.  On Saturday, his symptoms had completely resolved and he remained without symptoms until today, but after discussion with friends decided to be checked out at urgent care.  There, he initially declined ER transport, and had workup performed in the urgent care that was notable for an elevated high-sensitivity troponin of 762, previously normal in May 2022.  Labs otherwise notable for mild anemia with hemoglobin of 12.9.    Physical Exam   Triage Vital Signs: ED Triage Vitals  Encounter Vitals Group     BP 11/12/22 1946 (!) 197/104     Systolic BP Percentile --      Diastolic BP Percentile --      Pulse Rate 11/12/22 1946 89     Resp 11/12/22 1946 18     Temp 11/12/22 1950 98.3 F (36.8 C)     Temp Source 11/12/22 1946 Oral     SpO2 11/12/22 1946 100 %     Weight 11/12/22 1949 289 lb (131.1 kg)     Height 11/12/22 1949 6' (1.829 m)     Head Circumference --      Peak Flow --      Pain Score 11/12/22 1944 0     Pain Loc --      Pain Education --      Exclude from Growth Chart --     Most recent vital signs: Vitals:   11/12/22 2227 11/12/22 2230  BP: (!) 161/78 (!) 156/72  Pulse: 85 78  Resp:  19  Temp:    SpO2:  100%     General: Awake, interactive  CV:  Regular rate, good peripheral perfusion.  Resp:  Lungs clear, unlabored respirations.  Abd:  Soft, nondistended.  Neuro:  Symmetric facial movement,  fluid speech, moving all extremity spontaneously and equally   ED Results / Procedures / Treatments   Labs (all labs ordered are listed, but only abnormal results are displayed) Labs Reviewed  TROPONIN I (HIGH SENSITIVITY) - Abnormal; Notable for the following components:      Result Value   Troponin I (High Sensitivity) 593 (*)    All other components within normal limits  APTT  PROTIME-INR  HEPATIC FUNCTION PANEL  D-DIMER, QUANTITATIVE  T4, FREE  TSH  HEPARIN LEVEL (UNFRACTIONATED)  CBC  LIPOPROTEIN A (LPA)  HEMOGLOBIN A1C  LIPID PANEL     EKG EKG independently reviewed interpreted by myself (ER attending) demonstrates:  EKG demonstrates sinus rhythm at a rate of 82, PR 104, QRS 96, QTc 433, T wave inversions with subtle ST depression in multiple leads concerning for strain pattern, subtle elevation in the anterior leads, but is very similar morphology to prior from 06/29/2020  RADIOLOGY Imaging independently reviewed and interpreted by myself demonstrates:  CXR without focal consolidation  PROCEDURES:  Critical Care performed: Yes, see critical care procedure note(s)  CRITICAL CARE Performed by: Trinna Post  Total critical care time: 32 minutes  Critical care time was exclusive of separately billable procedures and treating other patients.  Critical care was necessary to treat or prevent imminent or life-threatening deterioration.  Critical care was time spent personally by me on the following activities: development of treatment plan with patient and/or surrogate as well as nursing, discussions with consultants, evaluation of patient's response to treatment, examination of patient, obtaining history from patient or surrogate, ordering and performing treatments and interventions, ordering and review of laboratory studies, ordering and review of radiographic studies, pulse oximetry and re-evaluation of patient's condition.   Procedures   MEDICATIONS ORDERED IN  ED: Medications  heparin ADULT infusion 100 units/mL (25000 units/232mL) (1,500 Units/hr Intravenous New Bag/Given 11/12/22 2027)  lisinopril (ZESTRIL) tablet 30 mg (has no administration in time range)  sodium chloride flush (NS) 0.9 % injection 3 mL (3 mLs Intravenous Given 11/12/22 2219)  sodium chloride flush (NS) 0.9 % injection 3 mL (has no administration in time range)  0.9 %  sodium chloride infusion (has no administration in time range)  aspirin EC tablet 81 mg (has no administration in time range)  nitroGLYCERIN (NITROSTAT) SL tablet 0.4 mg (has no administration in time range)  atorvastatin (LIPITOR) tablet 40 mg (has no administration in time range)  acetaminophen (TYLENOL) tablet 650 mg (has no administration in time range)  ondansetron (ZOFRAN) injection 4 mg (has no administration in time range)  metoprolol tartrate (LOPRESSOR) tablet 12.5 mg (12.5 mg Oral Given 11/12/22 2227)  heparin bolus via infusion 4,000 Units (4,000 Units Intravenous Bolus from Bag 11/12/22 2028)     IMPRESSION / MDM / ASSESSMENT AND PLAN / ED COURSE  I reviewed the triage vital signs and the nursing notes.  Differential diagnosis includes, but is not limited to, ACS with suspected MI on Friday, low suspicion PE, aortic dissection given absence of pain or symptoms for over 24 hours  Patient's presentation is most consistent with acute presentation with potential threat to life or bodily function.  52 year old male presenting after an episode of chest pain on Friday found to have elevated troponin at urgent care.  EKG with stable T wave abnormalities, does not meet STEMI criteria.  Given significant troponin elevation, will order heparin drip.  Initially significantly hypertensive, but improved without intervention.  Will reach out to hospitalist team to discuss admission.  Reviewed with Dr. Allena Katz.  She will evaluate the patient for anticipated admission.      FINAL CLINICAL IMPRESSION(S) / ED  DIAGNOSES   Final diagnoses:  NSTEMI (non-ST elevated myocardial infarction) (HCC)     Rx / DC Orders   ED Discharge Orders     None        Note:  This document was prepared using Dragon voice recognition software and may include unintentional dictation errors.   Trinna Post, MD 11/12/22 [phone]

## 2022-11-12 NOTE — Assessment & Plan Note (Addendum)
Cont lisinopril. Cont metoprolol.

## 2022-11-12 NOTE — ED Triage Notes (Signed)
Pt states he was out eating dinner 11/09/22 when he finished his mouth started to water, he developed right arm pain and numbness with chest pain. He drove home 35 minutes and felt bad. He continues to have the chest pain and arm numbness.

## 2022-11-13 ENCOUNTER — Encounter
Admission: EM | Disposition: A | Discharge status: Other Acute Inpatient Hospital | Payer: Self-pay | Source: Home / Self Care | Attending: Internal Medicine

## 2022-11-13 ENCOUNTER — Encounter: Payer: Self-pay | Admitting: Internal Medicine

## 2022-11-13 ENCOUNTER — Inpatient Hospital Stay (HOSPITAL_COMMUNITY)
Admit: 2022-11-13 | Discharge: 2022-11-13 | Disposition: A | Discharge status: Home / Self Care | Payer: Commercial Managed Care - PPO | Attending: Medical | Admitting: Medical

## 2022-11-13 ENCOUNTER — Encounter (HOSPITAL_COMMUNITY): Payer: Self-pay

## 2022-11-13 DIAGNOSIS — I214 Non-ST elevation (NSTEMI) myocardial infarction: Secondary | ICD-10-CM | POA: Diagnosis not present

## 2022-11-13 DIAGNOSIS — E785 Hyperlipidemia, unspecified: Secondary | ICD-10-CM | POA: Diagnosis present

## 2022-11-13 DIAGNOSIS — I259 Chronic ischemic heart disease, unspecified: Secondary | ICD-10-CM | POA: Diagnosis not present

## 2022-11-13 DIAGNOSIS — I2510 Atherosclerotic heart disease of native coronary artery without angina pectoris: Secondary | ICD-10-CM | POA: Diagnosis not present

## 2022-11-13 DIAGNOSIS — R079 Chest pain, unspecified: Secondary | ICD-10-CM | POA: Diagnosis not present

## 2022-11-13 HISTORY — PX: LEFT HEART CATH AND CORONARY ANGIOGRAPHY: CATH118249

## 2022-11-13 LAB — LIPID PANEL
Cholesterol: 179 mg/dL (ref 0–200)
HDL: 35 mg/dL — ABNORMAL LOW (ref >40)
LDL Cholesterol: 103 mg/dL — ABNORMAL HIGH (ref 0–99)
Total CHOL/HDL Ratio: 5.1 ratio
Triglycerides: 203 mg/dL — ABNORMAL HIGH (ref <150)
VLDL: 41 mg/dL — ABNORMAL HIGH (ref 0–40)

## 2022-11-13 LAB — ECHOCARDIOGRAM COMPLETE
AR max vel: 3.16 cm2
AV Area VTI: 3.85 cm2
AV Area mean vel: 3.07 cm2
AV Mean grad: 2 mmHg
AV Peak grad: 2.8 mmHg
Ao pk vel: 0.83 m/s
Area-P 1/2: 6.07 cm2
Height: 72 in
MV VTI: 2.44 cm2
S' Lateral: 3.6 cm
Weight: 4624 [oz_av]

## 2022-11-13 LAB — CBC
HCT: 39.2 % (ref 39.0–52.0)
Hemoglobin: 12.4 g/dL — ABNORMAL LOW (ref 13.0–17.0)
MCH: 25.7 pg — ABNORMAL LOW (ref 26.0–34.0)
MCHC: 31.6 g/dL (ref 30.0–36.0)
MCV: 81.3 fL (ref 80.0–100.0)
Platelets: 215 10*3/uL (ref 150–400)
RBC: 4.82 MIL/uL (ref 4.22–5.81)
RDW: 13.6 % (ref 11.5–15.5)
WBC: 6.2 10*3/uL (ref 4.0–10.5)
nRBC: 0 % (ref 0.0–0.2)

## 2022-11-13 LAB — HEMOGLOBIN A1C
Hgb A1c MFr Bld: 6.2 % — ABNORMAL HIGH (ref 4.8–5.6)
Mean Plasma Glucose: 131 mg/dL

## 2022-11-13 LAB — GLUCOSE, CAPILLARY: Glucose-Capillary: 117 mg/dL — ABNORMAL HIGH (ref 70–99)

## 2022-11-13 LAB — HEPARIN LEVEL (UNFRACTIONATED)
Heparin Unfractionated: 0.27 [IU]/mL — ABNORMAL LOW (ref 0.30–0.70)
Heparin Unfractionated: 0.36 [IU]/mL (ref 0.30–0.70)

## 2022-11-13 SURGERY — LEFT HEART CATH AND CORONARY ANGIOGRAPHY
Anesthesia: Moderate Sedation

## 2022-11-13 MED ORDER — SODIUM CHLORIDE 0.9 % IV SOLN: 250.0000 mL | INTRAVENOUS | Status: DC | PRN

## 2022-11-13 MED ORDER — LABETALOL HCL 5 MG/ML IV SOLN: 10.0000 mg | INTRAVENOUS | Status: AC | PRN

## 2022-11-13 MED ORDER — ASPIRIN 81 MG PO TBEC: 81.0000 mg | DELAYED_RELEASE_TABLET | Freq: Every day | ORAL | Status: DC

## 2022-11-13 MED ORDER — LIDOCAINE HCL (PF) 1 % IJ SOLN
INTRAMUSCULAR | Status: DC | PRN
  Administered 2022-11-13: 2 mL

## 2022-11-13 MED ORDER — ASPIRIN 81 MG PO CHEW: 81.0000 mg | CHEWABLE_TABLET | ORAL | Status: DC

## 2022-11-13 MED ORDER — HEPARIN SODIUM (PORCINE) 1000 UNIT/ML IJ SOLN
INTRAMUSCULAR | Status: AC
  Filled 2022-11-13: qty 10

## 2022-11-13 MED ORDER — VERAPAMIL HCL 2.5 MG/ML IV SOLN
INTRAVENOUS | Status: AC
  Filled 2022-11-13: qty 2

## 2022-11-13 MED ORDER — ACETAMINOPHEN 325 MG PO TABS
ORAL_TABLET | ORAL | Status: AC
  Filled 2022-11-13: qty 2

## 2022-11-13 MED ORDER — HEPARIN (PORCINE) IN NACL 1000-0.9 UT/500ML-% IV SOLN
INTRAVENOUS | Status: AC
  Filled 2022-11-13: qty 1000

## 2022-11-13 MED ORDER — VERAPAMIL HCL 2.5 MG/ML IV SOLN
INTRAVENOUS | Status: DC | PRN
  Administered 2022-11-13 (×2): 2.5 mg via INTRA_ARTERIAL

## 2022-11-13 MED ORDER — METOPROLOL TARTRATE 25 MG PO TABS: 12.5000 mg | ORAL_TABLET | Freq: Two times a day (BID) | ORAL | Status: DC

## 2022-11-13 MED ORDER — HEPARIN SODIUM (PORCINE) 1000 UNIT/ML IJ SOLN
INTRAMUSCULAR | Status: DC | PRN
  Administered 2022-11-13: 5000 [IU] via INTRAVENOUS

## 2022-11-13 MED ORDER — SODIUM CHLORIDE 0.9% FLUSH
3.0000 mL | Freq: Two times a day (BID) | INTRAVENOUS | Status: DC
  Administered 2022-11-13 – 2022-11-14 (×2): 3 mL via INTRAVENOUS

## 2022-11-13 MED ORDER — HEPARIN (PORCINE) IN NACL 2000-0.9 UNIT/L-% IV SOLN
INTRAVENOUS | Status: DC | PRN
  Administered 2022-11-13: 1000 mL

## 2022-11-13 MED ORDER — SODIUM CHLORIDE 0.9 % IV SOLN: INTRAVENOUS | Status: AC

## 2022-11-13 MED ORDER — MIDAZOLAM HCL 2 MG/2ML IJ SOLN
INTRAMUSCULAR | Status: AC
  Filled 2022-11-13: qty 2

## 2022-11-13 MED ORDER — HEPARIN BOLUS VIA INFUSION
1600.0000 [IU] | Freq: Once | INTRAVENOUS | Status: AC
  Administered 2022-11-13: 1600 [IU] via INTRAVENOUS
  Filled 2022-11-13: qty 1600

## 2022-11-13 MED ORDER — MIDAZOLAM HCL 2 MG/2ML IJ SOLN
INTRAMUSCULAR | Status: DC | PRN
  Administered 2022-11-13: 2 mg via INTRAVENOUS

## 2022-11-13 MED ORDER — HEPARIN (PORCINE) 25000 UT/250ML-% IV SOLN
1850.0000 [IU]/h | INTRAVENOUS | Status: DC
  Administered 2022-11-13: 1700 [IU]/h via INTRAVENOUS
  Administered 2022-11-14: 1850 [IU]/h via INTRAVENOUS
  Filled 2022-11-13 (×2): qty 250

## 2022-11-13 MED ORDER — HYDRALAZINE HCL 20 MG/ML IJ SOLN: 10.0000 mg | INTRAMUSCULAR | Status: AC | PRN

## 2022-11-13 MED ORDER — FENTANYL CITRATE (PF) 100 MCG/2ML IJ SOLN
INTRAMUSCULAR | Status: AC
  Filled 2022-11-13: qty 2

## 2022-11-13 MED ORDER — SODIUM CHLORIDE 0.9 % WEIGHT BASED INFUSION
3.0000 mL/kg/h | INTRAVENOUS | Status: DC
  Administered 2022-11-13: 3 mL/kg/h via INTRAVENOUS

## 2022-11-13 MED ORDER — SODIUM CHLORIDE 0.9% FLUSH: 3.0000 mL | INTRAVENOUS | Status: DC | PRN

## 2022-11-13 MED ORDER — FENTANYL CITRATE (PF) 100 MCG/2ML IJ SOLN
INTRAMUSCULAR | Status: DC | PRN
  Administered 2022-11-13: 25 ug via INTRAVENOUS

## 2022-11-13 MED ORDER — IOHEXOL 300 MG/ML  SOLN
INTRAMUSCULAR | Status: DC | PRN
  Administered 2022-11-13: 50 mL

## 2022-11-13 MED ORDER — ATORVASTATIN CALCIUM 40 MG PO TABS: 40.0000 mg | ORAL_TABLET | Freq: Every day | ORAL | Status: DC

## 2022-11-13 MED ORDER — SODIUM CHLORIDE 0.9 % WEIGHT BASED INFUSION: 1.0000 mL/kg/h | INTRAVENOUS | Status: DC

## 2022-11-13 SURGICAL SUPPLY — 12 items
CATH INFINITI AMBI 5FR JK (CATHETERS) IMPLANT
CATH INFINITI AMBI 5FR TG (CATHETERS) IMPLANT
CATH INFINITI JR4 5F (CATHETERS) IMPLANT
DEVICE RAD TR BAND REGULAR (VASCULAR PRODUCTS) IMPLANT
DRAPE BRACHIAL (DRAPES) IMPLANT
GLIDESHEATH SLEND SS 6F .021 (SHEATH) IMPLANT
GUIDEWIRE INQWIRE 1.5J.035X260 (WIRE) IMPLANT
INQWIRE 1.5J .035X260CM (WIRE) ×1
PACK CARDIAC CATH (CUSTOM PROCEDURE TRAY) ×1 IMPLANT
PROTECTION STATION PRESSURIZED (MISCELLANEOUS) ×1
SET ATX-X65L (MISCELLANEOUS) IMPLANT
STATION PROTECTION PRESSURIZED (MISCELLANEOUS) IMPLANT

## 2022-11-13 NOTE — Discharge Summary (Signed)
Exam: 11/13/2022 Medical Rec #:  [id number]      Height:       72.0 in Accession #:    [id number]     Weight:       289.0 lb Date of Birth:  1970/11/24      BSA:          2.491 m Patient Age:    52 years       BP:           142/81 mmHg Patient Gender: M              HR:           72 bpm. Exam Location:  [facility] Procedure: 2D Echo, Cardiac Doppler and Color Doppler Indications:     Chest pain R07.9  History:         Patient has no prior history of Echocardiogram examinations.                  Risk Factors:Hypertension.  Sonographer:     Cristela Blue Referring  Phys:  [id number] CADENCE H FURTH Diagnosing Phys: Yvonne Kendall MD IMPRESSIONS  1. Left ventricular ejection fraction, by estimation, is 45 to 50%. The left ventricle has mildly decreased function. The left ventricle demonstrates regional wall motion abnormalities (see scoring diagram/findings for description). There is moderate left ventricular hypertrophy. Left ventricular diastolic parameters are consistent with Grade II diastolic dysfunction (pseudonormalization). Elevated left atrial pressure. There is severe hypokinesis of the left ventricular, basal-mid inferior wall and inferolateral wall.  2. Right ventricular systolic function is normal. The right ventricular size is normal. Tricuspid regurgitation signal is inadequate for assessing PA pressure.  3. The mitral valve is degenerative. Trivial mitral valve regurgitation. No evidence of mitral stenosis.  4. The aortic valve was not well visualized. Aortic valve regurgitation is not visualized. No aortic stenosis is present. FINDINGS  Left Ventricle: Left ventricular ejection fraction, by estimation, is 45 to 50%. The left ventricle has mildly decreased function. The left ventricle demonstrates regional wall motion abnormalities. Severe hypokinesis of the left ventricular, basal-mid inferior wall and inferolateral wall. The left ventricular internal cavity size was normal in size. There is moderate left ventricular hypertrophy. Left ventricular diastolic parameters are consistent with Grade II diastolic dysfunction (pseudonormalization). Elevated left atrial pressure. Right Ventricle: The right ventricular size is normal. Right vetricular wall thickness was not well visualized. Right ventricular systolic function is normal. Tricuspid regurgitation signal is inadequate for assessing PA pressure. Left Atrium: Left atrial size was normal in size. Right Atrium: Right atrial size was normal in size. Pericardium: The pericardium was not well visualized. Mitral Valve:  The mitral valve is degenerative in appearance. There is mild thickening of the mitral valve leaflet(s). Trivial mitral valve regurgitation. No evidence of mitral valve stenosis. MV peak gradient, 4.8 mmHg. The mean mitral valve gradient is  1.0 mmHg. Tricuspid Valve: The tricuspid valve is not well visualized. Tricuspid valve regurgitation is trivial. Aortic Valve: The aortic valve was not well visualized. Aortic valve regurgitation is not visualized. No aortic stenosis is present. Aortic valve mean gradient measures 2.0 mmHg. Aortic valve peak gradient measures 2.8 mmHg. Aortic valve area, by VTI measures 3.85 cm. Pulmonic Valve: The pulmonic valve was not well visualized. Pulmonic valve regurgitation is not visualized. No evidence of pulmonic stenosis. Aorta: The aortic root is normal in size and structure. Pulmonary Artery: The pulmonary artery is not well seen. IAS/Shunts: The interatrial septum was not well visualized.  LEFT  Exam: 11/13/2022 Medical Rec #:  [id number]      Height:       72.0 in Accession #:    [id number]     Weight:       289.0 lb Date of Birth:  1970/11/24      BSA:          2.491 m Patient Age:    52 years       BP:           142/81 mmHg Patient Gender: M              HR:           72 bpm. Exam Location:  [facility] Procedure: 2D Echo, Cardiac Doppler and Color Doppler Indications:     Chest pain R07.9  History:         Patient has no prior history of Echocardiogram examinations.                  Risk Factors:Hypertension.  Sonographer:     Cristela Blue Referring  Phys:  [id number] CADENCE H FURTH Diagnosing Phys: Yvonne Kendall MD IMPRESSIONS  1. Left ventricular ejection fraction, by estimation, is 45 to 50%. The left ventricle has mildly decreased function. The left ventricle demonstrates regional wall motion abnormalities (see scoring diagram/findings for description). There is moderate left ventricular hypertrophy. Left ventricular diastolic parameters are consistent with Grade II diastolic dysfunction (pseudonormalization). Elevated left atrial pressure. There is severe hypokinesis of the left ventricular, basal-mid inferior wall and inferolateral wall.  2. Right ventricular systolic function is normal. The right ventricular size is normal. Tricuspid regurgitation signal is inadequate for assessing PA pressure.  3. The mitral valve is degenerative. Trivial mitral valve regurgitation. No evidence of mitral stenosis.  4. The aortic valve was not well visualized. Aortic valve regurgitation is not visualized. No aortic stenosis is present. FINDINGS  Left Ventricle: Left ventricular ejection fraction, by estimation, is 45 to 50%. The left ventricle has mildly decreased function. The left ventricle demonstrates regional wall motion abnormalities. Severe hypokinesis of the left ventricular, basal-mid inferior wall and inferolateral wall. The left ventricular internal cavity size was normal in size. There is moderate left ventricular hypertrophy. Left ventricular diastolic parameters are consistent with Grade II diastolic dysfunction (pseudonormalization). Elevated left atrial pressure. Right Ventricle: The right ventricular size is normal. Right vetricular wall thickness was not well visualized. Right ventricular systolic function is normal. Tricuspid regurgitation signal is inadequate for assessing PA pressure. Left Atrium: Left atrial size was normal in size. Right Atrium: Right atrial size was normal in size. Pericardium: The pericardium was not well visualized. Mitral Valve:  The mitral valve is degenerative in appearance. There is mild thickening of the mitral valve leaflet(s). Trivial mitral valve regurgitation. No evidence of mitral valve stenosis. MV peak gradient, 4.8 mmHg. The mean mitral valve gradient is  1.0 mmHg. Tricuspid Valve: The tricuspid valve is not well visualized. Tricuspid valve regurgitation is trivial. Aortic Valve: The aortic valve was not well visualized. Aortic valve regurgitation is not visualized. No aortic stenosis is present. Aortic valve mean gradient measures 2.0 mmHg. Aortic valve peak gradient measures 2.8 mmHg. Aortic valve area, by VTI measures 3.85 cm. Pulmonic Valve: The pulmonic valve was not well visualized. Pulmonic valve regurgitation is not visualized. No evidence of pulmonic stenosis. Aorta: The aortic root is normal in size and structure. Pulmonary Artery: The pulmonary artery is not well seen. IAS/Shunts: The interatrial septum was not well visualized.  LEFT  Exam: 11/13/2022 Medical Rec #:  [id number]      Height:       72.0 in Accession #:    [id number]     Weight:       289.0 lb Date of Birth:  1970/11/24      BSA:          2.491 m Patient Age:    52 years       BP:           142/81 mmHg Patient Gender: M              HR:           72 bpm. Exam Location:  [facility] Procedure: 2D Echo, Cardiac Doppler and Color Doppler Indications:     Chest pain R07.9  History:         Patient has no prior history of Echocardiogram examinations.                  Risk Factors:Hypertension.  Sonographer:     Cristela Blue Referring  Phys:  [id number] CADENCE H FURTH Diagnosing Phys: Yvonne Kendall MD IMPRESSIONS  1. Left ventricular ejection fraction, by estimation, is 45 to 50%. The left ventricle has mildly decreased function. The left ventricle demonstrates regional wall motion abnormalities (see scoring diagram/findings for description). There is moderate left ventricular hypertrophy. Left ventricular diastolic parameters are consistent with Grade II diastolic dysfunction (pseudonormalization). Elevated left atrial pressure. There is severe hypokinesis of the left ventricular, basal-mid inferior wall and inferolateral wall.  2. Right ventricular systolic function is normal. The right ventricular size is normal. Tricuspid regurgitation signal is inadequate for assessing PA pressure.  3. The mitral valve is degenerative. Trivial mitral valve regurgitation. No evidence of mitral stenosis.  4. The aortic valve was not well visualized. Aortic valve regurgitation is not visualized. No aortic stenosis is present. FINDINGS  Left Ventricle: Left ventricular ejection fraction, by estimation, is 45 to 50%. The left ventricle has mildly decreased function. The left ventricle demonstrates regional wall motion abnormalities. Severe hypokinesis of the left ventricular, basal-mid inferior wall and inferolateral wall. The left ventricular internal cavity size was normal in size. There is moderate left ventricular hypertrophy. Left ventricular diastolic parameters are consistent with Grade II diastolic dysfunction (pseudonormalization). Elevated left atrial pressure. Right Ventricle: The right ventricular size is normal. Right vetricular wall thickness was not well visualized. Right ventricular systolic function is normal. Tricuspid regurgitation signal is inadequate for assessing PA pressure. Left Atrium: Left atrial size was normal in size. Right Atrium: Right atrial size was normal in size. Pericardium: The pericardium was not well visualized. Mitral Valve:  The mitral valve is degenerative in appearance. There is mild thickening of the mitral valve leaflet(s). Trivial mitral valve regurgitation. No evidence of mitral valve stenosis. MV peak gradient, 4.8 mmHg. The mean mitral valve gradient is  1.0 mmHg. Tricuspid Valve: The tricuspid valve is not well visualized. Tricuspid valve regurgitation is trivial. Aortic Valve: The aortic valve was not well visualized. Aortic valve regurgitation is not visualized. No aortic stenosis is present. Aortic valve mean gradient measures 2.0 mmHg. Aortic valve peak gradient measures 2.8 mmHg. Aortic valve area, by VTI measures 3.85 cm. Pulmonic Valve: The pulmonic valve was not well visualized. Pulmonic valve regurgitation is not visualized. No evidence of pulmonic stenosis. Aorta: The aortic root is normal in size and structure. Pulmonary Artery: The pulmonary artery is not well seen. IAS/Shunts: The interatrial septum was not well visualized.  LEFT  Exam: 11/13/2022 Medical Rec #:  [id number]      Height:       72.0 in Accession #:    [id number]     Weight:       289.0 lb Date of Birth:  1970/11/24      BSA:          2.491 m Patient Age:    52 years       BP:           142/81 mmHg Patient Gender: M              HR:           72 bpm. Exam Location:  [facility] Procedure: 2D Echo, Cardiac Doppler and Color Doppler Indications:     Chest pain R07.9  History:         Patient has no prior history of Echocardiogram examinations.                  Risk Factors:Hypertension.  Sonographer:     Cristela Blue Referring  Phys:  [id number] CADENCE H FURTH Diagnosing Phys: Yvonne Kendall MD IMPRESSIONS  1. Left ventricular ejection fraction, by estimation, is 45 to 50%. The left ventricle has mildly decreased function. The left ventricle demonstrates regional wall motion abnormalities (see scoring diagram/findings for description). There is moderate left ventricular hypertrophy. Left ventricular diastolic parameters are consistent with Grade II diastolic dysfunction (pseudonormalization). Elevated left atrial pressure. There is severe hypokinesis of the left ventricular, basal-mid inferior wall and inferolateral wall.  2. Right ventricular systolic function is normal. The right ventricular size is normal. Tricuspid regurgitation signal is inadequate for assessing PA pressure.  3. The mitral valve is degenerative. Trivial mitral valve regurgitation. No evidence of mitral stenosis.  4. The aortic valve was not well visualized. Aortic valve regurgitation is not visualized. No aortic stenosis is present. FINDINGS  Left Ventricle: Left ventricular ejection fraction, by estimation, is 45 to 50%. The left ventricle has mildly decreased function. The left ventricle demonstrates regional wall motion abnormalities. Severe hypokinesis of the left ventricular, basal-mid inferior wall and inferolateral wall. The left ventricular internal cavity size was normal in size. There is moderate left ventricular hypertrophy. Left ventricular diastolic parameters are consistent with Grade II diastolic dysfunction (pseudonormalization). Elevated left atrial pressure. Right Ventricle: The right ventricular size is normal. Right vetricular wall thickness was not well visualized. Right ventricular systolic function is normal. Tricuspid regurgitation signal is inadequate for assessing PA pressure. Left Atrium: Left atrial size was normal in size. Right Atrium: Right atrial size was normal in size. Pericardium: The pericardium was not well visualized. Mitral Valve:  The mitral valve is degenerative in appearance. There is mild thickening of the mitral valve leaflet(s). Trivial mitral valve regurgitation. No evidence of mitral valve stenosis. MV peak gradient, 4.8 mmHg. The mean mitral valve gradient is  1.0 mmHg. Tricuspid Valve: The tricuspid valve is not well visualized. Tricuspid valve regurgitation is trivial. Aortic Valve: The aortic valve was not well visualized. Aortic valve regurgitation is not visualized. No aortic stenosis is present. Aortic valve mean gradient measures 2.0 mmHg. Aortic valve peak gradient measures 2.8 mmHg. Aortic valve area, by VTI measures 3.85 cm. Pulmonic Valve: The pulmonic valve was not well visualized. Pulmonic valve regurgitation is not visualized. No evidence of pulmonic stenosis. Aorta: The aortic root is normal in size and structure. Pulmonary Artery: The pulmonary artery is not well seen. IAS/Shunts: The interatrial septum was not well visualized.  LEFT  Physician Discharge Summary   Patient: Alexander Simon MRN: [id number] DOB: November 09, 1970  Admit date:     11/12/2022  Discharge date: 11/14/2022  Discharge Physician: Lurene Shadow   PCP: Pcp, No   Recommendations at discharge:   Follow-up with cardiologist and cardiothoracic surgeon at [facility] within 24 hours of discharge  Discharge Diagnoses: Principal Problem:   Chest pain Active Problems:   NSTEMI (non-ST elevated myocardial infarction) (HCC)   Hypertension   Obesity (BMI 30-39.9)   Prediabetes  Resolved Problems:   * No resolved hospital problems. *  Hospital Course:  KENNAN MCDOLE is a 52 y.o. male with medical history significant for hypertension, obesity, who presented to the hospital for evaluation for chest pain that occurred about 4 days prior to admission.  He had developed chest pain while he was walking out of a medical a restaurant last Friday evening but he thought it was indigestion.  Pain got worse with exertion.  His chest pain improved but he decided to visit the urgent care clinic on Monday (day prior to admission) for further evaluation.  At the urgent care clinic, he was found to have elevated troponins and he was referred to the emergency department for further management.   He was admitted to the hospital for acute NSTEMI and hypertensive urgency. He was treated with IV heparin drip, aspirin, statin and metoprolol.  He had an left heart catheterization which revealed multivessel coronary artery disease.  Dr. Okey Dupre, cardiologist, recommended that patient be transferred to [facility] for evaluation for CABG.  Patient agrees with discharge plan. Dr. Jacques Navy, cardiologist, is the accepting physician.       Consultants: Cardiologist Procedures performed: Left heart cath Disposition:  [facility] Diet recommendation:  Discharge Diet Orders (From admission, onward)     Start     Ordered   11/13/22 0000  Diet - low sodium heart  healthy        11/13/22 1706           Cardiac diet DISCHARGE MEDICATION: Allergies as of 11/13/2022   No Known Allergies      Medication List     TAKE these medications    aspirin EC 81 MG tablet Take 1 tablet (81 mg total) by mouth daily. Swallow whole. Start taking on: November 15, 2022   atorvastatin 40 MG tablet Commonly known as: LIPITOR Take 1 tablet (40 mg total) by mouth daily at 6 PM.   lisinopril 30 MG tablet Commonly known as: ZESTRIL Take 1 tablet (30 mg total) by mouth daily.   metoprolol tartrate 25 MG tablet Commonly known as: LOPRESSOR Take 0.5 tablets (12.5 mg total) by mouth 2 (two) times daily.   sildenafil 100 MG tablet Commonly known as: VIAGRA Take 1 tablet (100 mg total) by mouth daily as needed for erectile dysfunction.        Discharge Exam: Filed Weights   11/12/22 1949 11/13/22 1425  Weight: 131.1 kg 132 kg   GEN: NAD SKIN: Warm and dry EYES: EOMI ENT: MMM CV: RRR PULM: CTA B ABD: soft, obese, NT, +BS CNS: AAO x 3, non focal EXT: No edema or tenderness      Condition at discharge: good  The results of significant diagnostics from this hospitalization (including imaging, microbiology, ancillary and laboratory) are listed below for reference.   Imaging Studies: ECHOCARDIOGRAM COMPLETE  Result Date: 11/13/2022    ECHOCARDIOGRAM REPORT   Patient Name:   HENNING RUPARD Mccollum Date of

## 2022-11-13 NOTE — ED Notes (Signed)
Pt repositioned in bed.

## 2022-11-13 NOTE — Consult Note (Signed)
Cardiology Consultation   Patient ID: Alexander Simon MRN: [id number]; DOB: 06-19-1970  Admit date: 11/12/2022 Date of Consult: 11/13/2022  PCP:  Oneita Hurt No   [facility] Providers Cardiologist:  New   Patient Profile:   Alexander Simon is a 52 y.o. male with a hx of hypertension, obesity, prediabetic who is being seen 11/13/2022 for the evaluation of chest pain and elevated troponin at the request of Dr. Myriam Forehand.  History of Present Illness:   Mr. Stege was seen in 2019 by Dr. Mariah Milling for atypical chest pain in the setting of coughing and severely elevated BP. Troponin was negative x 3. Lisinopril was increased.    No history of MI. No alcohol, drug, or tobacco use. He takes Lisinopril for BP. He does not have PCP, has been year.   Patient presented to the ER 11/12/2022 for chest pain.Symptoms Friday: chest pain, numbness in right arm, SOB. After he got home from a restaurant he started having nausea. Chest pain was worse when he walked. He thought it was indigestion. Pain got more severe. Over the weekend he felt OK. BP was normal and he went to work Saturday. He felt sore in his chest area. Monday, he went to work and he went to an Urgent care. At urgent care BP was high and troponin was elevated and he was sent to the ER.   In the ER blood pressure 197/104, pulse rate 89 bpm, respiratory rate 18, afebrile, 100% O2.  High-sensitivity troponin 762>593.  Chest x-ray unremarkable.  D-dimer normal.  EKG shows normal sinus rhythm, anterior inferior T wave inversion/normal ST changes.The patient was started on IV heparin and admitted for further work-up.    Past Medical History:  Diagnosis Date   GERD (gastroesophageal reflux disease)    no meds   Hypertension    No pertinent past medical history     Past Surgical History:  Procedure Laterality Date   CIRCUMCISION     EXCISION MASS NECK Right 06/12/2019   Procedure: EXCISION Superficial MASS NECK;  Surgeon: Duanne Guess, MD;   Location: [facility] ORS;  Service: General;  Laterality: Right;   EXCISION OF SKIN TAG Bilateral 06/12/2019   Procedure: EXCISION OF SKIN TAG on neck;  Surgeon: Duanne Guess, MD;  Location: [facility] ORS;  Service: General;  Laterality: Bilateral;   SHOULDER SURGERY Right 2014     Home Medications:  Prior to Admission medications   Medication Sig Start Date End Date Taking? Authorizing Provider  lisinopril (ZESTRIL) 30 MG tablet Take 1 tablet (30 mg total) by mouth daily. 07/16/22  Yes Margaretann Loveless, PA-C  sildenafil (VIAGRA) 100 MG tablet Take 1 tablet (100 mg total) by mouth daily as needed for erectile dysfunction. 07/26/22  Yes Valinda Hoar, NP    Inpatient Medications: Scheduled Meds:  aspirin EC  81 mg Oral Daily   atorvastatin  40 mg Oral q1800   lisinopril  30 mg Oral Daily   metoprolol tartrate  12.5 mg Oral BID   sodium chloride flush  3 mL Intravenous Q12H   Continuous Infusions:  sodium chloride     heparin 1,700 Units/hr (11/13/22 0726)   PRN Meds: sodium chloride, acetaminophen, nitroGLYCERIN, ondansetron (ZOFRAN) IV, sodium chloride flush  Allergies:   No Known Allergies  Social History:   Social History   Socioeconomic History   Marital status: Single    Spouse name: Not on file   Number of children: Not on file   Years of education:  Not on file   Highest education level: Not on file  Occupational History   Not on file  Tobacco Use   Smoking status: Never   Smokeless tobacco: Never  Vaping Use   Vaping status: Never Used  Substance and Sexual Activity   Alcohol use: No    Alcohol/week: 0.0 standard drinks of alcohol   Drug use: Never   Sexual activity: Yes  Other Topics Concern   Not on file  Social History Narrative   Not on file   Social Determinants of Health   Financial Resource Strain: Not on file  Food Insecurity: Not on file  Transportation Needs: Not on file  Physical Activity: Not on file  Stress: Not on file  Social  Connections: Not on file  Intimate Partner Violence: Not on file    Family History:    Family History  Problem Relation Age of Onset   Breast cancer Mother    Lung cancer Mother    Cancer Maternal Uncle      ROS:  Please see the history of present illness.   All other ROS reviewed and negative.     Physical Exam/Data:   Vitals:   11/13/22 0415 11/13/22 0428 11/13/22 0430 11/13/22 0630  BP:   130/71 125/79  Pulse: 65  65 70  Resp: 20  18 16   Temp:  98.3 F (36.8 C)    TempSrc:  Oral    SpO2: 95%  96% 99%  Weight:      Height:       No intake or output data in the 24 hours ending 11/13/22 0737    11/12/2022    7:49 PM 07/26/2022    6:05 PM 12/10/2021   12:07 PM  Last 3 Weights  Weight (lbs) 289 lb 279 lb 282 lb  Weight (kg) 131.09 kg 126.554 kg 127.914 kg     Body mass index is 39.2 kg/m.  General:  Well nourished, well developed, in no acute distress HEENT: normal Neck: no JVD Vascular: No carotid bruits; Distal pulses 2+ bilaterally Cardiac:  normal S1, S2; RRR; no murmur  Lungs:  clear to auscultation bilaterally, no wheezing, rhonchi or rales  Abd: soft, nontender, no hepatomegaly  Ext: no edema Musculoskeletal:  No deformities, BUE and BLE strength normal and equal Skin: warm and dry  Neuro:  CNs 2-12 intact, no focal abnormalities noted Psych:  Normal affect   EKG:  The EKG was personally reviewed and demonstrates:  NSR TWI anterolateral leads with minimal ST changes (no change) Telemetry:  Telemetry was personally reviewed and demonstrates:  NSR, HR 60-70s, PVCs  Relevant CV Studies:  Echo ordered  Laboratory Data:  High Sensitivity Troponin:   Recent Labs  Lab 11/12/22 1809 11/12/22 2122  TROPONINIHS 762* 593*     Chemistry Recent Labs  Lab 11/12/22 1809  NA 137  K 4.4  CL 103  CO2 27  GLUCOSE 91  BUN 15  CREATININE 1.15  CALCIUM 9.3  GFRNONAA >60  ANIONGAP 7    Recent Labs  Lab 11/12/22 2000  PROT 7.9  ALBUMIN 4.0  AST  23  ALT 34  ALKPHOS 50  BILITOT 0.9   Lipids  Recent Labs  Lab 11/13/22 0503  CHOL 179  TRIG 203*  HDL 35*  LDLCALC 103*  CHOLHDL 5.1    Hematology Recent Labs  Lab 11/12/22 1809 11/13/22 0503  WBC 7.2 6.2  RBC 4.99 4.82  HGB 12.9* 12.4*  HCT 40.9 39.2  MCV  82.0 81.3  MCH 25.9* 25.7*  MCHC 31.5 31.6  RDW 14.0 13.6  PLT 220 215   Thyroid  Recent Labs  Lab 11/12/22 2122  TSH 1.991  FREET4 0.81    BNPNo results for input(s): "BNP", "PROBNP" in the last 168 hours.  DDimer  Recent Labs  Lab 11/12/22 2029  DDIMER 0.28     Radiology/Studies:  DG Chest Port 1 View  Result Date: 11/12/2022 CLINICAL DATA:  Chest pain on the left for 3 days, initial encounter EXAM: PORTABLE CHEST 1 VIEW COMPARISON:  None Available. FINDINGS: The heart size and mediastinal contours are within normal limits. Both lungs are clear. The visualized skeletal structures are unremarkable. IMPRESSION: No active disease. Electronically Signed   By: Alcide Clever M.D.   On: 11/12/2022 21:31     Assessment and Plan:   Chest pain NSTEMI - patient presented with chest pain found to have elevated troponin and elevated blood pressure - HS troponin 762>593 - EKG with no changes - continue IV heparin - check echo - continue Aspirin 81mg  daily, Lipitor 40mg  daily, Lopressor 12.5mg  BID and Lisinopril 30mg  daily - plan for cardiac cath Risks and benefits of cardiac catheterization have been discussed with the patient.  These include bleeding, infection, kidney damage, stroke, heart attack, death.  The patient understands these risks and is willing to proceed.   HTN - PTA lisinopril - elevated on admission - lisinopril 30mg  daily and Lopressor 12.5mg  BID - BP better  HLD - LDL 103 - continue Lipitor 40mg  daily   For questions or updates, please contact [facility] Please consult [email or web address] for contact info under    Signed, Jamarion [place] David Stall, PA-C  11/13/2022 7:37 AM

## 2022-11-13 NOTE — Brief Op Note (Signed)
BRIEF CARDIAC CATHETERIZATION NOTE  DATE: 11/13/2022  TIME: 4:49 PM  PATIENT:  [name]  52 y.o. male  PRE-OPERATIVE DIAGNOSIS:  NSTEMI  POST-OPERATIVE DIAGNOSIS:  Same  PROCEDURE:  Procedure(s): LEFT HEART CATH AND CORONARY ANGIOGRAPHY (N/A)  SURGEON:  Surgeons and Role:    * Sergi Gellner, Cristal Deer, MD - Primary  FINDINGS: Multivessel CAD, including 50% ostial LMCA stenosis, mild luminal irregularities involving the LAD, 90% ostial and mid/distal LCx lesions, and sequential 50-60% RCA stenoses. LVEDP 18 mmHg.  RECOMMENDATIONS: Transfer to [facility] for CABG evaluation. Resume heparin infusion 2 hours after TR band removal. Aggressive secondary prevention of CAD.  Yvonne Kendall, MD [facility]

## 2022-11-13 NOTE — Progress Notes (Signed)
ANTICOAGULATION CONSULT NOTE  Pharmacy Consult for heparin infusion Indication: chest pain/ACS  No Known Allergies  Patient Measurements: Height: 6' (182.9 cm) Weight: 131.1 kg (289 lb) IBW/kg (Calculated) : 77.6 Heparin Dosing Weight: 107.2 kg  Vital Signs: Temp: 98.3 F (36.8 C) (09/17 0428) Temp Source: Oral (09/17 0428) BP: 137/72 (09/17 0400) Pulse Rate: 65 (09/17 0415)  Labs: Recent Labs    11/12/22 1809 11/12/22 2000 11/12/22 2122 11/13/22 0503  HGB 12.9*  --   --  12.4*  HCT 40.9  --   --  39.2  PLT 220  --   --  215  APTT  --  28  --   --   LABPROT  --  13.4  --   --   INR  --  1.0  --   --   HEPARINUNFRC  --   --   --  0.27*  CREATININE 1.15  --   --   --   TROPONINIHS 762*  --  593*  --     Estimated Creatinine Clearance: 105.2 mL/min (by C-G formula based on SCr of 1.15 mg/dL).   Medical History: Past Medical History:  Diagnosis Date   GERD (gastroesophageal reflux disease)    no meds   Hypertension    No pertinent past medical history     Assessment: 52 y.o. male w/ PMH of HTN, GERD presents with chest pain. A review of medical records reveals no chronic anticoagulation prior to arrival.  Goal of Therapy:  Heparin level 0.3-0.7 units/ml Monitor platelets by anticoagulation protocol: Yes  0917 0503 HL 0.27   Plan: Bolus 1600 units x 1 Increase heparin infusion to 1700 units/hr Recheck HL in 6 hr after rate change CBC daily while on heparin.  Otelia Sergeant, PharmD, [facility] 11/13/2022 5:41 AM

## 2022-11-13 NOTE — Progress Notes (Signed)
*  PRELIMINARY RESULTS* Echocardiogram 2D Echocardiogram has been performed.  Cristela Blue 11/13/2022, 9:06 AM

## 2022-11-13 NOTE — ED Notes (Signed)
Patient ambulating to restroom with steady gait  at this time

## 2022-11-13 NOTE — ED Notes (Signed)
Patient returned to bed and given a cup of ginger ale, per patient request. Patient given call light and bed placed in lowest position.

## 2022-11-13 NOTE — H&P (Cosign Needed Addendum)
4. The aortic valve was not well visualized. Aortic valve regurgitation  is not visualized. No aortic stenosis is present.   Laboratory Data:  High Sensitivity Troponin:   Recent Labs  Lab 11/12/22 1809 11/12/22 2122  TROPONINIHS 762* 593*      Chemistry Recent Labs  Lab 11/12/22 1809  NA 137  K 4.4  CL 103  CO2 27  GLUCOSE 91  BUN 15  CREATININE 1.15  CALCIUM 9.3  GFRNONAA >60  ANIONGAP 7    Recent Labs  Lab 11/12/22 2000  PROT 7.9  ALBUMIN 4.0  AST 23  ALT 34  ALKPHOS 50  BILITOT 0.9   Lipids  Recent Labs  Lab 11/13/22 0503  CHOL 179  TRIG 203*  HDL 35*  LDLCALC 103*  CHOLHDL 5.1   Hematology Recent Labs  Lab 11/12/22 1809 11/13/22 0503  WBC 7.2 6.2  RBC 4.99 4.82  HGB 12.9* 12.4*  HCT 40.9 39.2  MCV 82.0 81.3  MCH 25.9* 25.7*  MCHC 31.5 31.6  RDW 14.0 13.6  PLT 220 215   Thyroid  Recent Labs  Lab 11/12/22 2122  TSH 1.991  FREET4 0.81   BNPNo results for input(s): "BNP", "PROBNP" in the last 168 hours.  DDimer  Recent Labs  Lab 11/12/22 2029  DDIMER 0.28     Radiology/Studies:  ECHOCARDIOGRAM COMPLETE  Result Date: 11/13/2022    ECHOCARDIOGRAM REPORT   Patient Name:   Alexander Simon Toth Date of Exam: 11/13/2022 Medical Rec #:  [id number]      Height:       72.0 in Accession #:    [id number]     Weight:       289.0 lb Date of Birth:   Jan 07, 1971      BSA:          2.491 m Patient Age:    52 years       BP:           142/81 mmHg Patient Gender: M              HR:           72 bpm. Exam Location:  [facility] Procedure: 2D Echo, Cardiac Doppler and Color Doppler Indications:     Chest pain R07.9  History:         Patient has no prior history of Echocardiogram examinations.                  Risk Factors:Hypertension.  Sonographer:     Cristela Blue Referring Phys:  [id number] Nevelyn Mellott H Kirbie Stodghill Diagnosing Phys: Yvonne Kendall MD IMPRESSIONS  1. Left ventricular ejection fraction, by estimation, is 45 to 50%. The left ventricle has mildly decreased function. The left ventricle demonstrates regional wall motion abnormalities (see scoring diagram/findings for description). There is moderate left ventricular hypertrophy. Left ventricular diastolic parameters are consistent with Grade II diastolic dysfunction (pseudonormalization). Elevated left atrial pressure. There is severe hypokinesis of the left ventricular, basal-mid inferior wall and inferolateral wall.  2. Right ventricular systolic function is normal. The right ventricular size is normal. Tricuspid regurgitation signal is inadequate for assessing PA pressure.  3. The mitral valve is degenerative. Trivial mitral valve regurgitation. No evidence of mitral stenosis.  4. The aortic valve was not well visualized. Aortic valve regurgitation is not visualized. No aortic stenosis is present. FINDINGS  Left Ventricle: Left ventricular ejection fraction, by estimation, is 45 to 50%. The left ventricle has mildly decreased function. The left ventricle demonstrates  Cardiology Admission History and Physical   Patient ID: KHADEEM PAOLA MRN: [id number]; DOB: Sep 26, 1970   Admission date: 11/12/2022  PCP:  Oneita Hurt, No    HeartCare Providers Cardiologist: New  Chief Complaint: Chest pain  Patient Profile:   Alexander Simon is a 52 y.o. male with history of hypertension, obesity, prediabetes who is being seen 11/13/2022 for the evaluation of non-STEMI.  History of Present Illness:   Mr. Makowski has no prior history of MI.  No alcohol, drug, or tobacco use.  He takes lisinopril for blood pressure.  He does not have a PCP and has not been seen in years.  Patient presented to the ER at [facility] 11/12/2022 with chest pain.  Symptoms started Friday with chest pain, numbness in the right arm and shortness of breath.  After he got home from a restaurant that night he started having nausea.  Chest pain was worse when he walked.  Over the weekend he felt okay.  Blood pressure was normal.  Monday, he went to work and started having similar symptoms and went to an urgent care.  At the urgent care, blood pressure was high and troponin was elevated and he was sent to the ER.  The ER, high-sensitivity troponin 762, 593.  Blood pressure was severely elevated.  He was started on IV heparin and admitted.  Left heart cath on 11/13/2022 showed severe three-vessel CAD, and patient was transferred to [facility] for CABG consult.   Past Medical History:  Diagnosis Date   GERD (gastroesophageal reflux disease)    no meds   Hypertension    No pertinent past medical history     Past Surgical History:  Procedure Laterality Date   CIRCUMCISION     EXCISION MASS NECK Right 06/12/2019   Procedure: EXCISION Superficial MASS NECK;  Surgeon: Duanne Guess, MD;  Location: [facility] ORS;  Service: General;  Laterality: Right;   EXCISION OF SKIN TAG Bilateral 06/12/2019   Procedure: EXCISION OF SKIN TAG on neck;  Surgeon: Duanne Guess, MD;  Location: [facility] ORS;  Service: General;   Laterality: Bilateral;   SHOULDER SURGERY Right 2014     Medications Prior to Admission: Prior to Admission medications   Medication Sig Start Date End Date Taking? Authorizing Provider  lisinopril (ZESTRIL) 30 MG tablet Take 1 tablet (30 mg total) by mouth daily. 07/16/22  Yes Margaretann Loveless, PA-C  sildenafil (VIAGRA) 100 MG tablet Take 1 tablet (100 mg total) by mouth daily as needed for erectile dysfunction. 07/26/22  Yes White, Elita Boone, NP     Allergies:   No Known Allergies  Social History:   Social History   Socioeconomic History   Marital status: Single    Spouse name: Not on file   Number of children: Not on file   Years of education: Not on file   Highest education level: Not on file  Occupational History   Not on file  Tobacco Use   Smoking status: Never   Smokeless tobacco: Never  Vaping Use   Vaping status: Never Used  Substance and Sexual Activity   Alcohol use: No    Alcohol/week: 0.0 standard drinks of alcohol   Drug use: Never   Sexual activity: Yes  Other Topics Concern   Not on file  Social History Narrative   Not on file   Social Determinants of Health   Financial Resource Strain: Not on file  Food Insecurity: Not on file  Transportation Needs: Not on file  Cardiology Admission History and Physical   Patient ID: KHADEEM PAOLA MRN: [id number]; DOB: Sep 26, 1970   Admission date: 11/12/2022  PCP:  Oneita Hurt, No    HeartCare Providers Cardiologist: New  Chief Complaint: Chest pain  Patient Profile:   Alexander Simon is a 52 y.o. male with history of hypertension, obesity, prediabetes who is being seen 11/13/2022 for the evaluation of non-STEMI.  History of Present Illness:   Mr. Makowski has no prior history of MI.  No alcohol, drug, or tobacco use.  He takes lisinopril for blood pressure.  He does not have a PCP and has not been seen in years.  Patient presented to the ER at [facility] 11/12/2022 with chest pain.  Symptoms started Friday with chest pain, numbness in the right arm and shortness of breath.  After he got home from a restaurant that night he started having nausea.  Chest pain was worse when he walked.  Over the weekend he felt okay.  Blood pressure was normal.  Monday, he went to work and started having similar symptoms and went to an urgent care.  At the urgent care, blood pressure was high and troponin was elevated and he was sent to the ER.  The ER, high-sensitivity troponin 762, 593.  Blood pressure was severely elevated.  He was started on IV heparin and admitted.  Left heart cath on 11/13/2022 showed severe three-vessel CAD, and patient was transferred to [facility] for CABG consult.   Past Medical History:  Diagnosis Date   GERD (gastroesophageal reflux disease)    no meds   Hypertension    No pertinent past medical history     Past Surgical History:  Procedure Laterality Date   CIRCUMCISION     EXCISION MASS NECK Right 06/12/2019   Procedure: EXCISION Superficial MASS NECK;  Surgeon: Duanne Guess, MD;  Location: [facility] ORS;  Service: General;  Laterality: Right;   EXCISION OF SKIN TAG Bilateral 06/12/2019   Procedure: EXCISION OF SKIN TAG on neck;  Surgeon: Duanne Guess, MD;  Location: [facility] ORS;  Service: General;   Laterality: Bilateral;   SHOULDER SURGERY Right 2014     Medications Prior to Admission: Prior to Admission medications   Medication Sig Start Date End Date Taking? Authorizing Provider  lisinopril (ZESTRIL) 30 MG tablet Take 1 tablet (30 mg total) by mouth daily. 07/16/22  Yes Margaretann Loveless, PA-C  sildenafil (VIAGRA) 100 MG tablet Take 1 tablet (100 mg total) by mouth daily as needed for erectile dysfunction. 07/26/22  Yes White, Elita Boone, NP     Allergies:   No Known Allergies  Social History:   Social History   Socioeconomic History   Marital status: Single    Spouse name: Not on file   Number of children: Not on file   Years of education: Not on file   Highest education level: Not on file  Occupational History   Not on file  Tobacco Use   Smoking status: Never   Smokeless tobacco: Never  Vaping Use   Vaping status: Never Used  Substance and Sexual Activity   Alcohol use: No    Alcohol/week: 0.0 standard drinks of alcohol   Drug use: Never   Sexual activity: Yes  Other Topics Concern   Not on file  Social History Narrative   Not on file   Social Determinants of Health   Financial Resource Strain: Not on file  Food Insecurity: Not on file  Transportation Needs: Not on file  4. The aortic valve was not well visualized. Aortic valve regurgitation  is not visualized. No aortic stenosis is present.   Laboratory Data:  High Sensitivity Troponin:   Recent Labs  Lab 11/12/22 1809 11/12/22 2122  TROPONINIHS 762* 593*      Chemistry Recent Labs  Lab 11/12/22 1809  NA 137  K 4.4  CL 103  CO2 27  GLUCOSE 91  BUN 15  CREATININE 1.15  CALCIUM 9.3  GFRNONAA >60  ANIONGAP 7    Recent Labs  Lab 11/12/22 2000  PROT 7.9  ALBUMIN 4.0  AST 23  ALT 34  ALKPHOS 50  BILITOT 0.9   Lipids  Recent Labs  Lab 11/13/22 0503  CHOL 179  TRIG 203*  HDL 35*  LDLCALC 103*  CHOLHDL 5.1   Hematology Recent Labs  Lab 11/12/22 1809 11/13/22 0503  WBC 7.2 6.2  RBC 4.99 4.82  HGB 12.9* 12.4*  HCT 40.9 39.2  MCV 82.0 81.3  MCH 25.9* 25.7*  MCHC 31.5 31.6  RDW 14.0 13.6  PLT 220 215   Thyroid  Recent Labs  Lab 11/12/22 2122  TSH 1.991  FREET4 0.81   BNPNo results for input(s): "BNP", "PROBNP" in the last 168 hours.  DDimer  Recent Labs  Lab 11/12/22 2029  DDIMER 0.28     Radiology/Studies:  ECHOCARDIOGRAM COMPLETE  Result Date: 11/13/2022    ECHOCARDIOGRAM REPORT   Patient Name:   Alexander Simon Toth Date of Exam: 11/13/2022 Medical Rec #:  [id number]      Height:       72.0 in Accession #:    [id number]     Weight:       289.0 lb Date of Birth:   Jan 07, 1971      BSA:          2.491 m Patient Age:    52 years       BP:           142/81 mmHg Patient Gender: M              HR:           72 bpm. Exam Location:  [facility] Procedure: 2D Echo, Cardiac Doppler and Color Doppler Indications:     Chest pain R07.9  History:         Patient has no prior history of Echocardiogram examinations.                  Risk Factors:Hypertension.  Sonographer:     Cristela Blue Referring Phys:  [id number] Nevelyn Mellott H Kirbie Stodghill Diagnosing Phys: Yvonne Kendall MD IMPRESSIONS  1. Left ventricular ejection fraction, by estimation, is 45 to 50%. The left ventricle has mildly decreased function. The left ventricle demonstrates regional wall motion abnormalities (see scoring diagram/findings for description). There is moderate left ventricular hypertrophy. Left ventricular diastolic parameters are consistent with Grade II diastolic dysfunction (pseudonormalization). Elevated left atrial pressure. There is severe hypokinesis of the left ventricular, basal-mid inferior wall and inferolateral wall.  2. Right ventricular systolic function is normal. The right ventricular size is normal. Tricuspid regurgitation signal is inadequate for assessing PA pressure.  3. The mitral valve is degenerative. Trivial mitral valve regurgitation. No evidence of mitral stenosis.  4. The aortic valve was not well visualized. Aortic valve regurgitation is not visualized. No aortic stenosis is present. FINDINGS  Left Ventricle: Left ventricular ejection fraction, by estimation, is 45 to 50%. The left ventricle has mildly decreased function. The left ventricle demonstrates  Cardiology Admission History and Physical   Patient ID: KHADEEM PAOLA MRN: [id number]; DOB: Sep 26, 1970   Admission date: 11/12/2022  PCP:  Oneita Hurt, No    HeartCare Providers Cardiologist: New  Chief Complaint: Chest pain  Patient Profile:   Alexander Simon is a 52 y.o. male with history of hypertension, obesity, prediabetes who is being seen 11/13/2022 for the evaluation of non-STEMI.  History of Present Illness:   Mr. Makowski has no prior history of MI.  No alcohol, drug, or tobacco use.  He takes lisinopril for blood pressure.  He does not have a PCP and has not been seen in years.  Patient presented to the ER at [facility] 11/12/2022 with chest pain.  Symptoms started Friday with chest pain, numbness in the right arm and shortness of breath.  After he got home from a restaurant that night he started having nausea.  Chest pain was worse when he walked.  Over the weekend he felt okay.  Blood pressure was normal.  Monday, he went to work and started having similar symptoms and went to an urgent care.  At the urgent care, blood pressure was high and troponin was elevated and he was sent to the ER.  The ER, high-sensitivity troponin 762, 593.  Blood pressure was severely elevated.  He was started on IV heparin and admitted.  Left heart cath on 11/13/2022 showed severe three-vessel CAD, and patient was transferred to [facility] for CABG consult.   Past Medical History:  Diagnosis Date   GERD (gastroesophageal reflux disease)    no meds   Hypertension    No pertinent past medical history     Past Surgical History:  Procedure Laterality Date   CIRCUMCISION     EXCISION MASS NECK Right 06/12/2019   Procedure: EXCISION Superficial MASS NECK;  Surgeon: Duanne Guess, MD;  Location: [facility] ORS;  Service: General;  Laterality: Right;   EXCISION OF SKIN TAG Bilateral 06/12/2019   Procedure: EXCISION OF SKIN TAG on neck;  Surgeon: Duanne Guess, MD;  Location: [facility] ORS;  Service: General;   Laterality: Bilateral;   SHOULDER SURGERY Right 2014     Medications Prior to Admission: Prior to Admission medications   Medication Sig Start Date End Date Taking? Authorizing Provider  lisinopril (ZESTRIL) 30 MG tablet Take 1 tablet (30 mg total) by mouth daily. 07/16/22  Yes Margaretann Loveless, PA-C  sildenafil (VIAGRA) 100 MG tablet Take 1 tablet (100 mg total) by mouth daily as needed for erectile dysfunction. 07/26/22  Yes White, Elita Boone, NP     Allergies:   No Known Allergies  Social History:   Social History   Socioeconomic History   Marital status: Single    Spouse name: Not on file   Number of children: Not on file   Years of education: Not on file   Highest education level: Not on file  Occupational History   Not on file  Tobacco Use   Smoking status: Never   Smokeless tobacco: Never  Vaping Use   Vaping status: Never Used  Substance and Sexual Activity   Alcohol use: No    Alcohol/week: 0.0 standard drinks of alcohol   Drug use: Never   Sexual activity: Yes  Other Topics Concern   Not on file  Social History Narrative   Not on file   Social Determinants of Health   Financial Resource Strain: Not on file  Food Insecurity: Not on file  Transportation Needs: Not on file

## 2022-11-13 NOTE — Interval H&P Note (Signed)
History and Physical Interval Note:  11/13/2022 3:43 PM  Alexander Simon  has presented today for surgery, with the diagnosis of NSTEMI.  The various methods of treatment have been discussed with the patient and family. After consideration of risks, benefits and other options for treatment, the patient has consented to  Procedure(s): LEFT HEART CATH AND CORONARY ANGIOGRAPHY (N/A) as a surgical intervention.  The patient's history has been reviewed, patient examined, no change in status, stable for surgery.  I have reviewed the patient's chart and labs.  Questions were answered to the patient's satisfaction.    Cath Lab Visit (complete for each Cath Lab visit)  Clinical Evaluation Leading to the Procedure:   ACS: Yes.    Non-ACS:  N/A  Brian Zeitlin

## 2022-11-13 NOTE — H&P (View-Only) (Signed)
Cardiology Consultation   Patient ID: Alexander Simon MRN: [id number]; DOB: March 10, 1970  Admit date: 11/12/2022 Date of Consult: 11/13/2022  PCP:  Oneita Hurt No   [facility] Providers Cardiologist:  New   Patient Profile:   Alexander Simon is a 52 y.o. male with a hx of hypertension, obesity, prediabetic who is being seen 11/13/2022 for the evaluation of chest pain and elevated troponin at the request of Dr. Myriam Forehand.  History of Present Illness:   Alexander Simon was seen in 2019 by Dr. Mariah Milling for atypical chest pain in the setting of coughing and severely elevated BP. Troponin was negative x 3. Lisinopril was increased.    No history of MI. No alcohol, drug, or tobacco use. He takes Lisinopril for BP. He does not have PCP, has been year.   Patient presented to the ER 11/12/2022 for chest pain.Symptoms Friday: chest pain, numbness in right arm, SOB. After he got home from a restaurant he started having nausea. Chest pain was worse when he walked. He thought it was indigestion. Pain got more severe. Over the weekend he felt OK. BP was normal and he went to work Saturday. He felt sore in his chest area. Monday, he went to work and he went to an Urgent care. At urgent care BP was high and troponin was elevated and he was sent to the ER.   In the ER blood pressure 197/104, pulse rate 89 bpm, respiratory rate 18, afebrile, 100% O2.  High-sensitivity troponin 762>593.  Chest x-ray unremarkable.  D-dimer normal.  EKG shows normal sinus rhythm, anterior inferior T wave inversion/normal ST changes.The patient was started on IV heparin and admitted for further work-up.    Past Medical History:  Diagnosis Date   GERD (gastroesophageal reflux disease)    no meds   Hypertension    No pertinent past medical history     Past Surgical History:  Procedure Laterality Date   CIRCUMCISION     EXCISION MASS NECK Right 06/12/2019   Procedure: EXCISION Superficial MASS NECK;  Surgeon: Duanne Guess, MD;   Location: [facility] ORS;  Service: General;  Laterality: Right;   EXCISION OF SKIN TAG Bilateral 06/12/2019   Procedure: EXCISION OF SKIN TAG on neck;  Surgeon: Duanne Guess, MD;  Location: [facility] ORS;  Service: General;  Laterality: Bilateral;   SHOULDER SURGERY Right 2014     Home Medications:  Prior to Admission medications   Medication Sig Start Date End Date Taking? Authorizing Provider  lisinopril (ZESTRIL) 30 MG tablet Take 1 tablet (30 mg total) by mouth daily. 07/16/22  Yes Margaretann Loveless, PA-C  sildenafil (VIAGRA) 100 MG tablet Take 1 tablet (100 mg total) by mouth daily as needed for erectile dysfunction. 07/26/22  Yes Valinda Hoar, NP    Inpatient Medications: Scheduled Meds:  aspirin EC  81 mg Oral Daily   atorvastatin  40 mg Oral q1800   lisinopril  30 mg Oral Daily   metoprolol tartrate  12.5 mg Oral BID   sodium chloride flush  3 mL Intravenous Q12H   Continuous Infusions:  sodium chloride     heparin 1,700 Units/hr (11/13/22 0726)   PRN Meds: sodium chloride, acetaminophen, nitroGLYCERIN, ondansetron (ZOFRAN) IV, sodium chloride flush  Allergies:   No Known Allergies  Social History:   Social History   Socioeconomic History   Marital status: Single    Spouse name: Not on file   Number of children: Not on file   Years of education:  Not on file   Highest education level: Not on file  Occupational History   Not on file  Tobacco Use   Smoking status: Never   Smokeless tobacco: Never  Vaping Use   Vaping status: Never Used  Substance and Sexual Activity   Alcohol use: No    Alcohol/week: 0.0 standard drinks of alcohol   Drug use: Never   Sexual activity: Yes  Other Topics Concern   Not on file  Social History Narrative   Not on file   Social Determinants of Health   Financial Resource Strain: Not on file  Food Insecurity: Not on file  Transportation Needs: Not on file  Physical Activity: Not on file  Stress: Not on file  Social  Connections: Not on file  Intimate Partner Violence: Not on file    Family History:    Family History  Problem Relation Age of Onset   Breast cancer Mother    Lung cancer Mother    Cancer Maternal Uncle      ROS:  Please see the history of present illness.   All other ROS reviewed and negative.     Physical Exam/Data:   Vitals:   11/13/22 0415 11/13/22 0428 11/13/22 0430 11/13/22 0630  BP:   130/71 125/79  Pulse: 65  65 70  Resp: 20  18 16   Temp:  98.3 F (36.8 C)    TempSrc:  Oral    SpO2: 95%  96% 99%  Weight:      Height:       No intake or output data in the 24 hours ending 11/13/22 0737    11/12/2022    7:49 PM 07/26/2022    6:05 PM 12/10/2021   12:07 PM  Last 3 Weights  Weight (lbs) 289 lb 279 lb 282 lb  Weight (kg) 131.09 kg 126.554 kg 127.914 kg     Body mass index is 39.2 kg/m.  General:  Well nourished, well developed, in no acute distress HEENT: normal Neck: no JVD Vascular: No carotid bruits; Distal pulses 2+ bilaterally Cardiac:  normal S1, S2; RRR; no murmur  Lungs:  clear to auscultation bilaterally, no wheezing, rhonchi or rales  Abd: soft, nontender, no hepatomegaly  Ext: no edema Musculoskeletal:  No deformities, BUE and BLE strength normal and equal Skin: warm and dry  Neuro:  CNs 2-12 intact, no focal abnormalities noted Psych:  Normal affect   EKG:  The EKG was personally reviewed and demonstrates:  NSR TWI anterolateral leads with minimal ST changes (no change) Telemetry:  Telemetry was personally reviewed and demonstrates:  NSR, HR 60-70s, PVCs  Relevant CV Studies:  Echo ordered  Laboratory Data:  High Sensitivity Troponin:   Recent Labs  Lab 11/12/22 1809 11/12/22 2122  TROPONINIHS 762* 593*     Chemistry Recent Labs  Lab 11/12/22 1809  NA 137  K 4.4  CL 103  CO2 27  GLUCOSE 91  BUN 15  CREATININE 1.15  CALCIUM 9.3  GFRNONAA >60  ANIONGAP 7    Recent Labs  Lab 11/12/22 2000  PROT 7.9  ALBUMIN 4.0  AST  23  ALT 34  ALKPHOS 50  BILITOT 0.9   Lipids  Recent Labs  Lab 11/13/22 0503  CHOL 179  TRIG 203*  HDL 35*  LDLCALC 103*  CHOLHDL 5.1    Hematology Recent Labs  Lab 11/12/22 1809 11/13/22 0503  WBC 7.2 6.2  RBC 4.99 4.82  HGB 12.9* 12.4*  HCT 40.9 39.2  MCV  82.0 81.3  MCH 25.9* 25.7*  MCHC 31.5 31.6  RDW 14.0 13.6  PLT 220 215   Thyroid  Recent Labs  Lab 11/12/22 2122  TSH 1.991  FREET4 0.81    BNPNo results for input(s): "BNP", "PROBNP" in the last 168 hours.  DDimer  Recent Labs  Lab 11/12/22 2029  DDIMER 0.28     Radiology/Studies:  DG Chest Port 1 View  Result Date: 11/12/2022 CLINICAL DATA:  Chest pain on the left for 3 days, initial encounter EXAM: PORTABLE CHEST 1 VIEW COMPARISON:  None Available. FINDINGS: The heart size and mediastinal contours are within normal limits. Both lungs are clear. The visualized skeletal structures are unremarkable. IMPRESSION: No active disease. Electronically Signed   By: Alcide Clever M.D.   On: 11/12/2022 21:31     Assessment and Plan:   Chest pain NSTEMI - patient presented with chest pain found to have elevated troponin and elevated blood pressure - HS troponin 762>593 - EKG with no changes - continue IV heparin - check echo - continue Aspirin 81mg  daily, Lipitor 40mg  daily, Lopressor 12.5mg  BID and Lisinopril 30mg  daily - plan for cardiac cath Risks and benefits of cardiac catheterization have been discussed with the patient.  These include bleeding, infection, kidney damage, stroke, heart attack, death.  The patient understands these risks and is willing to proceed.   HTN - PTA lisinopril - elevated on admission - lisinopril 30mg  daily and Lopressor 12.5mg  BID - BP better  HLD - LDL 103 - continue Lipitor 40mg  daily   For questions or updates, please contact [facility] Please consult [email or web address] for contact info under    Signed, Arra Connaughton David Stall, PA-C  11/13/2022 7:37 AM

## 2022-11-13 NOTE — Progress Notes (Signed)
Progress Note    Alexander Simon  ZOX:[id number] DOB: 12-23-1970  DOA: 11/12/2022 PCP: Pcp, No      Brief Narrative:    Medical records reviewed and are as summarized below:  Alexander Simon is a 52 y.o. male with medical history significant for hypertension, obesity, who presented to the hospital for evaluation for chest pain that occurred about 4 days prior to admission.  He had developed chest pain while he was walking out of a medical a restaurant last Friday evening but he thought it was indigestion.  Pain got worse with exertion.  His chest pain improved but he decided to visit the urgent care clinic on Monday (day prior to admission) for further evaluation.  At the urgent care clinic, he was found to have elevated troponins and he was referred to the emergency department for further management.   He was admitted to the hospital for acute NSTEMI.  He was treated with IV heparin drip.     Assessment/Plan:   Principal Problem:   Chest pain Active Problems:   NSTEMI (non-ST elevated myocardial infarction) (HCC)   Hypertension   Obesity (BMI 30-39.9)   Prediabetes   Body mass index is 39.2 kg/m.  (Obesity)   Acute NSTEMI: Continue IV heparin drip.  Monitor heparin level per protocol.  Continue aspirin, Lipitor and metoprolol.  He has been evaluated by the cardiologist with plan for left heart catheterization tomorrow.    Hypertensive urgency: BP is better.  Continue lisinopril (he takes lisinopril at home) and metoprolol.   Dyslipidemia: LDL 103, HDL 35, triglycerides 203.  Continue Lipitor.   Prediabetes: Hemoglobin A1c is pending.     Diet Order             Diet NPO time specified  Diet effective midnight                            Consultants: Cardiologist  Procedures: None    Medications:    aspirin EC  81 mg Oral Daily   atorvastatin  40 mg Oral q1800   lisinopril  30 mg Oral Daily   metoprolol tartrate  12.5 mg Oral BID    sodium chloride flush  3 mL Intravenous Q12H   Continuous Infusions:  sodium chloride     heparin 1,700 Units/hr (11/13/22 0726)     Anti-infectives (From admission, onward)    None              Family Communication/Anticipated D/C date and plan/Code Status   DVT prophylaxis:      Code Status: Full Code  Family Communication: None Disposition Plan: Plan to discharge home tomorrow   Status is: Inpatient Remains inpatient appropriate because: NSTEMI       Subjective:   Interval events noted.  No chest pain, shortness of breath, dizziness or palpitations.  He feels okay at this time.  Objective:    Vitals:   11/13/22 0630 11/13/22 0800 11/13/22 0900 11/13/22 1006  BP: 125/79 (!) 142/81 (!) 149/84 (!) 144/81  Pulse: 70 72 74 73  Resp: 16 (!) 21 15 18   Temp:    98.1 F (36.7 C)  TempSrc:    Oral  SpO2: 99% 100% 99% 100%  Weight:      Height:       No data found.   Intake/Output Summary (Last 24 hours) at 11/13/2022 1222 Last data filed at 11/13/2022 1011 Gross per 24  hour  Intake 3 ml  Output --  Net 3 ml   Filed Weights   11/12/22 1949  Weight: 131.1 kg    Exam:   GEN: NAD SKIN: No rash EYES: EOMI ENT: MMM CV: RRR PULM: CTA B ABD: soft, obese, NT, +BS CNS: AAO x 3, non focal EXT: No edema or tenderness       Data Reviewed:   I have personally reviewed following labs and imaging studies:  Labs: Labs show the following:   Basic Metabolic Panel: Recent Labs  Lab 11/12/22 1809  NA 137  K 4.4  CL 103  CO2 27  GLUCOSE 91  BUN 15  CREATININE 1.15  CALCIUM 9.3   GFR Estimated Creatinine Clearance: 105.2 mL/min (by C-G formula based on SCr of 1.15 mg/dL). Liver Function Tests: Recent Labs  Lab 11/12/22 2000  AST 23  ALT 34  ALKPHOS 50  BILITOT 0.9  PROT 7.9  ALBUMIN 4.0   No results for input(s): "LIPASE", "AMYLASE" in the last 168 hours. No results for input(s): "AMMONIA" in the last 168  hours. Coagulation profile Recent Labs  Lab 11/12/22 2000  INR 1.0    CBC: Recent Labs  Lab 11/12/22 1809 11/13/22 0503  WBC 7.2 6.2  NEUTROABS 3.8  --   HGB 12.9* 12.4*  HCT 40.9 39.2  MCV 82.0 81.3  PLT 220 215   Cardiac Enzymes: No results for input(s): "CKTOTAL", "CKMB", "CKMBINDEX", "TROPONINI" in the last 168 hours. BNP (last 3 results) No results for input(s): "PROBNP" in the last 8760 hours. CBG: No results for input(s): "GLUCAP" in the last 168 hours. D-Dimer: Recent Labs    11/12/22 2028-01-17  DDIMER 0.28   Hgb A1c: No results for input(s): "HGBA1C" in the last 72 hours. Lipid Profile: Recent Labs    11/13/22 0503  CHOL 179  HDL 35*  LDLCALC 103*  TRIG 203*  CHOLHDL 5.1   Thyroid function studies: Recent Labs    11/12/22 01-16-21  TSH 1.991   Anemia work up: No results for input(s): "VITAMINB12", "FOLATE", "FERRITIN", "TIBC", "IRON", "RETICCTPCT" in the last 72 hours. Sepsis Labs: Recent Labs  Lab 11/12/22 1809 11/13/22 0503  WBC 7.2 6.2    Microbiology No results found for this or any previous visit (from the past 240 hour(s)).  Procedures and diagnostic studies:  ECHOCARDIOGRAM COMPLETE  Result Date: 11/13/2022    ECHOCARDIOGRAM REPORT   Patient Name:   Alexander Simon Date of Exam: 11/13/2022 Medical Rec #:  [id number]      Height:       72.0 in Accession #:    [id number]     Weight:       289.0 lb Date of Birth:  05/26/1970      BSA:          2.491 m Patient Age:    51 years       BP:           142/81 mmHg Patient Gender: M              HR:           72 bpm. Exam Location:  [facility] Procedure: 2D Echo, Cardiac Doppler and Color Doppler Indications:     Chest pain R07.9  History:         Patient has no prior history of Echocardiogram examinations.                  Risk Factors:Hypertension.  Sonographer:  Cristela Blue Referring Phys:  [id number] CADENCE H FURTH Diagnosing Phys: Cristal Deer End MD IMPRESSIONS  1. Left ventricular ejection fraction, by  estimation, is 45 to 50%. The left ventricle has mildly decreased function. The left ventricle demonstrates regional wall motion abnormalities (see scoring diagram/findings for description). There is moderate left ventricular hypertrophy. Left ventricular diastolic parameters are consistent with Grade II diastolic dysfunction (pseudonormalization). Elevated left atrial pressure. There is severe hypokinesis of the left ventricular, basal-mid inferior wall and inferolateral wall.  2. Right ventricular systolic function is normal. The right ventricular size is normal. Tricuspid regurgitation signal is inadequate for assessing PA pressure.  3. The mitral valve is degenerative. Trivial mitral valve regurgitation. No evidence of mitral stenosis.  4. The aortic valve was not well visualized. Aortic valve regurgitation is not visualized. No aortic stenosis is present. FINDINGS  Left Ventricle: Left ventricular ejection fraction, by estimation, is 45 to 50%. The left ventricle has mildly decreased function. The left ventricle demonstrates regional wall motion abnormalities. Severe hypokinesis of the left ventricular, basal-mid inferior wall and inferolateral wall. The left ventricular internal cavity size was normal in size. There is moderate left ventricular hypertrophy. Left ventricular diastolic parameters are consistent with Grade II diastolic dysfunction (pseudonormalization). Elevated left atrial pressure. Right Ventricle: The right ventricular size is normal. Right vetricular wall thickness was not well visualized. Right ventricular systolic function is normal. Tricuspid regurgitation signal is inadequate for assessing PA pressure. Left Atrium: Left atrial size was normal in size. Right Atrium: Right atrial size was normal in size. Pericardium: The pericardium was not well visualized. Mitral Valve: The mitral valve is degenerative in appearance. There is mild thickening of the mitral valve leaflet(s). Trivial mitral  valve regurgitation. No evidence of mitral valve stenosis. MV peak gradient, 4.8 mmHg. The mean mitral valve gradient is  1.0 mmHg. Tricuspid Valve: The tricuspid valve is not well visualized. Tricuspid valve regurgitation is trivial. Aortic Valve: The aortic valve was not well visualized. Aortic valve regurgitation is not visualized. No aortic stenosis is present. Aortic valve mean gradient measures 2.0 mmHg. Aortic valve peak gradient measures 2.8 mmHg. Aortic valve area, by VTI measures 3.85 cm. Pulmonic Valve: The pulmonic valve was not well visualized. Pulmonic valve regurgitation is not visualized. No evidence of pulmonic stenosis. Aorta: The aortic root is normal in size and structure. Pulmonary Artery: The pulmonary artery is not well seen. IAS/Shunts: The interatrial septum was not well visualized.  LEFT VENTRICLE PLAX 2D LVIDd:         5.30 cm   Diastology LVIDs:         3.60 cm   LV e' medial:    5.22 cm/s LV PW:         1.40 cm   LV E/e' medial:  16.1 LV IVS:        1.30 cm   LV e' lateral:   6.42 cm/s LVOT diam:     2.10 cm   LV E/e' lateral: 13.1 LV SV:         52 LV SV Index:   21 LVOT Area:     3.46 cm  RIGHT VENTRICLE RV Basal diam:  3.30 cm RV Mid diam:    2.80 cm LEFT ATRIUM             Index        RIGHT ATRIUM           Index LA diam:        3.80 cm 1.53  cm/m   RA Area:     13.40 cm LA Vol (A2C):   40.2 ml 16.14 ml/m  RA Volume:   30.90 ml  12.40 ml/m LA Vol (A4C):   30.4 ml 12.20 ml/m LA Biplane Vol: 35.4 ml 14.21 ml/m  AORTIC VALVE AV Area (Vmax):    3.16 cm AV Area (Vmean):   3.07 cm AV Area (VTI):     3.85 cm AV Vmax:           83.10 cm/s AV Vmean:          58.800 cm/s AV VTI:            0.136 m AV Peak Grad:      2.8 mmHg AV Mean Grad:      2.0 mmHg LVOT Vmax:         75.80 cm/s LVOT Vmean:        52.100 cm/s LVOT VTI:          0.151 m LVOT/AV VTI ratio: 1.11  AORTA Ao Root diam: 3.00 cm MITRAL VALVE MV Area (PHT): 6.07 cm    SHUNTS MV Area VTI:   2.44 cm    Systemic VTI:  0.15  m MV Peak grad:  4.8 mmHg    Systemic Diam: 2.10 cm MV Mean grad:  1.0 mmHg MV Vmax:       1.09 m/s MV Vmean:      51.1 cm/s MV Decel Time: 125 msec MV E velocity: 83.80 cm/s MV A velocity: 48.20 cm/s MV E/A ratio:  1.74 Cristal Deer End MD Electronically signed by Yvonne Kendall MD Signature Date/Time: 11/13/2022/9:47:11 AM    Final    DG Chest Port 1 View  Result Date: 11/12/2022 CLINICAL DATA:  Chest pain on the left for 3 days, initial encounter EXAM: PORTABLE CHEST 1 VIEW COMPARISON:  None Available. FINDINGS: The heart size and mediastinal contours are within normal limits. Both lungs are clear. The visualized skeletal structures are unremarkable. IMPRESSION: No active disease. Electronically Signed   By: Alcide Clever M.D.   On: 11/12/2022 21:31               LOS: 1 day   Alexander Simon  [facility]   Pager on [email or web address]. If 7PM-7AM, please contact night-coverage at [email or web address]     11/13/2022, 12:22 PM

## 2022-11-13 NOTE — ED Notes (Signed)
Pt updated that he is to have clear liquids only at this time, as they are unsure of when he will go to the cath lab.

## 2022-11-13 NOTE — Progress Notes (Signed)
Pt. Med. With Tylenol 650 mg p.o. for c/o severe HA. Right radial site clean, dry, intact without hematoma, drainage, ecchymosis.

## 2022-11-13 NOTE — ED Notes (Signed)
Hospitalist and cardio messaged in regard to pt diet order

## 2022-11-13 NOTE — Progress Notes (Signed)
Dr. Okey Dupre at bedside with pt., speaking with pt. Re: cath results. Pt. Verbalized understanding of conversation and need for CABG.

## 2022-11-13 NOTE — Progress Notes (Signed)
ANTICOAGULATION CONSULT NOTE  Pharmacy Consult for heparin infusion Indication: chest pain/ACS  No Known Allergies  Patient Measurements: Height: 6' (182.9 cm) Weight: 131.1 kg (289 lb) IBW/kg (Calculated) : 77.6 Heparin Dosing Weight: 107.2 kg  Vital Signs: Temp: 98.1 F (36.7 C) (09/17 1006) Temp Source: Oral (09/17 1006) BP: 144/81 (09/17 1006) Pulse Rate: 73 (09/17 1006)  Labs: Recent Labs    11/12/22 1809 11/12/22 2000 11/12/22 2122 11/13/22 0503 11/13/22 1300  HGB 12.9*  --   --  12.4*  --   HCT 40.9  --   --  39.2  --   PLT 220  --   --  215  --   APTT  --  28  --   --   --   LABPROT  --  13.4  --   --   --   INR  --  1.0  --   --   --   HEPARINUNFRC  --   --   --  0.27* 0.36  CREATININE 1.15  --   --   --   --   TROPONINIHS 762*  --  593*  --   --     Estimated Creatinine Clearance: 105.2 mL/min (by C-G formula based on SCr of 1.15 mg/dL).   Medical History: Past Medical History:  Diagnosis Date   GERD (gastroesophageal reflux disease)    no meds   Hypertension    No pertinent past medical history     Assessment: 52 y.o. male w/ PMH of HTN, GERD presents with chest pain. A review of medical records reveals no chronic anticoagulation prior to arrival. cTn trending down from 762 to 593 and ECG from admission shows borderline ST elevation and demonstrates sinus rhythm but a repolarization abnormality suggestive of ischemia. 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 Patient likely going to receive a heart cath.  Pharmacy following for heparin infusion consult. Heparin drip was started 9/16 @ ~2000.  Goal of Therapy:  Heparin level 0.3-0.7 units/ml Monitor platelets by anticoagulation protocol: Yes  Plan: Date Time aPTT/HL Rate/Comment 0917 0503 0.27  Subtherapeutic 0917 1300 0.36  Therapeutic x 1     PLAN: Continue heparin infusion at 1700 units/hr Check confirmatory HL in 6 hours Monitor H&H daily while on heparin  infusion  Will M. Dareen Piano, PharmD Clinical Pharmacist 11/13/2022 1:40 PM

## 2022-11-14 ENCOUNTER — Inpatient Hospital Stay (HOSPITAL_COMMUNITY)
Admit: 2022-11-14 | Discharge: 2022-11-25 | DRG: 233 | Disposition: A | Discharge status: Home / Self Care | Payer: Commercial Managed Care - PPO | Source: Other Acute Inpatient Hospital | Attending: Thoracic Surgery (Cardiothoracic Vascular Surgery) | Admitting: Thoracic Surgery (Cardiothoracic Vascular Surgery)

## 2022-11-14 ENCOUNTER — Encounter: Payer: Self-pay | Admitting: Internal Medicine

## 2022-11-14 DIAGNOSIS — I10 Essential (primary) hypertension: Secondary | ICD-10-CM | POA: Diagnosis not present

## 2022-11-14 DIAGNOSIS — Z01810 Encounter for preprocedural cardiovascular examination: Secondary | ICD-10-CM | POA: Diagnosis not present

## 2022-11-14 DIAGNOSIS — Z7902 Long term (current) use of antithrombotics/antiplatelets: Secondary | ICD-10-CM | POA: Diagnosis not present

## 2022-11-14 DIAGNOSIS — I110 Hypertensive heart disease with heart failure: Secondary | ICD-10-CM | POA: Diagnosis present

## 2022-11-14 DIAGNOSIS — I252 Old myocardial infarction: Secondary | ICD-10-CM | POA: Diagnosis present

## 2022-11-14 DIAGNOSIS — I5021 Acute systolic (congestive) heart failure: Secondary | ICD-10-CM | POA: Diagnosis present

## 2022-11-14 DIAGNOSIS — R7303 Prediabetes: Secondary | ICD-10-CM

## 2022-11-14 DIAGNOSIS — I2510 Atherosclerotic heart disease of native coronary artery without angina pectoris: Secondary | ICD-10-CM | POA: Diagnosis present

## 2022-11-14 DIAGNOSIS — M25562 Pain in left knee: Secondary | ICD-10-CM | POA: Diagnosis present

## 2022-11-14 DIAGNOSIS — Z9889 Other specified postprocedural states: Secondary | ICD-10-CM

## 2022-11-14 DIAGNOSIS — I25118 Atherosclerotic heart disease of native coronary artery with other forms of angina pectoris: Secondary | ICD-10-CM | POA: Diagnosis not present

## 2022-11-14 DIAGNOSIS — E785 Hyperlipidemia, unspecified: Secondary | ICD-10-CM

## 2022-11-14 DIAGNOSIS — N179 Acute kidney failure, unspecified: Secondary | ICD-10-CM | POA: Diagnosis not present

## 2022-11-14 DIAGNOSIS — R079 Chest pain, unspecified: Principal | ICD-10-CM | POA: Diagnosis present

## 2022-11-14 DIAGNOSIS — J9811 Atelectasis: Secondary | ICD-10-CM | POA: Diagnosis not present

## 2022-11-14 DIAGNOSIS — Z6841 Body Mass Index (BMI) 40.0 and over, adult: Secondary | ICD-10-CM

## 2022-11-14 DIAGNOSIS — K219 Gastro-esophageal reflux disease without esophagitis: Secondary | ICD-10-CM | POA: Diagnosis present

## 2022-11-14 DIAGNOSIS — D6959 Other secondary thrombocytopenia: Secondary | ICD-10-CM | POA: Diagnosis present

## 2022-11-14 DIAGNOSIS — Z7984 Long term (current) use of oral hypoglycemic drugs: Secondary | ICD-10-CM

## 2022-11-14 DIAGNOSIS — D72829 Elevated white blood cell count, unspecified: Secondary | ICD-10-CM | POA: Diagnosis not present

## 2022-11-14 DIAGNOSIS — R0902 Hypoxemia: Secondary | ICD-10-CM | POA: Diagnosis not present

## 2022-11-14 DIAGNOSIS — Z801 Family history of malignant neoplasm of trachea, bronchus and lung: Secondary | ICD-10-CM

## 2022-11-14 DIAGNOSIS — Z809 Family history of malignant neoplasm, unspecified: Secondary | ICD-10-CM

## 2022-11-14 DIAGNOSIS — E871 Hypo-osmolality and hyponatremia: Secondary | ICD-10-CM | POA: Diagnosis not present

## 2022-11-14 DIAGNOSIS — I259 Chronic ischemic heart disease, unspecified: Secondary | ICD-10-CM | POA: Diagnosis not present

## 2022-11-14 DIAGNOSIS — Z79899 Other long term (current) drug therapy: Secondary | ICD-10-CM

## 2022-11-14 DIAGNOSIS — Z951 Presence of aortocoronary bypass graft: Secondary | ICD-10-CM

## 2022-11-14 DIAGNOSIS — Z803 Family history of malignant neoplasm of breast: Secondary | ICD-10-CM | POA: Diagnosis not present

## 2022-11-14 DIAGNOSIS — Z7982 Long term (current) use of aspirin: Secondary | ICD-10-CM

## 2022-11-14 DIAGNOSIS — M1712 Unilateral primary osteoarthritis, left knee: Secondary | ICD-10-CM | POA: Diagnosis present

## 2022-11-14 DIAGNOSIS — I200 Unstable angina: Secondary | ICD-10-CM | POA: Diagnosis not present

## 2022-11-14 DIAGNOSIS — I214 Non-ST elevation (NSTEMI) myocardial infarction: Secondary | ICD-10-CM | POA: Diagnosis present

## 2022-11-14 DIAGNOSIS — I509 Heart failure, unspecified: Secondary | ICD-10-CM | POA: Diagnosis not present

## 2022-11-14 LAB — BASIC METABOLIC PANEL WITH GFR
Anion gap: 5 (ref 5–15)
BUN: 16 mg/dL (ref 6–20)
CO2: 28 mmol/L (ref 22–32)
Calcium: 9.1 mg/dL (ref 8.9–10.3)
Chloride: 106 mmol/L (ref 98–111)
Creatinine, Ser: 1.18 mg/dL (ref 0.61–1.24)
GFR, Estimated: >60 mL/min (ref >60)
Glucose, Bld: 104 mg/dL — ABNORMAL HIGH (ref 70–99)
Potassium: 4.1 mmol/L (ref 3.5–5.1)
Sodium: 139 mmol/L (ref 135–145)

## 2022-11-14 LAB — HEPARIN LEVEL (UNFRACTIONATED)
Heparin Unfractionated: 0.77 [IU]/mL — ABNORMAL HIGH (ref 0.30–0.70)
Heparin Unfractionated: 0.36 [IU]/mL (ref 0.30–0.70)

## 2022-11-14 MED ORDER — HEPARIN (PORCINE) 25000 UT/250ML-% IV SOLN
2000.0000 [IU]/h | INTRAVENOUS | Status: DC
  Administered 2022-11-15: 1950 [IU]/h via INTRAVENOUS
  Administered 2022-11-15: 1900 [IU]/h via INTRAVENOUS
  Administered 2022-11-16 – 2022-11-17 (×2): 1950 [IU]/h via INTRAVENOUS
  Administered 2022-11-18 – 2022-11-19 (×2): 2000 [IU]/h via INTRAVENOUS
  Filled 2022-11-14 (×8): qty 250

## 2022-11-14 MED ORDER — SODIUM CHLORIDE 0.9 % IV SOLN: 250.0000 mL | INTRAVENOUS | Status: DC | PRN

## 2022-11-14 MED ORDER — ASPIRIN 300 MG RE SUPP
300.0000 mg | RECTAL | Status: DC
  Filled 2022-11-14: qty 1

## 2022-11-14 MED ORDER — HEPARIN (PORCINE) 25000 UT/250ML-% IV SOLN: 1850.0000 [IU]/h | INTRAVENOUS | Status: DC

## 2022-11-14 MED ORDER — HEPARIN BOLUS VIA INFUSION
3200.0000 [IU] | Freq: Once | INTRAVENOUS | Status: AC
  Administered 2022-11-14: 3200 [IU] via INTRAVENOUS
  Filled 2022-11-14: qty 3200

## 2022-11-14 MED ORDER — LISINOPRIL 30 MG PO TABS: 30.0000 mg | ORAL_TABLET | Freq: Every day | ORAL | Status: DC

## 2022-11-14 MED ORDER — ASPIRIN 81 MG PO TBEC: 81.0000 mg | DELAYED_RELEASE_TABLET | Freq: Every day | ORAL | Status: DC

## 2022-11-14 MED ORDER — SODIUM CHLORIDE 0.9% FLUSH: 3.0000 mL | INTRAVENOUS | Status: DC | PRN

## 2022-11-14 MED ORDER — METOPROLOL TARTRATE 25 MG PO TABS: 25.0000 mg | ORAL_TABLET | Freq: Two times a day (BID) | ORAL | Status: DC

## 2022-11-14 MED ORDER — ATORVASTATIN CALCIUM 80 MG PO TABS: 80.0000 mg | ORAL_TABLET | Freq: Every day | ORAL | Status: DC

## 2022-11-14 MED ORDER — NITROGLYCERIN 0.4 MG SL SUBL: 0.4000 mg | SUBLINGUAL_TABLET | SUBLINGUAL | Status: DC | PRN

## 2022-11-14 MED ORDER — ASPIRIN 81 MG PO TBEC
81.0000 mg | DELAYED_RELEASE_TABLET | Freq: Every day | ORAL | Status: DC
  Administered 2022-11-15 – 2022-11-18 (×4): 81 mg via ORAL
  Filled 2022-11-14 (×4): qty 1

## 2022-11-14 MED ORDER — ATORVASTATIN CALCIUM 80 MG PO TABS
80.0000 mg | ORAL_TABLET | Freq: Every day | ORAL | Status: DC
  Administered 2022-11-15 – 2022-11-25 (×10): 80 mg via ORAL
  Filled 2022-11-14 (×10): qty 1

## 2022-11-14 MED ORDER — ASPIRIN 81 MG PO CHEW: 324.0000 mg | CHEWABLE_TABLET | ORAL | Status: DC

## 2022-11-14 MED ORDER — ONDANSETRON HCL 4 MG/2ML IJ SOLN: 4.0000 mg | Freq: Four times a day (QID) | INTRAMUSCULAR | Status: DC | PRN

## 2022-11-14 MED ORDER — ATORVASTATIN CALCIUM 80 MG PO TABS
80.0000 mg | ORAL_TABLET | Freq: Every day | ORAL | Status: DC
  Administered 2022-11-14: 80 mg via ORAL
  Filled 2022-11-14: qty 1

## 2022-11-14 MED ORDER — METOPROLOL TARTRATE 25 MG PO TABS
25.0000 mg | ORAL_TABLET | Freq: Two times a day (BID) | ORAL | Status: DC
  Administered 2022-11-14 – 2022-11-15 (×2): 25 mg via ORAL
  Filled 2022-11-14 (×2): qty 1

## 2022-11-14 MED ORDER — SODIUM CHLORIDE 0.9% FLUSH
3.0000 mL | Freq: Two times a day (BID) | INTRAVENOUS | Status: DC
  Administered 2022-11-14 – 2022-11-18 (×4): 3 mL via INTRAVENOUS

## 2022-11-14 MED ORDER — ACETAMINOPHEN 325 MG PO TABS
650.0000 mg | ORAL_TABLET | ORAL | Status: DC | PRN
  Administered 2022-11-14: 650 mg via ORAL
  Filled 2022-11-14: qty 2

## 2022-11-14 NOTE — Progress Notes (Signed)
ANTICOAGULATION CONSULT NOTE  Pharmacy Consult for heparin infusion Indication: chest pain/ACS  No Known Allergies  Patient Measurements: Height: 6' (182.9 cm) Weight: 132 kg (291 lb) IBW/kg (Calculated) : 77.6 Heparin Dosing Weight: 107.2 kg  Vital Signs: Temp: 98.6 F (37 C) (09/18 1107) Temp Source: Oral (09/18 1107) BP: 188/90 (09/18 1107) Pulse Rate: 81 (09/18 1107)  Labs: Recent Labs    11/12/22 1809 11/12/22 1809 11/12/22 2000 11/12/22 2122 11/13/22 0503 11/13/22 1300 11/14/22 0318 11/14/22 0955  HGB 12.9*  --   --   --  12.4*  --  11.8*  --   HCT 40.9  --   --   --  39.2  --  37.5*  --   PLT 220  --   --   --  215  --  221  --   APTT  --   --  28  --   --   --   --   --   LABPROT  --   --  13.4  --   --   --   --   --   INR  --   --  1.0  --   --   --   --   --   HEPARINUNFRC  --    < >  --   --  0.27* 0.36 0.17* 0.77*  CREATININE 1.15  --   --   --   --   --   --   --   TROPONINIHS 762*  --   --  593*  --   --   --   --    < > = values in this interval not displayed.    Estimated Creatinine Clearance: 105.6 mL/min (by C-G formula based on SCr of 1.15 mg/dL).   Medical History: Past Medical History:  Diagnosis Date   GERD (gastroesophageal reflux disease)    no meds   Hypertension    No pertinent past medical history     Assessment: 52 y.o. male w/ PMH of HTN, GERD presents with chest pain. A review of medical records reveals no chronic anticoagulation prior to arrival. cTn trending down from 762 to 593 and ECG from admission shows borderline ST elevation and demonstrates sinus rhythm but a repolarization abnormality suggestive of ischemia. 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    Goal of Therapy:  Heparin level 0.3-0.7 units/ml Monitor platelets by anticoagulation protocol: Yes  Plan: Date Time aPTT/HL Rate/Comment 0917 0503 0.27  Subtherapeutic 0917 1300 0.36  Therapeutic x  1 0918 0318 0.17  Subtherapeutic 0918 0955 0.77  Supratherapeutic     PLAN: Heparin level is supratherapeutic. Will decrease heparin infusion to 1850 units/hr. Recheck heparin level in 6 hrs. CBC daily while on heparin. Plan to transfer to [facility].   Paschal Dopp, PharmD, BCPS 11/14/2022 11:40 AM

## 2022-11-14 NOTE — Progress Notes (Signed)
ANTICOAGULATION CONSULT NOTE  Pharmacy Consult for heparin infusion Indication: chest pain/ACS  No Known Allergies  Patient Measurements: Height: 6' (182.9 cm) Weight: 132 kg (291 lb) IBW/kg (Calculated) : 77.6 Heparin Dosing Weight: 107.2 kg  Vital Signs: Temp: 98.1 F (36.7 C) (09/18 0105) BP: 141/67 (09/18 0105) Pulse Rate: 72 (09/18 0105)  Labs: Recent Labs    11/12/22 1809 11/12/22 2000 11/12/22 2122 11/13/22 0503 11/13/22 1300 11/14/22 0318  HGB 12.9*  --   --  12.4*  --  11.8*  HCT 40.9  --   --  39.2  --  37.5*  PLT 220  --   --  215  --  221  APTT  --  28  --   --   --   --   LABPROT  --  13.4  --   --   --   --   INR  --  1.0  --   --   --   --   HEPARINUNFRC  --   --   --  0.27* 0.36 0.17*  CREATININE 1.15  --   --   --   --   --   TROPONINIHS 762*  --  593*  --   --   --     Estimated Creatinine Clearance: 105.6 mL/min (by C-G formula based on SCr of 1.15 mg/dL).   Medical History: Past Medical History:  Diagnosis Date   GERD (gastroesophageal reflux disease)    no meds   Hypertension    No pertinent past medical history     Assessment: 52 y.o. male w/ PMH of HTN, GERD presents with chest pain. A review of medical records reveals no chronic anticoagulation prior to arrival. cTn trending down from 762 to 593 and ECG from admission shows borderline ST elevation and demonstrates sinus rhythm but a repolarization abnormality suggestive of ischemia. 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 Patient likely going to receive a heart cath.  Pharmacy following for heparin infusion consult. Heparin drip was started 9/16 @ ~2000.  Goal of Therapy:  Heparin level 0.3-0.7 units/ml Monitor platelets by anticoagulation protocol: Yes  Plan: Date Time aPTT/HL Rate/Comment 0917 0503 0.27  Subtherapeutic 0917 1300 0.36  Therapeutic x 1 0918 0318 0.17  Subtherapeutic    PLAN: Bolus 3200 units x 1 Increase heparin infusion to 2000  units/hr Recheck HL in 6 hrs after rate change Monitor H&H daily while on heparin infusion  Otelia Sergeant, PharmD, [facility] 11/14/2022 3:44 AM

## 2022-11-14 NOTE — Progress Notes (Signed)
ANTICOAGULATION CONSULT NOTE  Pharmacy Consult for heparin infusion Indication: chest pain/ACS  No Known Allergies  Patient Measurements:   Heparin Dosing Weight: 107.2 kg  Vital Signs: Temp: 99.4 F (37.4 C) (09/18 2002) Temp Source: Oral (09/18 2002) BP: 151/104 (09/18 2143) Pulse Rate: 84 (09/18 2143)  Labs: Recent Labs    11/12/22 1809 11/12/22 2000 11/12/22 2122 11/13/22 0503 11/13/22 1300 11/14/22 0318 11/14/22 0955 11/14/22 2109  HGB 12.9*  --   --  12.4*  --  11.8*  --   --   HCT 40.9  --   --  39.2  --  37.5*  --   --   PLT 220  --   --  215  --  221  --   --   APTT  --  28  --   --   --   --   --   --   LABPROT  --  13.4  --   --   --   --   --   --   INR  --  1.0  --   --   --   --   --   --   HEPARINUNFRC  --   --   --  0.27*   < > 0.17* 0.77* 0.36  CREATININE 1.15  --   --   --   --   --   --  1.18  TROPONINIHS 762*  --  593*  --   --   --   --   --    < > = values in this interval not displayed.    Estimated Creatinine Clearance: 103 mL/min (by C-G formula based on SCr of 1.18 mg/dL).   Medical History: Past Medical History:  Diagnosis Date   GERD (gastroesophageal reflux disease)    no meds   Hypertension    No pertinent past medical history     Assessment: 52 y.o. male w/ PMH of HTN, GERD presents with chest pain. A review of medical records reveals no chronic anticoagulation prior to arrival. Pharmacy consulted to dose IV heparin. Patient transferred to [facility] for CABG evaluation.  Heparin level 0.36 on heparin 1850 units/hr, therapeutic but at low end of goal. No bleeding or issues with the infusion reported per RN.  Goal of Therapy:  Heparin level 0.3-0.7 units/ml Monitor platelets by anticoagulation protocol: Yes  PLAN: Increase heparin drip 1900 units/hr Check heparin level in 6hrs CBC daily while on heparin.  Follow up CABG consult  Loralee Pacas, PharmD, BCPS 11/14/2022 10:08 PM  Please check AMION for all [facility] phone  numbers After 10:00 PM, call Main Pharmacy [phone]

## 2022-11-14 NOTE — Progress Notes (Signed)
Brief hospitalist update note.  Nonbillable note.  Briefly, 52 year old male who presents for chest pain.  Status post left heart catheterization.  Severe multivessel coronary artery disease noted.  Discussion with patient regarding treatment options.  Current recommendation is for transfer to [facility] for evaluation for CABG by cardiothoracic surgery.  Patient was reluctant for this plan however after discussion with cardiology agrees with transfer.  Currently pending bed availability at [facility].  No changes in management.  Patient will remain on heparin gtt. and will discharge with drip in place.  Currently pending transfer.  Lolita Patella MD  No charge

## 2022-11-14 NOTE — Progress Notes (Signed)
Patient arrived, patient placement called.  VVS, denies any pain.

## 2022-11-14 NOTE — Progress Notes (Signed)
RN called report to 4E at [facility]. Report given to Silverio Decamp RN.

## 2022-11-14 NOTE — Progress Notes (Signed)
Rounding Note    Patient Name: LETHA LUNDIE Date of Encounter: 11/14/2022  [facility] Cardiologist: Dr. END - [facility]  Subjective   Denies chest pain overnight, remains on heparin infusion Concerns for surgical intervention and recovery Cardiac catheterization details from yesterday discussed  Inpatient Medications    Scheduled Meds:  aspirin EC  81 mg Oral Daily   atorvastatin  40 mg Oral q1800   lisinopril  30 mg Oral Daily   metoprolol tartrate  12.5 mg Oral BID   sodium chloride flush  3 mL Intravenous Q12H   sodium chloride flush  3 mL Intravenous Q12H   Continuous Infusions:  sodium chloride     sodium chloride     heparin 2,000 Units/hr (11/14/22 0536)   PRN Meds: sodium chloride, sodium chloride, acetaminophen, nitroGLYCERIN, ondansetron (ZOFRAN) IV, sodium chloride flush, sodium chloride flush   Vital Signs    Vitals:   11/13/22 2208 11/14/22 0105 11/14/22 0402 11/14/22 0833  BP: 116/89 (!) 141/67 132/72 (!) 168/83  Pulse: 78 72 79 75  Resp:  18 18 18   Temp:  98.1 F (36.7 C) 98.2 F (36.8 C) 98.2 F (36.8 C)  TempSrc:    Oral  SpO2:  98% 100% 100%  Weight:      Height:        Intake/Output Summary (Last 24 hours) at 11/14/2022 0848 Last data filed at 11/14/2022 0538 Gross per 24 hour  Intake 1183 ml  Output 502 ml  Net 681 ml      11/13/2022    2:25 PM 11/12/2022    7:49 PM 07/26/2022    6:05 PM  Last 3 Weights  Weight (lbs) 291 lb 289 lb 279 lb  Weight (kg) 131.997 kg 131.09 kg 126.554 kg      Telemetry    nsr - Personally Reviewed  ECG     - Personally Reviewed  Physical Exam   GEN: No acute distress. ,obese Neck: No JVD Cardiac: RRR, no murmurs, rubs, or gallops.  Respiratory: Clear to auscultation bilaterally. GI: Soft, nontender, non-distended  MS: No edema; No deformity. Neuro:  Nonfocal  Psych: Normal affect   Labs    High Sensitivity Troponin:   Recent Labs  Lab 11/12/22 1809 11/12/22 2122   TROPONINIHS 762* 593*     Chemistry Recent Labs  Lab 11/12/22 1809 11/12/22 2000  NA 137  --   K 4.4  --   CL 103  --   CO2 27  --   GLUCOSE 91  --   BUN 15  --   CREATININE 1.15  --   CALCIUM 9.3  --   PROT  --  7.9  ALBUMIN  --  4.0  AST  --  23  ALT  --  34  ALKPHOS  --  50  BILITOT  --  0.9  GFRNONAA >60  --   ANIONGAP 7  --     Lipids  Recent Labs  Lab 11/13/22 0503  CHOL 179  TRIG 203*  HDL 35*  LDLCALC 103*  CHOLHDL 5.1    Hematology Recent Labs  Lab 11/12/22 1809 11/13/22 0503 11/14/22 0318  WBC 7.2 6.2 6.7  RBC 4.99 4.82 4.63  HGB 12.9* 12.4* 11.8*  HCT 40.9 39.2 37.5*  MCV 82.0 81.3 81.0  MCH 25.9* 25.7* 25.5*  MCHC 31.5 31.6 31.5  RDW 14.0 13.6 13.5  PLT 220 215 221   Thyroid  Recent Labs  Lab 11/12/22 2122  TSH 1.991  FREET4 0.81    BNPNo results for input(s): "BNP", "PROBNP" in the last 168 hours.  DDimer  Recent Labs  Lab 11/12/22 2029  DDIMER 0.28     Radiology    CARDIAC CATHETERIZATION  Result Date: 11/13/2022 Conclusions: Multivessel CAD, including 50% ostial LMCA stenosis, mild luminal irregularities involving the LAD, 90% ostial and mid/distal LCx lesions, and sequential 50-60% RCA stenoses. LVEDP 18 mmHg.  Recommendations: Transfer to [facility] for CABG evaluation.  Ostial LCx lesion would be very difficult to treat percutaneously due to angulation of the vessel relative to the LMCA and LAD.  Additionally, there is at least moderate, bordering on severe ostial LMCA and sequential mid/distal RCA disease. Resume heparin infusion 2 hours after TR band removal. Aggressive secondary prevention of CAD. Yvonne Kendall, MD [facility]  ECHOCARDIOGRAM COMPLETE  Result Date: 11/13/2022    ECHOCARDIOGRAM REPORT   Patient Name:   RONTEZ PHILLIPPI Dena Date of Exam: 11/13/2022 Medical Rec #:  [id number]      Height:       72.0 in Accession #:    [id number]     Weight:       289.0 lb Date of Birth:  [birth year]      BSA:          2.491 m  Patient Age:    52 years       BP:           142/81 mmHg Patient Gender: M              HR:           72 bpm. Exam Location:  [facility] Procedure: 2D Echo, Cardiac Doppler and Color Doppler Indications:     Chest pain R07.9  History:         Patient has no prior history of Echocardiogram examinations.                  Risk Factors:Hypertension.  Sonographer:     Cristela Blue Referring Phys:  [id number] CADENCE H FURTH Diagnosing Phys: Yvonne Kendall MD IMPRESSIONS  1. Left ventricular ejection fraction, by estimation, is 45 to 50%. The left ventricle has mildly decreased function. The left ventricle demonstrates regional wall motion abnormalities (see scoring diagram/findings for description). There is moderate left ventricular hypertrophy. Left ventricular diastolic parameters are consistent with Grade II diastolic dysfunction (pseudonormalization). Elevated left atrial pressure. There is severe hypokinesis of the left ventricular, basal-mid inferior wall and inferolateral wall.  2. Right ventricular systolic function is normal. The right ventricular size is normal. Tricuspid regurgitation signal is inadequate for assessing PA pressure.  3. The mitral valve is degenerative. Trivial mitral valve regurgitation. No evidence of mitral stenosis.  4. The aortic valve was not well visualized. Aortic valve regurgitation is not visualized. No aortic stenosis is present. FINDINGS  Left Ventricle: Left ventricular ejection fraction, by estimation, is 45 to 50%. The left ventricle has mildly decreased function. The left ventricle demonstrates regional wall motion abnormalities. Severe hypokinesis of the left ventricular, basal-mid inferior wall and inferolateral wall. The left ventricular internal cavity size was normal in size. There is moderate left ventricular hypertrophy. Left ventricular diastolic parameters are consistent with Grade II diastolic dysfunction (pseudonormalization). Elevated left atrial pressure. Right Ventricle:  The right ventricular size is normal. Right vetricular wall thickness was not well visualized. Right ventricular systolic function is normal. Tricuspid regurgitation signal is inadequate for assessing PA pressure. Left Atrium: Left atrial size was normal in  size. Right Atrium: Right atrial size was normal in size. Pericardium: The pericardium was not well visualized. Mitral Valve: The mitral valve is degenerative in appearance. There is mild thickening of the mitral valve leaflet(s). Trivial mitral valve regurgitation. No evidence of mitral valve stenosis. MV peak gradient, 4.8 mmHg. The mean mitral valve gradient is  1.0 mmHg. Tricuspid Valve: The tricuspid valve is not well visualized. Tricuspid valve regurgitation is trivial. Aortic Valve: The aortic valve was not well visualized. Aortic valve regurgitation is not visualized. No aortic stenosis is present. Aortic valve mean gradient measures 2.0 mmHg. Aortic valve peak gradient measures 2.8 mmHg. Aortic valve area, by VTI measures 3.85 cm. Pulmonic Valve: The pulmonic valve was not well visualized. Pulmonic valve regurgitation is not visualized. No evidence of pulmonic stenosis. Aorta: The aortic root is normal in size and structure. Pulmonary Artery: The pulmonary artery is not well seen. IAS/Shunts: The interatrial septum was not well visualized.  LEFT VENTRICLE PLAX 2D LVIDd:         5.30 cm   Diastology LVIDs:         3.60 cm   LV e' medial:    5.22 cm/s LV PW:         1.40 cm   LV E/e' medial:  16.1 LV IVS:        1.30 cm   LV e' lateral:   6.42 cm/s LVOT diam:     2.10 cm   LV E/e' lateral: 13.1 LV SV:         52 LV SV Index:   21 LVOT Area:     3.46 cm  RIGHT VENTRICLE RV Basal diam:  3.30 cm RV Mid diam:    2.80 cm LEFT ATRIUM             Index        RIGHT ATRIUM           Index LA diam:        3.80 cm 1.53 cm/m   RA Area:     13.40 cm LA Vol (A2C):   40.2 ml 16.14 ml/m  RA Volume:   30.90 ml  12.40 ml/m LA Vol (A4C):   30.4 ml 12.20 ml/m LA  Biplane Vol: 35.4 ml 14.21 ml/m  AORTIC VALVE AV Area (Vmax):    3.16 cm AV Area (Vmean):   3.07 cm AV Area (VTI):     3.85 cm AV Vmax:           83.10 cm/s AV Vmean:          58.800 cm/s AV VTI:            0.136 m AV Peak Grad:      2.8 mmHg AV Mean Grad:      2.0 mmHg LVOT Vmax:         75.80 cm/s LVOT Vmean:        52.100 cm/s LVOT VTI:          0.151 m LVOT/AV VTI ratio: 1.11  AORTA Ao Root diam: 3.00 cm MITRAL VALVE MV Area (PHT): 6.07 cm    SHUNTS MV Area VTI:   2.44 cm    Systemic VTI:  0.15 m MV Peak grad:  4.8 mmHg    Systemic Diam: 2.10 cm MV Mean grad:  1.0 mmHg MV Vmax:       1.09 m/s MV Vmean:      51.1 cm/s MV Decel Time: 125 msec MV E velocity: 83.80  cm/s MV A velocity: 48.20 cm/s MV E/A ratio:  1.74 Yvonne Kendall MD Electronically signed by Yvonne Kendall MD Signature Date/Time: 11/13/2022/9:47:11 AM    Final    DG Chest Port 1 View  Result Date: 11/12/2022 CLINICAL DATA:  Chest pain on the left for 3 days, initial encounter EXAM: PORTABLE CHEST 1 VIEW COMPARISON:  None Available. FINDINGS: The heart size and mediastinal contours are within normal limits. Both lungs are clear. The visualized skeletal structures are unremarkable. IMPRESSION: No active disease. Electronically Signed   By: Alcide Clever M.D.   On: 11/12/2022 21:31    Cardiac Studies    Patient Profile   BRYCEON WOLAVER is a 52 y.o. male with a hx of hypertension, obesity, prediabetic who is being seen 11/13/2022 for the evaluation of chest pain and elevated troponin /NSTEMI  Assessment & Plan    1: NSTEMI - HS troponin [id number] Cardiac cath yesterday: multivessel disease including Left Main EF 45 to 50% - continue Aspirin 81mg  daily, Lipitor 40mg  daily, Lopressor 25mg  BID and Lisinopril 30mg  daily Long discussion with him concerning catheterization results as above, recommend transfer to [facility] for [facility] of CABG - transfer has been arranged, continue heparin infusion  HTN - PTA lisinopril - lisinopril  30mg  daily and Lopressor up to 25 mg twice daily    HLD - LDL 103 - continue Lipitor 80 daily     Total encounter time more than 50 minutes  Greater than 50% was spent in counseling and coordination of care with the patient   For questions or updates, please contact [facility] Please consult [email or web address] for contact info under        Signed, Julien Nordmann, MD  11/14/2022, 8:48 AM

## 2022-11-15 ENCOUNTER — Inpatient Hospital Stay (HOSPITAL_COMMUNITY): Payer: Commercial Managed Care - PPO

## 2022-11-15 DIAGNOSIS — I259 Chronic ischemic heart disease, unspecified: Secondary | ICD-10-CM | POA: Diagnosis not present

## 2022-11-15 DIAGNOSIS — I2510 Atherosclerotic heart disease of native coronary artery without angina pectoris: Secondary | ICD-10-CM

## 2022-11-15 LAB — CBC
HCT: 40.3 % (ref 39.0–52.0)
Hemoglobin: 12.3 g/dL — ABNORMAL LOW (ref 13.0–17.0)
MCH: 25.1 pg — ABNORMAL LOW (ref 26.0–34.0)
MCHC: 30.5 g/dL (ref 30.0–36.0)
MCV: 82.2 fL (ref 80.0–100.0)
Platelets: 229 10*3/uL (ref 150–400)
RBC: 4.9 MIL/uL (ref 4.22–5.81)
RDW: 13.7 % (ref 11.5–15.5)
WBC: 7.8 10*3/uL (ref 4.0–10.5)
nRBC: 0 % (ref 0.0–0.2)

## 2022-11-15 LAB — HEPARIN LEVEL (UNFRACTIONATED)
Heparin Unfractionated: 0.33 IU/mL (ref 0.30–0.70)
Heparin Unfractionated: 0.37 IU/mL (ref 0.30–0.70)

## 2022-11-15 MED ORDER — OXYCODONE HCL 5 MG PO TABS
5.0000 mg | ORAL_TABLET | Freq: Once | ORAL | Status: AC
  Administered 2022-11-15: 5 mg via ORAL
  Filled 2022-11-15: qty 1

## 2022-11-15 MED ORDER — DICLOFENAC SODIUM 1 % EX GEL: 4.0000 g | Freq: Four times a day (QID) | CUTANEOUS | Status: DC | PRN

## 2022-11-15 MED ORDER — CARVEDILOL 6.25 MG PO TABS
6.2500 mg | ORAL_TABLET | Freq: Two times a day (BID) | ORAL | Status: DC
  Administered 2022-11-15 – 2022-11-18 (×7): 6.25 mg via ORAL
  Filled 2022-11-15 (×7): qty 1

## 2022-11-15 NOTE — Progress Notes (Signed)
ANTICOAGULATION CONSULT NOTE  Pharmacy Consult for heparin infusion Indication: chest pain/ACS  No Known Allergies  Patient Measurements: Weight: 134.3 kg (296 lb) Heparin Dosing Weight: 107.2 kg  Vital Signs: Temp: 98.5 F (36.9 C) (09/19 0840) Temp Source: Oral (09/19 0840) BP: 146/97 (09/19 0840) Pulse Rate: 70 (09/19 0840)  Labs: Recent Labs    11/12/22 1809 11/12/22 2000 11/12/22 2122 11/13/22 0503 11/13/22 1300 11/14/22 0318 11/14/22 0955 11/14/22 2109 11/15/22 0313 11/15/22 1001  HGB 12.9*  --   --  12.4*  --  11.8*  --   --  12.3*  --   HCT 40.9  --   --  39.2  --  37.5*  --   --  40.3  --   PLT 220  --   --  215  --  221  --   --  229  --   APTT  --  28  --   --   --   --   --   --   --   --   LABPROT  --  13.4  --   --   --   --   --   --   --   --   INR  --  1.0  --   --   --   --   --   --   --   --   HEPARINUNFRC  --   --   --  0.27*   < > 0.17* 0.77* 0.36  --  0.33  CREATININE 1.15  --   --   --   --   --   --  1.18  --   --   TROPONINIHS 762*  --  593*  --   --   --   --   --   --   --    < > = values in this interval not displayed.    Estimated Creatinine Clearance: 103.9 mL/min (by C-G formula based on SCr of 1.18 mg/dL).   Medical History: Past Medical History:  Diagnosis Date   GERD (gastroesophageal reflux disease)    no meds   Hypertension    No pertinent past medical history     Assessment: 52 y.o. male w/ PMH of HTN, GERD presents with chest pain. A review of medical records reveals no chronic anticoagulation prior to arrival. Pharmacy consulted to dose IV heparin. Patient transferred to [facility] for CABG evaluation.  Heparin level (0.33) therapeutic but down despite a small rate increase to 1900 units/hr.  Of note, was supratherapeutic (0.77) on 2000 units/hr before transferring to [facility].  CBC stable (Hgb 12.3, pltc 229).  No issues with infusion or s/sx bleeding per RN.  Goal of Therapy:  Heparin level 0.3-0.7 units/ml Monitor platelets by  anticoagulation protocol: Yes  PLAN: Increase heparin drip 1950 units/hr Check heparin level in 6hrs CBC daily while on heparin.  Follow up timing of CABG   Trixie Rude, PharmD Clinical Pharmacist 11/15/2022  11:34 AM

## 2022-11-15 NOTE — Progress Notes (Addendum)
Progress Note  Patient Name: Alexander Simon Date of Encounter: 11/15/2022  Primary Cardiologist: Yvonne Kendall, MD  Subjective   Feeling well without CP or SOB. He does note left knee pain for the last 3 days, first noticed after playing basketball/working with no known injury. The discomfort is slightly below the patella. Able to weight bear. No deformity.  Inpatient Medications    Scheduled Meds:  aspirin EC  81 mg Oral Daily   atorvastatin  80 mg Oral Daily   metoprolol tartrate  25 mg Oral BID   sodium chloride flush  3 mL Intravenous Q12H   Continuous Infusions:  sodium chloride     heparin 1,900 Units/hr (11/15/22 0549)   PRN Meds: sodium chloride, acetaminophen, nitroGLYCERIN, ondansetron (ZOFRAN) IV, sodium chloride flush   Vital Signs    Vitals:   11/14/22 2344 11/15/22 0447 11/15/22 0500 11/15/22 0840  BP: (!) 144/77 (!) 150/77  (!) 146/97  Pulse: 71 68  70  Resp: 19 13  18   Temp: 98.3 F (36.8 C) 98.2 F (36.8 C)  98.5 F (36.9 C)  TempSrc: Oral Oral  Oral  SpO2: 98% 99%  96%  Weight:   134.3 kg     Intake/Output Summary (Last 24 hours) at 11/15/2022 0919 Last data filed at 11/15/2022 0840 Gross per 24 hour  Intake 535.78 ml  Output 600 ml  Net -64.22 ml      11/15/2022    5:00 AM 11/13/2022    2:25 PM 11/12/2022    7:49 PM  Last 3 Weights  Weight (lbs) 296 lb 291 lb 289 lb  Weight (kg) 134.265 kg 131.997 kg 131.09 kg     Telemetry    Overnight SB briefly, nonsustained, normal resting daytime HR. ST upsloping V1 c/w chronic EKG findings- Personally Reviewed  ECG    No new tracings, reviewed from 9/16 - Personally Reviewed  Physical Exam   GEN: No acute distress.  HEENT: Normocephalic, atraumatic, sclera non-icteric. Neck: No JVD or bruits. Cardiac: RRR no murmurs, rubs, or gallops.  Respiratory: Clear to auscultation bilaterally. Breathing is unlabored. GI: Soft, nontender, non-distended, BS +x 4. MS: no deformity. Extremities: No  clubbing or cyanosis. No edema. Distal pedal pulses are 2+ and equal bilaterally. Right radial cath site without hematoma or ecchymosis; good pulse. Left without any deformity, swelling, warmth, erythema or obvious malformation  Neuro:  AAOx3. Follows commands. Psych:  Responds to questions appropriately with a normal affect.  Labs    High Sensitivity Troponin:   Recent Labs  Lab 11/12/22 1809 11/12/22 2122  TROPONINIHS 762* 593*      Cardiac EnzymesNo results for input(s): "TROPONINI" in the last 168 hours. No results for input(s): "TROPIPOC" in the last 168 hours.   Chemistry Recent Labs  Lab 11/12/22 1809 11/12/22 2000 11/14/22 2109  NA 137  --  139  K 4.4  --  4.1  CL 103  --  106  CO2 27  --  28  GLUCOSE 91  --  104*  BUN 15  --  16  CREATININE 1.15  --  1.18  CALCIUM 9.3  --  9.1  PROT  --  7.9  --   ALBUMIN  --  4.0  --   AST  --  23  --   ALT  --  34  --   ALKPHOS  --  50  --   BILITOT  --  0.9  --   GFRNONAA >60  --  >60  ANIONGAP 7  --  5     Hematology Recent Labs  Lab 11/13/22 0503 11/14/22 0318 11/15/22 0313  WBC 6.2 6.7 7.8  RBC 4.82 4.63 4.90  HGB 12.4* 11.8* 12.3*  HCT 39.2 37.5* 40.3  MCV 81.3 81.0 82.2  MCH 25.7* 25.5* 25.1*  MCHC 31.6 31.5 30.5  RDW 13.6 13.5 13.7  PLT 215 221 229    BNPNo results for input(s): "BNP", "PROBNP" in the last 168 hours.   DDimer  Recent Labs  Lab 11/12/22 2029  DDIMER 0.28     Radiology    CARDIAC CATHETERIZATION  Result Date: 11/13/2022 Conclusions: Multivessel CAD, including 50% ostial LMCA stenosis, mild luminal irregularities involving the LAD, 90% ostial and mid/distal LCx lesions, and sequential 50-60% RCA stenoses. LVEDP 18 mmHg.  Recommendations: Transfer to [facility] for CABG evaluation.  Ostial LCx lesion would be very difficult to treat percutaneously due to angulation of the vessel relative to the LMCA and LAD.  Additionally, there is at least moderate, bordering on severe ostial LMCA  and sequential mid/distal RCA disease. Resume heparin infusion 2 hours after TR band removal. Aggressive secondary prevention of CAD. Yvonne Kendall, MD [facility]   Cardiac Studies   2d echo 11/13/22   1. Left ventricular ejection fraction, by estimation, is 45 to 50%. The  left ventricle has mildly decreased function. The left ventricle  demonstrates regional wall motion abnormalities (see scoring  diagram/findings for description). There is moderate  left ventricular hypertrophy. Left ventricular diastolic parameters are  consistent with Grade II diastolic dysfunction (pseudonormalization).  Elevated left atrial pressure. There is severe hypokinesis of the left  ventricular, basal-mid inferior wall and  inferolateral wall.   2. Right ventricular systolic function is normal. The right ventricular  size is normal. Tricuspid regurgitation signal is inadequate for assessing  PA pressure.   3. The mitral valve is degenerative. Trivial mitral valve regurgitation.  No evidence of mitral stenosis.   4. The aortic valve was not well visualized. Aortic valve regurgitation  is not visualized. No aortic stenosis is present.   Cath 11/13/22 Multivessel CAD, including 50% ostial LMCA stenosis, mild luminal irregularities involving the LAD, 90% ostial and mid/distal LCx lesions, and sequential 50-60% RCA stenoses. LVEDP 18 mmHg.   Recommendations: Transfer to [facility] for CABG evaluation.  Ostial LCx lesion would be very difficult to treat percutaneously due to angulation of the vessel relative to the LMCA and LAD.  Additionally, there is at least moderate, bordering on severe ostial LMCA and sequential mid/distal RCA disease. Resume heparin infusion 2 hours after TR band removal. Aggressive secondary prevention of CAD.   Yvonne Kendall, MD [facility]      Patient Profile     52 y.o. 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  Assessment & Plan    1. CAD, NSTEMI - cath as above, pending CVTS eval for CABG - patient reports he was told possibly Monday - continue ASA 81mg , atorvastatin 80mg  daily, metoprolol 25mg  BID, heparin per pharmacy - anticipate will need Plavix at discharge post CABG if no bleeding concerns/stable labs as part of med rx for NSTEMI - pre CABG dopplers pending  2. 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 HLD - LDL 103, trig 203 -> started on atorvastatin 80mg  daily at [facility] - if the patient is tolerating statin at time of follow-up appointment, would consider rechecking liver function/lipid panel in 6-8 weeks.  4. Pre-DM - A1c 6.2  5. Left knee pain - will get 4v+sunrise view film and trial voltaren gel - consider ortho consult if no improvement or abnormality seen - not acutely warm or tender  6. Nocturnal bradycardia/obesity - consider OP eval for OSA  For questions or updates, please contact [facility] Please consult [email or web address] for contact info under Cardiology/STEMI.  Signed, Laurann Montana, PA-C 11/15/2022, 9:19 AM    Patient seen and examined with Ronie Spies PA-C.  Agree as above, with the following exceptions and changes as noted below. No CP, eating during our visit. Gen: NAD, CV: RRR, no murmurs, Lungs: clear, Abd: soft, Extrem: Warm, well perfused, no edema, Neuro/Psych: alert and oriented x 3, normal mood and affect. All available labs, radiology testing, previous records reviewed. Anticipating CABG on Monday.  Parke Poisson, MD 11/15/22 12:29 PM

## 2022-11-15 NOTE — Progress Notes (Addendum)
Pt has a complaint of left knee pain which has happened more severe 3 days ago. Left knee is warm to touch, but no significant swelling or redness. Pt stated he played basketball and took Ibuprofen at home for knee pain. Dr. Margo Aye was notified. Order received for Oxycodone 5 mg 1 time dose. We combined with Tylenol and cool compressed which is helping well with pain alleviated.  Pt denies chest pain. He is stable hemodynamically, afebrile, sinus rhythm on the monitor, on room air spo2 96% with normal respiratory efforts and no acute distress noted overnight. Continue Heparin gtt. We will continue to monitor.    Filiberto Pinks, RN

## 2022-11-15 NOTE — Progress Notes (Addendum)
Meds d/w Dr. Jacques Navy. We'll switch metop to carvedilol 6.25mg  and follow BP. Also discussed knee film. Will trial conservative measures with Voltaren gel and ice PRN.

## 2022-11-15 NOTE — Consult Note (Addendum)
[address]       [place] [address]             [phone]        MCALLISTER DALESIO [facility] Medical Record #[id number] Date of Birth: 10/08/1970  Referring: Yvonne Kendall, MD Primary Care: Pcp, No Primary Cardiologist:Christopher End, MD  Chief Complaint:   Multivessel CAD  History of Present Illness:      Alexander Simon is a 52 yo obese AA male with history of GERD and HTN.  The patient presented to the ED on 9/16 with complaints of substernal chest pain, left arm numbness and shortness of breath.  The patient's symptoms developed while he was out to dinner.  He drove home after but continued to feel bad prompting him to seek evaluation at an urgent care facility.  Troponin level obtained was elevated and they felt patient needed a higher level of care and sent him to the ED.  He was chest pain free on arrival to the ED.  EKG obtained showed NSR with some T wave inversions and subtle ST depression.  He was ruled in for NSTEMI, admitted and placed on Heparin drip.  Cardiology consult was obtained and they recommended the patient undergo cardiac catheterization.  He was agreeable to proceed.  This was performed on 11/13/2022 and showed multivessel CAD.  It was felt the ostial left circumflex would be difficult to treat with PCI.  Due to this it was recommended the patient be transferred to [facility] for possible coronary bypass grafting.  He is a never smoker.  He doesn't know his father but states his mother never had any issues with coronary disease.  He is active playing basketball.  Current Activity/ Functional Status: Patient is independent with mobility/ambulation, transfers, ADL's, IADL's.   Zubrod Score: At the time of surgery this patient's most appropriate activity status/level should be described as: []     0    Normal activity, no symptoms []     1    Restricted in physical strenuous activity but ambulatory, able to do out light work []     2     Ambulatory and capable of self care, unable to do work activities, up and about                 more than 50%  Of the time                            []     3    Only limited self care, in bed greater than 50% of waking hours []     4    Completely disabled, no self care, confined to bed or chair []     5    Moribund  Past Medical History:  Diagnosis Date   GERD (gastroesophageal reflux disease)    no meds   Hypertension    No pertinent past medical history     Past Surgical History:  Procedure Laterality Date   CIRCUMCISION     EXCISION MASS NECK Right 06/12/2019   Procedure: EXCISION Superficial MASS NECK;  Surgeon: Duanne Guess, MD;  Location: [facility] ORS;  Service: General;  Laterality: Right;   EXCISION OF SKIN TAG Bilateral 06/12/2019   Procedure: EXCISION OF SKIN TAG on neck;  Surgeon: Duanne Guess, MD;  Location: [facility] ORS;  Service: General;  Laterality: Bilateral;   LEFT HEART CATH AND CORONARY ANGIOGRAPHY N/A  Dental: Last Dentist visit:   Eye : blurred vision [  ]; diplopia [   ]; vision changes [  ];  Amaurosis fugax[   ]; Resp: cough [  ];  wheezing[  ];  hemoptysis[  ]; shortness of breath[  Y]; paroxysmal nocturnal dyspnea[  ]; dyspnea on exertion[  ]; or orthopnea[  ];  GI:  gallstones[  ], vomiting[  ];  dysphagia[  ]; melena[  ];  hematochezia [  ]; heartburn[  ];   Hx of  Colonoscopy[  ]; GU: kidney stones [  ]; hematuria[  ];   dysuria [  ];  nocturia[  ];  history of     obstruction [  ]; urinary frequency [  ]             Skin: rash, swelling[  N];, hair loss[  ];  peripheral edema[ N ];  or itching[  ]; Musculosketetal: myalgias[  ];  joint swelling[  ];  joint erythema[  ];  joint pain[  ];  back pain[  ];  Heme/Lymph: bruising[  ];  bleeding[  ];  anemia[  ];  Neuro: TIA[  ];  headaches[  ];  stroke[N  ];  vertigo[  ];  seizures[  ];   paresthesias[  ];  difficulty walking[ N ];  Psych:depression[  ]; anxiety[  ];  Endocrine: diabetes[ N ];  thyroid dysfunction[  ];        Physical Exam: BP (!) 146/97 (BP Location: Left Arm)   Pulse 70   Temp 98.5 F (36.9 C) (Oral)   Resp 18   Wt 134.3 kg   SpO2 96%   BMI 40.14 kg/m   General appearance: alert, cooperative, and no distress, morbid obesity Head: Normocephalic, without obvious abnormality, atraumatic Neck: no adenopathy, no carotid bruit, no JVD, supple, symmetrical, trachea midline, and thyroid not enlarged, symmetric, no tenderness/mass/nodules Resp: clear to auscultation bilaterally Cardio: regular rate and rhythm GI: soft, non-tender; bowel sounds normal; no masses,  no organomegaly Extremities: extremities normal, atraumatic, no cyanosis or edema Neurologic: Grossly normal  Diagnostic Studies & Laboratory data:     Recent Radiology Findings:   CARDIAC CATHETERIZATION  Result Date: 11/13/2022 Conclusions: Multivessel CAD, including 50% ostial LMCA stenosis, mild luminal irregularities involving the LAD, 90% ostial and mid/distal LCx lesions, and sequential 50-60% RCA stenoses. LVEDP 18 mmHg.  Recommendations: Transfer to [facility] for CABG evaluation.  Ostial LCx lesion would be very difficult to treat percutaneously due to angulation of the vessel relative to the LMCA and LAD.  Additionally, there is at least moderate, bordering on severe ostial LMCA and sequential mid/distal RCA disease. Resume heparin infusion 2 hours after TR band removal. Aggressive secondary prevention of CAD. Yvonne Kendall, MD [facility]  ECHOCARDIOGRAM COMPLETE  Result Date: 11/13/2022    ECHOCARDIOGRAM REPORT   Patient Name:   Alexander Simon Date of Exam: 11/13/2022 Medical Rec #:  [id number]      Height:       72.0 in Accession #:    [id number]     Weight:       289.0 lb Date of Birth:  April 25, 1970      BSA:          2.491 m Patient Age:    51 years       BP:           142/81 mmHg Patient Gender: M  [address]       [place] [address]             [phone]        MCALLISTER DALESIO [facility] Medical Record #[id number] Date of Birth: 10/08/1970  Referring: Yvonne Kendall, MD Primary Care: Pcp, No Primary Cardiologist:Christopher End, MD  Chief Complaint:   Multivessel CAD  History of Present Illness:      Alexander Simon is a 52 yo obese AA male with history of GERD and HTN.  The patient presented to the ED on 9/16 with complaints of substernal chest pain, left arm numbness and shortness of breath.  The patient's symptoms developed while he was out to dinner.  He drove home after but continued to feel bad prompting him to seek evaluation at an urgent care facility.  Troponin level obtained was elevated and they felt patient needed a higher level of care and sent him to the ED.  He was chest pain free on arrival to the ED.  EKG obtained showed NSR with some T wave inversions and subtle ST depression.  He was ruled in for NSTEMI, admitted and placed on Heparin drip.  Cardiology consult was obtained and they recommended the patient undergo cardiac catheterization.  He was agreeable to proceed.  This was performed on 11/13/2022 and showed multivessel CAD.  It was felt the ostial left circumflex would be difficult to treat with PCI.  Due to this it was recommended the patient be transferred to [facility] for possible coronary bypass grafting.  He is a never smoker.  He doesn't know his father but states his mother never had any issues with coronary disease.  He is active playing basketball.  Current Activity/ Functional Status: Patient is independent with mobility/ambulation, transfers, ADL's, IADL's.   Zubrod Score: At the time of surgery this patient's most appropriate activity status/level should be described as: []     0    Normal activity, no symptoms []     1    Restricted in physical strenuous activity but ambulatory, able to do out light work []     2     Ambulatory and capable of self care, unable to do work activities, up and about                 more than 50%  Of the time                            []     3    Only limited self care, in bed greater than 50% of waking hours []     4    Completely disabled, no self care, confined to bed or chair []     5    Moribund  Past Medical History:  Diagnosis Date   GERD (gastroesophageal reflux disease)    no meds   Hypertension    No pertinent past medical history     Past Surgical History:  Procedure Laterality Date   CIRCUMCISION     EXCISION MASS NECK Right 06/12/2019   Procedure: EXCISION Superficial MASS NECK;  Surgeon: Duanne Guess, MD;  Location: [facility] ORS;  Service: General;  Laterality: Right;   EXCISION OF SKIN TAG Bilateral 06/12/2019   Procedure: EXCISION OF SKIN TAG on neck;  Surgeon: Duanne Guess, MD;  Location: [facility] ORS;  Service: General;  Laterality: Bilateral;   LEFT HEART CATH AND CORONARY ANGIOGRAPHY N/A  Dental: Last Dentist visit:   Eye : blurred vision [  ]; diplopia [   ]; vision changes [  ];  Amaurosis fugax[   ]; Resp: cough [  ];  wheezing[  ];  hemoptysis[  ]; shortness of breath[  Y]; paroxysmal nocturnal dyspnea[  ]; dyspnea on exertion[  ]; or orthopnea[  ];  GI:  gallstones[  ], vomiting[  ];  dysphagia[  ]; melena[  ];  hematochezia [  ]; heartburn[  ];   Hx of  Colonoscopy[  ]; GU: kidney stones [  ]; hematuria[  ];   dysuria [  ];  nocturia[  ];  history of     obstruction [  ]; urinary frequency [  ]             Skin: rash, swelling[  N];, hair loss[  ];  peripheral edema[ N ];  or itching[  ]; Musculosketetal: myalgias[  ];  joint swelling[  ];  joint erythema[  ];  joint pain[  ];  back pain[  ];  Heme/Lymph: bruising[  ];  bleeding[  ];  anemia[  ];  Neuro: TIA[  ];  headaches[  ];  stroke[N  ];  vertigo[  ];  seizures[  ];   paresthesias[  ];  difficulty walking[ N ];  Psych:depression[  ]; anxiety[  ];  Endocrine: diabetes[ N ];  thyroid dysfunction[  ];        Physical Exam: BP (!) 146/97 (BP Location: Left Arm)   Pulse 70   Temp 98.5 F (36.9 C) (Oral)   Resp 18   Wt 134.3 kg   SpO2 96%   BMI 40.14 kg/m   General appearance: alert, cooperative, and no distress, morbid obesity Head: Normocephalic, without obvious abnormality, atraumatic Neck: no adenopathy, no carotid bruit, no JVD, supple, symmetrical, trachea midline, and thyroid not enlarged, symmetric, no tenderness/mass/nodules Resp: clear to auscultation bilaterally Cardio: regular rate and rhythm GI: soft, non-tender; bowel sounds normal; no masses,  no organomegaly Extremities: extremities normal, atraumatic, no cyanosis or edema Neurologic: Grossly normal  Diagnostic Studies & Laboratory data:     Recent Radiology Findings:   CARDIAC CATHETERIZATION  Result Date: 11/13/2022 Conclusions: Multivessel CAD, including 50% ostial LMCA stenosis, mild luminal irregularities involving the LAD, 90% ostial and mid/distal LCx lesions, and sequential 50-60% RCA stenoses. LVEDP 18 mmHg.  Recommendations: Transfer to [facility] for CABG evaluation.  Ostial LCx lesion would be very difficult to treat percutaneously due to angulation of the vessel relative to the LMCA and LAD.  Additionally, there is at least moderate, bordering on severe ostial LMCA and sequential mid/distal RCA disease. Resume heparin infusion 2 hours after TR band removal. Aggressive secondary prevention of CAD. Yvonne Kendall, MD [facility]  ECHOCARDIOGRAM COMPLETE  Result Date: 11/13/2022    ECHOCARDIOGRAM REPORT   Patient Name:   Alexander Simon Date of Exam: 11/13/2022 Medical Rec #:  [id number]      Height:       72.0 in Accession #:    [id number]     Weight:       289.0 lb Date of Birth:  April 25, 1970      BSA:          2.491 m Patient Age:    51 years       BP:           142/81 mmHg Patient Gender: M  [address]       [place] [address]             [phone]        MCALLISTER DALESIO [facility] Medical Record #[id number] Date of Birth: 10/08/1970  Referring: Yvonne Kendall, MD Primary Care: Pcp, No Primary Cardiologist:Christopher End, MD  Chief Complaint:   Multivessel CAD  History of Present Illness:      Alexander Simon is a 52 yo obese AA male with history of GERD and HTN.  The patient presented to the ED on 9/16 with complaints of substernal chest pain, left arm numbness and shortness of breath.  The patient's symptoms developed while he was out to dinner.  He drove home after but continued to feel bad prompting him to seek evaluation at an urgent care facility.  Troponin level obtained was elevated and they felt patient needed a higher level of care and sent him to the ED.  He was chest pain free on arrival to the ED.  EKG obtained showed NSR with some T wave inversions and subtle ST depression.  He was ruled in for NSTEMI, admitted and placed on Heparin drip.  Cardiology consult was obtained and they recommended the patient undergo cardiac catheterization.  He was agreeable to proceed.  This was performed on 11/13/2022 and showed multivessel CAD.  It was felt the ostial left circumflex would be difficult to treat with PCI.  Due to this it was recommended the patient be transferred to [facility] for possible coronary bypass grafting.  He is a never smoker.  He doesn't know his father but states his mother never had any issues with coronary disease.  He is active playing basketball.  Current Activity/ Functional Status: Patient is independent with mobility/ambulation, transfers, ADL's, IADL's.   Zubrod Score: At the time of surgery this patient's most appropriate activity status/level should be described as: []     0    Normal activity, no symptoms []     1    Restricted in physical strenuous activity but ambulatory, able to do out light work []     2     Ambulatory and capable of self care, unable to do work activities, up and about                 more than 50%  Of the time                            []     3    Only limited self care, in bed greater than 50% of waking hours []     4    Completely disabled, no self care, confined to bed or chair []     5    Moribund  Past Medical History:  Diagnosis Date   GERD (gastroesophageal reflux disease)    no meds   Hypertension    No pertinent past medical history     Past Surgical History:  Procedure Laterality Date   CIRCUMCISION     EXCISION MASS NECK Right 06/12/2019   Procedure: EXCISION Superficial MASS NECK;  Surgeon: Duanne Guess, MD;  Location: [facility] ORS;  Service: General;  Laterality: Right;   EXCISION OF SKIN TAG Bilateral 06/12/2019   Procedure: EXCISION OF SKIN TAG on neck;  Surgeon: Duanne Guess, MD;  Location: [facility] ORS;  Service: General;  Laterality: Bilateral;   LEFT HEART CATH AND CORONARY ANGIOGRAPHY N/A  [address]       [place] [address]             [phone]        MCALLISTER DALESIO [facility] Medical Record #[id number] Date of Birth: 10/08/1970  Referring: Yvonne Kendall, MD Primary Care: Pcp, No Primary Cardiologist:Christopher End, MD  Chief Complaint:   Multivessel CAD  History of Present Illness:      Alexander Simon is a 52 yo obese AA male with history of GERD and HTN.  The patient presented to the ED on 9/16 with complaints of substernal chest pain, left arm numbness and shortness of breath.  The patient's symptoms developed while he was out to dinner.  He drove home after but continued to feel bad prompting him to seek evaluation at an urgent care facility.  Troponin level obtained was elevated and they felt patient needed a higher level of care and sent him to the ED.  He was chest pain free on arrival to the ED.  EKG obtained showed NSR with some T wave inversions and subtle ST depression.  He was ruled in for NSTEMI, admitted and placed on Heparin drip.  Cardiology consult was obtained and they recommended the patient undergo cardiac catheterization.  He was agreeable to proceed.  This was performed on 11/13/2022 and showed multivessel CAD.  It was felt the ostial left circumflex would be difficult to treat with PCI.  Due to this it was recommended the patient be transferred to [facility] for possible coronary bypass grafting.  He is a never smoker.  He doesn't know his father but states his mother never had any issues with coronary disease.  He is active playing basketball.  Current Activity/ Functional Status: Patient is independent with mobility/ambulation, transfers, ADL's, IADL's.   Zubrod Score: At the time of surgery this patient's most appropriate activity status/level should be described as: []     0    Normal activity, no symptoms []     1    Restricted in physical strenuous activity but ambulatory, able to do out light work []     2     Ambulatory and capable of self care, unable to do work activities, up and about                 more than 50%  Of the time                            []     3    Only limited self care, in bed greater than 50% of waking hours []     4    Completely disabled, no self care, confined to bed or chair []     5    Moribund  Past Medical History:  Diagnosis Date   GERD (gastroesophageal reflux disease)    no meds   Hypertension    No pertinent past medical history     Past Surgical History:  Procedure Laterality Date   CIRCUMCISION     EXCISION MASS NECK Right 06/12/2019   Procedure: EXCISION Superficial MASS NECK;  Surgeon: Duanne Guess, MD;  Location: [facility] ORS;  Service: General;  Laterality: Right;   EXCISION OF SKIN TAG Bilateral 06/12/2019   Procedure: EXCISION OF SKIN TAG on neck;  Surgeon: Duanne Guess, MD;  Location: [facility] ORS;  Service: General;  Laterality: Bilateral;   LEFT HEART CATH AND CORONARY ANGIOGRAPHY N/A

## 2022-11-15 NOTE — Progress Notes (Signed)
ANTICOAGULATION CONSULT NOTE  Pharmacy Consult for heparin infusion Indication: chest pain/ACS  No Known Allergies  Patient Measurements: Weight: 134.3 kg (296 lb) Heparin Dosing Weight: 107.2 kg  Vital Signs: Temp: 98.1 F (36.7 C) (09/19 1713) Temp Source: Oral (09/19 1713) BP: 145/72 (09/19 1713) Pulse Rate: 79 (09/19 1713)  Labs: Recent Labs    11/12/22 1809 11/12/22 2000 11/12/22 2122 11/13/22 0503 11/13/22 1300 11/14/22 0318 11/14/22 0955 11/14/22 2109 11/15/22 0313 11/15/22 1001 11/15/22 1609  HGB 12.9*  --   --  12.4*  --  11.8*  --   --  12.3*  --   --   HCT 40.9  --   --  39.2  --  37.5*  --   --  40.3  --   --   PLT 220  --   --  215  --  221  --   --  229  --   --   APTT  --  28  --   --   --   --   --   --   --   --   --   LABPROT  --  13.4  --   --   --   --   --   --   --   --   --   INR  --  1.0  --   --   --   --   --   --   --   --   --   HEPARINUNFRC  --   --   --  0.27*   < > 0.17*   < > 0.36  --  0.33 0.37  CREATININE 1.15  --   --   --   --   --   --  1.18  --   --   --   TROPONINIHS 762*  --  593*  --   --   --   --   --   --   --   --    < > = values in this interval not displayed.    Estimated Creatinine Clearance: 103.9 mL/min (by C-G formula based on SCr of 1.18 mg/dL).   Medical History: Past Medical History:  Diagnosis Date   GERD (gastroesophageal reflux disease)    no meds   Hypertension    No pertinent past medical history     Assessment: 52 y.o. male w/ PMH of HTN, GERD presents with chest pain. A review of medical records reveals no chronic anticoagulation prior to arrival. Pharmacy consulted to dose IV heparin. Patient transferred to [facility] for CABG evaluation.  Heparin level came back therapeutic at 0.37, on 1950 units/hr. No s/sx of bleeding or infusion issues.   Goal of Therapy:  Heparin level 0.3-0.7 units/ml Monitor platelets by anticoagulation protocol: Yes  PLAN: Continue heparin infusion at 1950 units/hr CBC  daily while on heparin.  Follow up timing of CABG   Thank you for allowing pharmacy to participate in this patient's care,  Sherron Monday, PharmD, BCCCP Clinical Pharmacist  Phone: [phone] 11/15/2022 6:01 PM  Please check AMION for all [facility] Pharmacy phone numbers After 10:00 PM, call Main Pharmacy [phone]

## 2022-11-16 ENCOUNTER — Inpatient Hospital Stay (HOSPITAL_COMMUNITY): Payer: Commercial Managed Care - PPO

## 2022-11-16 ENCOUNTER — Other Ambulatory Visit (HOSPITAL_COMMUNITY): Payer: Commercial Managed Care - PPO

## 2022-11-16 DIAGNOSIS — Z01810 Encounter for preprocedural cardiovascular examination: Secondary | ICD-10-CM

## 2022-11-16 DIAGNOSIS — I259 Chronic ischemic heart disease, unspecified: Secondary | ICD-10-CM | POA: Diagnosis not present

## 2022-11-16 LAB — BASIC METABOLIC PANEL
Anion gap: 10 (ref 5–15)
BUN: 17 mg/dL (ref 6–20)
CO2: 25 mmol/L (ref 22–32)
Calcium: 9.5 mg/dL (ref 8.9–10.3)
Chloride: 102 mmol/L (ref 98–111)
Creatinine, Ser: 1.01 mg/dL (ref 0.61–1.24)
GFR, Estimated: >60 mL/min (ref >60)
Glucose, Bld: 112 mg/dL — ABNORMAL HIGH (ref 70–99)
Potassium: 4.1 mmol/L (ref 3.5–5.1)
Sodium: 137 mmol/L (ref 135–145)

## 2022-11-16 LAB — HEPARIN LEVEL (UNFRACTIONATED): Heparin Unfractionated: 0.45 IU/mL (ref 0.30–0.70)

## 2022-11-16 LAB — CBC
HCT: 41.1 % (ref 39.0–52.0)
Hemoglobin: 13.1 g/dL (ref 13.0–17.0)
MCH: 26.3 pg (ref 26.0–34.0)
MCHC: 31.9 g/dL (ref 30.0–36.0)
MCV: 82.5 fL (ref 80.0–100.0)
Platelets: 209 10*3/uL (ref 150–400)
RBC: 4.98 MIL/uL (ref 4.22–5.81)
RDW: 13.8 % (ref 11.5–15.5)
WBC: 7.1 10*3/uL (ref 4.0–10.5)
nRBC: 0 % (ref 0.0–0.2)

## 2022-11-16 LAB — LIPOPROTEIN A (LPA): Lipoprotein (a): 82.5 nmol/L — ABNORMAL HIGH (ref <75.0)

## 2022-11-16 MED ORDER — INSULIN REGULAR(HUMAN) IN NACL 100-0.9 UT/100ML-% IV SOLN
INTRAVENOUS | Status: AC
  Administered 2022-11-19: 1.4 [IU]/h via INTRAVENOUS
  Filled 2022-11-16: qty 100

## 2022-11-16 MED ORDER — TRANEXAMIC ACID (OHS) PUMP PRIME SOLUTION
2.0000 mg/kg | INTRAVENOUS | Status: DC
  Filled 2022-11-16 (×2): qty 2.65

## 2022-11-16 MED ORDER — PLASMA-LYTE A IV SOLN
INTRAVENOUS | Status: DC
  Filled 2022-11-16: qty 2.5

## 2022-11-16 MED ORDER — CEFAZOLIN IN SODIUM CHLORIDE 3-0.9 GM/100ML-% IV SOLN
3.0000 g | INTRAVENOUS | Status: AC
  Administered 2022-11-19: 3 g via INTRAVENOUS
  Filled 2022-11-16: qty 100

## 2022-11-16 MED ORDER — DEXMEDETOMIDINE HCL IN NACL 400 MCG/100ML IV SOLN
0.1000 ug/kg/h | INTRAVENOUS | Status: AC
  Administered 2022-11-19: .5 ug/kg/h via INTRAVENOUS
  Filled 2022-11-16: qty 100

## 2022-11-16 MED ORDER — VANCOMYCIN HCL 1500 MG/300ML IV SOLN
1500.0000 mg | INTRAVENOUS | Status: AC
  Administered 2022-11-19: 1500 mg via INTRAVENOUS
  Filled 2022-11-16: qty 300

## 2022-11-16 MED ORDER — MILRINONE LACTATE IN DEXTROSE 20-5 MG/100ML-% IV SOLN
0.3000 ug/kg/min | INTRAVENOUS | Status: DC
  Filled 2022-11-16: qty 100

## 2022-11-16 MED ORDER — EPINEPHRINE HCL 5 MG/250ML IV SOLN IN NS
0.0000 ug/min | INTRAVENOUS | Status: DC
  Filled 2022-11-16: qty 250

## 2022-11-16 MED ORDER — TRANEXAMIC ACID 1000 MG/10ML IV SOLN
1.5000 mg/kg/h | INTRAVENOUS | Status: AC
  Administered 2022-11-19: 1.5 mg/kg/h via INTRAVENOUS
  Filled 2022-11-16: qty 25

## 2022-11-16 MED ORDER — PHENYLEPHRINE HCL-NACL 20-0.9 MG/250ML-% IV SOLN
30.0000 ug/min | INTRAVENOUS | Status: AC
  Administered 2022-11-19: 20 ug/min via INTRAVENOUS
  Filled 2022-11-16: qty 250

## 2022-11-16 MED ORDER — MANNITOL 20 % IV SOLN
INTRAVENOUS | Status: DC
  Filled 2022-11-16: qty 13

## 2022-11-16 MED ORDER — NITROGLYCERIN IN D5W 200-5 MCG/ML-% IV SOLN
2.0000 ug/min | INTRAVENOUS | Status: DC
  Filled 2022-11-16: qty 250

## 2022-11-16 MED ORDER — CEFAZOLIN SODIUM-DEXTROSE 2-4 GM/100ML-% IV SOLN
2.0000 g | INTRAVENOUS | Status: AC
  Administered 2022-11-19: 2 g via INTRAVENOUS
  Filled 2022-11-16: qty 100

## 2022-11-16 MED ORDER — POTASSIUM CHLORIDE 2 MEQ/ML IV SOLN
80.0000 meq | INTRAVENOUS | Status: DC
  Filled 2022-11-16: qty 40

## 2022-11-16 MED ORDER — TRANEXAMIC ACID (OHS) BOLUS VIA INFUSION
15.0000 mg/kg | INTRAVENOUS | Status: AC
  Administered 2022-11-19: 1987.5 mg via INTRAVENOUS
  Filled 2022-11-16: qty 1988

## 2022-11-16 MED ORDER — HEPARIN 30,000 UNITS/1000 ML (OHS) CELLSAVER SOLUTION
Status: DC
  Filled 2022-11-16: qty 1000

## 2022-11-16 MED ORDER — NOREPINEPHRINE 4 MG/250ML-% IV SOLN
0.0000 ug/min | INTRAVENOUS | Status: DC
  Filled 2022-11-16: qty 250

## 2022-11-16 NOTE — Progress Notes (Signed)
ANTICOAGULATION CONSULT NOTE- follow up  Pharmacy Consult for heparin infusion Indication: chest pain/ACS  No Known Allergies  Patient Measurements: Weight: 132.5 kg (292 lb 1.8 oz) Heparin Dosing Weight: 107.2 kg  Vital Signs: Temp: 98 F (36.7 C) (09/20 0316) Temp Source: Oral (09/20 0316) BP: 157/79 (09/20 0316) Pulse Rate: 75 (09/20 0316)  Labs: Recent Labs    11/14/22 0318 11/14/22 0955 11/14/22 2109 11/15/22 0313 11/15/22 1001 11/15/22 1609 11/16/22 0428  HGB 11.8*  --   --  12.3*  --   --  13.1  HCT 37.5*  --   --  40.3  --   --  41.1  PLT 221  --   --  229  --   --  209  HEPARINUNFRC 0.17*   < > 0.36  --  0.33 0.37 0.45  CREATININE  --   --  1.18  --   --   --  1.01   < > = values in this interval not displayed.    Estimated Creatinine Clearance: 120.5 mL/min (by C-G formula based on SCr of 1.01 mg/dL).   Medical History: Past Medical History:  Diagnosis Date   GERD (gastroesophageal reflux disease)    no meds   Hypertension    No pertinent past medical history     Assessment: 52 y.o. male w/ PMH of HTN, GERD presents with chest pain. A review of medical records reveals no chronic anticoagulation prior to arrival. Pharmacy consulted to dose IV heparin. Patient transferred to [facility] for CABG evaluation.  9/20 AM update: HL 0.45, Hgb / PLT wnl No signs of bleeding per nursing  Goal of Therapy:  Heparin level 0.3-0.7 units/ml Monitor platelets by anticoagulation protocol: Yes  PLAN: Continue heparin infusion at 1950 units/hr CBC daily while on heparin.  CABG scheduled 9/23 0715  Thank you for allowing pharmacy to participate in this patient's care,  Greta Doom BS, PharmD, BCPS Clinical Pharmacist 11/16/2022 10:08 AM  Contact: [phone] after 3 PM  "Be curious, not judgmental..." -Debbora Dus

## 2022-11-16 NOTE — Progress Notes (Signed)
   11/16/22 1316  TOC Brief Assessment  Insurance and Status Reviewed (UHC / UMR / UHC / PPO)  Patient has primary care physician No (Patient states that he is scheduled for a new PCP appointment in November and that Dr Cliffton Asters said thier office may set him up with one after his upcoming surgery)  Home environment has been reviewed From Home alone   Sister is coming to stayt for a month after DC  Prior level of function: independent  Prior/Current Home Services No current home services  Social Determinants of Health Reivew SDOH reviewed no interventions necessary  Readmission risk has been reviewed Yes (10%)  Transition of care needs no transition of care needs at this time (TOC will continue to follow for any recommendations  There asre no orders at this time for a PT/OT eval.  Do not anticipate any TOC needs)

## 2022-11-16 NOTE — Progress Notes (Signed)
CARDIAC REHAB PHASE I   PRE:  Rate/Rhythm: 89 SR    BP: sitting 159/83    SpO2:   MODE:  Ambulation: 420 ft   POST:  Rate/Rhythm: 106 ST    BP: sitting 179/88     SpO2:   Tolerated well, no c/o. BP elevated. Discussed with pt IS (2500 + ml), sternal precautions, mobility post op and d/c planning. Pt very receptive. Gave him materials to view. He can mobilize following sternal precautions currently. Will walk over weekend. His sister is coming to stay with him for a month at d/c.  [id number]   Ethelda Chick BS, ACSM-CEP 11/16/2022 11:41 AM

## 2022-11-16 NOTE — Progress Notes (Signed)
Pre-CABG study completed. Please see CV Procedures for preliminary results.  Shona Simpson, RVT 11/16/22 3:05 PM

## 2022-11-16 NOTE — Progress Notes (Addendum)
Progress Note  Patient Name: Alexander Simon Date of Encounter: 11/16/2022  Primary Cardiologist: Yvonne Kendall, MD  Subjective   In good spirits. No CP or SOB. Knee pain completely resolved - feels great.  Inpatient Medications    Scheduled Meds:  aspirin EC  81 mg Oral Daily   atorvastatin  80 mg Oral Daily   carvedilol  6.25 mg Oral BID WC   sodium chloride flush  3 mL Intravenous Q12H   Continuous Infusions:  sodium chloride     heparin 1,950 Units/hr (11/16/22 0907)   PRN Meds: sodium chloride, acetaminophen, diclofenac Sodium, nitroGLYCERIN, ondansetron (ZOFRAN) IV, sodium chloride flush   Vital Signs    Vitals:   11/15/22 2025 11/16/22 0007 11/16/22 0316 11/16/22 0837  BP: (!) 156/76 (!) 151/83 (!) 157/79 (!) 117/96  Pulse: 76 82 75 73  Resp: 15 12 19 18   Temp: 98.4 F (36.9 C) 98.2 F (36.8 C) 98 F (36.7 C) 98.4 F (36.9 C)  TempSrc: Oral Oral Oral Oral  SpO2: 93% 95% 97% 99%  Weight:   132.5 kg     Intake/Output Summary (Last 24 hours) at 11/16/2022 0935 Last data filed at 11/16/2022 0600 Gross per 24 hour  Intake 549.48 ml  Output --  Net 549.48 ml      11/16/2022    3:16 AM 11/15/2022    5:00 AM 11/13/2022    2:25 PM  Last 3 Weights  Weight (lbs) 292 lb 1.8 oz 296 lb 291 lb  Weight (kg) 132.5 kg 134.265 kg 131.997 kg     Telemetry    NSR - Personally Reviewed  ECG    NSR 77bpm ST upsloping V1V2 with diffuse TWI though stable from prior - Personally Reviewed  Physical Exam   GEN: No acute distress.  HEENT: Normocephalic, atraumatic, sclera non-icteric. Neck: No JVD or bruits. Cardiac: RRR no murmurs, rubs, or gallops.  Respiratory: Clear to auscultation bilaterally. Breathing is unlabored. GI: Soft, nontender, non-distended, BS +x 4. MS: no deformity. Extremities: No clubbing or cyanosis. No edema. Distal pedal pulses are 2+ and equal bilaterally. Neuro:  AAOx3. Follows commands. Psych:  Responds to questions appropriately with a  normal affect.  Labs    High Sensitivity Troponin:   Recent Labs  Lab 11/12/22 1809 11/12/22 2122  TROPONINIHS 762* 593*      Cardiac EnzymesNo results for input(s): "TROPONINI" in the last 168 hours. No results for input(s): "TROPIPOC" in the last 168 hours.   Chemistry Recent Labs  Lab 11/12/22 1809 11/12/22 2000 11/14/22 2109 11/16/22 0428  NA 137  --  139 137  K 4.4  --  4.1 4.1  CL 103  --  106 102  CO2 27  --  28 25  GLUCOSE 91  --  104* 112*  BUN 15  --  16 17  CREATININE 1.15  --  1.18 1.01  CALCIUM 9.3  --  9.1 9.5  PROT  --  7.9  --   --   ALBUMIN  --  4.0  --   --   AST  --  23  --   --   ALT  --  34  --   --   ALKPHOS  --  50  --   --   BILITOT  --  0.9  --   --   GFRNONAA >60  --  >60 >60  ANIONGAP 7  --  5 10     Hematology Recent Labs  Lab  11/14/22 0318 11/15/22 0313 11/16/22 0428  WBC 6.7 7.8 7.1  RBC 4.63 4.90 4.98  HGB 11.8* 12.3* 13.1  HCT 37.5* 40.3 41.1  MCV 81.0 82.2 82.5  MCH 25.5* 25.1* 26.3  MCHC 31.5 30.5 31.9  RDW 13.5 13.7 13.8  PLT 221 229 209    BNPNo results for input(s): "BNP", "PROBNP" in the last 168 hours.   DDimer  Recent Labs  Lab 11/12/22 2029  DDIMER 0.28     Radiology    DG Knee 4 Views W/Patella Left  Result Date: 11/15/2022 CLINICAL DATA:  Knee pain EXAM: LEFT KNEE - COMPLETE 4+ VIEW COMPARISON:  None Available. FINDINGS: There is no acute fracture or dislocation. Knee alignment is normal. There is mild tricompartmental degenerative change. There is no erosive change. The soft tissues are unremarkable. There is no effusion. IMPRESSION: Mild tricompartmental osteoarthritis.  No acute finding. Electronically Signed   By: Lesia Hausen M.D.   On: 11/15/2022 14:38    Cardiac Studies   2d echo 11/13/22   1. Left ventricular ejection fraction, by estimation, is 45 to 50%. The  left ventricle has mildly decreased function. The left ventricle  demonstrates regional wall motion abnormalities (see scoring   diagram/findings for description). There is moderate  left ventricular hypertrophy. Left ventricular diastolic parameters are  consistent with Grade II diastolic dysfunction (pseudonormalization).  Elevated left atrial pressure. There is severe hypokinesis of the left  ventricular, basal-mid inferior wall and  inferolateral wall.   2. Right ventricular systolic function is normal. The right ventricular  size is normal. Tricuspid regurgitation signal is inadequate for assessing  PA pressure.   3. The mitral valve is degenerative. Trivial mitral valve regurgitation.  No evidence of mitral stenosis.   4. The aortic valve was not well visualized. Aortic valve regurgitation  is not visualized. No aortic stenosis is present.    Cath 11/13/22 Multivessel CAD, including 50% ostial LMCA stenosis, mild luminal irregularities involving the LAD, 90% ostial and mid/distal LCx lesions, and sequential 50-60% RCA stenoses. LVEDP 18 mmHg.   Recommendations: Transfer to [facility] for CABG evaluation.  Ostial LCx lesion would be very difficult to treat percutaneously due to angulation of the vessel relative to the LMCA and LAD.  Additionally, there is at least moderate, bordering on severe ostial LMCA and sequential mid/distal RCA disease. Resume heparin infusion 2 hours after TR band removal. Aggressive secondary prevention of CAD.   Yvonne Kendall, MD [facility]  Patient Profile     52 y.o. 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   Assessment & Plan    1. CAD, NSTEMI - cath as above, posted for CABG on Monday tentatively 11/19/22 - continue ASA 81mg , atorvastatin 80mg  daily, carvedilol 6.25mg  BID, heparin per pharmacy - anticipate will need Plavix at discharge post CABG if no bleeding concerns/stable labs as part of med rx for NSTEMI - pre CABG dopplers pending   2. 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 HLD - LDL 103, trig 203 -> started on atorvastatin 80mg  daily at [facility] - if the patient is tolerating statin at time of follow-up appointment, would consider rechecking  liver function/lipid panel in 6-8 weeks.   4. Pre-DM - A1c 6.2, to follow with PCP after DC   5. Left knee pain - plain films 11/15/22 unremarkable aside from arthritis - has voltaren gel ordered PRN but has not had to use this - ice PRN if needed, no longer than 15 mins at at time - completely resolved this AM, also able to weight bear without issue  6. Nocturnal bradycardia seen earlier this admission, obesity - consider OP eval for OSA  For questions or updates, please contact [facility] Please consult [email or web address] for contact info under Cardiology/STEMI.  Signed, Laurann Montana, PA-C 11/16/2022, 9:35 AM    Patient seen and examined with Lucile Crater, PA-C.  Agree as above, with the following exceptions and changes as noted below.  Patient feels well with no chest pain or shortness of breath.  Has not had chest pain since he was at urgent care on presentation.  Reports to PA on that his knee pain has improved. Gen: NAD, CV: RRR, no murmurs, Lungs: clear, Abd: soft, Extrem: Warm, well perfused, no edema, Neuro/Psych: alert and oriented x 3, normal mood and affect. All available labs, radiology testing, previous records reviewed. Anticipating CABG on Monday. Labs stable.   Parke Poisson, MD 11/16/22 9:46 AM

## 2022-11-17 DIAGNOSIS — I259 Chronic ischemic heart disease, unspecified: Secondary | ICD-10-CM | POA: Diagnosis not present

## 2022-11-17 LAB — CBC
HCT: 40.6 % (ref 39.0–52.0)
Hemoglobin: 12.7 g/dL — ABNORMAL LOW (ref 13.0–17.0)
MCH: 25.1 pg — ABNORMAL LOW (ref 26.0–34.0)
MCHC: 31.3 g/dL (ref 30.0–36.0)
MCV: 80.4 fL (ref 80.0–100.0)
Platelets: 217 10*3/uL (ref 150–400)
RBC: 5.05 MIL/uL (ref 4.22–5.81)
RDW: 13.6 % (ref 11.5–15.5)
WBC: 6.4 10*3/uL (ref 4.0–10.5)
nRBC: 0 % (ref 0.0–0.2)

## 2022-11-17 LAB — HEPARIN LEVEL (UNFRACTIONATED): Heparin Unfractionated: 0.47 IU/mL (ref 0.30–0.70)

## 2022-11-17 LAB — BASIC METABOLIC PANEL
Anion gap: 6 (ref 5–15)
BUN: 15 mg/dL (ref 6–20)
CO2: 24 mmol/L (ref 22–32)
Calcium: 9.2 mg/dL (ref 8.9–10.3)
Chloride: 105 mmol/L (ref 98–111)
Creatinine, Ser: 1.08 mg/dL (ref 0.61–1.24)
GFR, Estimated: >60 mL/min (ref >60)
Glucose, Bld: 130 mg/dL — ABNORMAL HIGH (ref 70–99)
Potassium: 3.9 mmol/L (ref 3.5–5.1)
Sodium: 135 mmol/L (ref 135–145)

## 2022-11-17 NOTE — Progress Notes (Signed)
Progress Note  Patient Name: Alexander Simon Date of Encounter: 11/17/2022  Primary Cardiologist: Yvonne Kendall, MD  Subjective   In good spirits. No CP or SOB. Knee pain completely resolved - feels great.  Inpatient Medications    Scheduled Meds:  aspirin EC  81 mg Oral Daily   atorvastatin  80 mg Oral Daily   carvedilol  6.25 mg Oral BID WC   [START ON 11/19/2022] epinephrine  0-10 mcg/min Intravenous To OR   [START ON 11/19/2022] heparin sodium (porcine) 2,500 Units, papaverine 30 mg in electrolyte-A (PLASMALYTE-A PH 7.4) 500 mL irrigation   Irrigation To OR   [START ON 11/19/2022] insulin   Intravenous To OR   [START ON 11/19/2022] [place] Blood Cardioplegia vial (lidocaine/magnesium/mannitol 0.26g-4g-6.4g)   Intracoronary To OR   [START ON 11/19/2022] phenylephrine  30-200 mcg/min Intravenous To OR   [START ON 11/19/2022] potassium chloride  80 mEq Other To OR   sodium chloride flush  3 mL Intravenous Q12H   [START ON 11/19/2022] tranexamic acid  15 mg/kg Intravenous To OR   [START ON 11/19/2022] tranexamic acid  2 mg/kg Intracatheter To OR   Continuous Infusions:  sodium chloride     [START ON 11/19/2022]  ceFAZolin (ANCEF) IV     [START ON 11/19/2022]  ceFAZolin (ANCEF) IV     [START ON 11/19/2022] dexmedetomidine     [START ON 11/19/2022] heparin 30,000 units/NS 1000 mL solution for CELLSAVER     heparin 1,950 Units/hr (11/17/22 1156)   [START ON 11/19/2022] milrinone     [START ON 11/19/2022] nitroGLYCERIN     [START ON 11/19/2022] norepinephrine     [START ON 11/19/2022] tranexamic acid (CYKLOKAPRON) 2,500 mg in sodium chloride 0.9 % 250 mL (10 mg/mL) infusion     [START ON 11/19/2022] vancomycin     PRN Meds: sodium chloride, acetaminophen, diclofenac Sodium, nitroGLYCERIN, ondansetron (ZOFRAN) IV, sodium chloride flush   Vital Signs    Vitals:   11/17/22 0532 11/17/22 0820 11/17/22 0840 11/17/22 1110  BP: (!) 143/79 (!) 144/87 (!) 161/95 (!) 151/66  Pulse: 72 73 89 84   Resp: 18 (!) 22  20  Temp: 98.2 F (36.8 C) 98.3 F (36.8 C)  98.1 F (36.7 C)  TempSrc: Oral Oral  Oral  SpO2: 96% 97%  96%  Weight: 132.2 kg       Intake/Output Summary (Last 24 hours) at 11/17/2022 1251 Last data filed at 11/17/2022 0200 Gross per 24 hour  Intake 1107.06 ml  Output --  Net 1107.06 ml      11/17/2022    5:32 AM 11/16/2022    3:16 AM 11/15/2022    5:00 AM  Last 3 Weights  Weight (lbs) 291 lb 6.4 oz 292 lb 1.8 oz 296 lb  Weight (kg) 132.178 kg 132.5 kg 134.265 kg     Telemetry    NSR - Personally Reviewed  ECG    NSR 77bpm ST upsloping V1V2 with diffuse TWI though stable from prior - Personally Reviewed  Physical Exam   Gen: Appears comfortable, well-nourished CV: RRR, no dependent edema No M/R/G Pulm: breathing easily   Labs    High Sensitivity Troponin:   Recent Labs  Lab 11/12/22 1809 11/12/22 2122  TROPONINIHS 762* 593*      Cardiac EnzymesNo results for input(s): "TROPONINI" in the last 168 hours. No results for input(s): "TROPIPOC" in the last 168 hours.   Chemistry Recent Labs  Lab 11/12/22 2000 11/14/22 2109 11/16/22 0428 11/17/22 0333  NA  --  139 137 135  K  --  4.1 4.1 3.9  CL  --  106 102 105  CO2  --  28 25 24   GLUCOSE  --  104* 112* 130*  BUN  --  16 17 15   CREATININE  --  1.18 1.01 1.08  CALCIUM  --  9.1 9.5 9.2  PROT 7.9  --   --   --   ALBUMIN 4.0  --   --   --   AST 23  --   --   --   ALT 34  --   --   --   ALKPHOS 50  --   --   --   BILITOT 0.9  --   --   --   GFRNONAA  --  >60 >60 >60  ANIONGAP  --  5 10 6      Hematology Recent Labs  Lab 11/15/22 0313 11/16/22 0428 11/17/22 0333  WBC 7.8 7.1 6.4  RBC 4.90 4.98 5.05  HGB 12.3* 13.1 12.7*  HCT 40.3 41.1 40.6  MCV 82.2 82.5 80.4  MCH 25.1* 26.3 25.1*  MCHC 30.5 31.9 31.3  RDW 13.7 13.8 13.6  PLT 229 209 217    BNPNo results for input(s): "BNP", "PROBNP" in the last 168 hours.   DDimer  Recent Labs  Lab 11/12/22 2029  DDIMER 0.28      Radiology    VAS US DOPPLER PRE CABG  Result Date: 11/17/2022 PREOPERATIVE VASCULAR EVALUATION Patient Name:  Alexander Simon  Date of Exam:   11/16/2022 Medical Rec #: [id number]       Accession #:    [id number] Date of Birth: Jul 23, 1970       Patient Gender: M Patient Age:   52 years Exam Location:  [facility] Procedure:      VAS US DOPPLER PRE CABG Referring Phys: HARRELL LIGHTFOOT --------------------------------------------------------------------------------  Indications:  Pre-CABG. Risk Factors: Hypertension, hyperlipidemia, coronary artery disease. Performing Technologist: Shona Simpson  Examination Guidelines: A complete evaluation includes B-mode imaging, spectral Doppler, color Doppler, and power Doppler as needed of all accessible portions of each vessel. Bilateral testing is considered an integral part of a complete examination. Limited examinations for reoccurring indications may be performed as noted.  Right Carotid Findings: +----------+--------+--------+--------+-----------+--------+           PSV cm/sEDV cm/sStenosisDescribe   Comments +----------+--------+--------+--------+-----------+--------+ CCA Prox  168     32                                  +----------+--------+--------+--------+-----------+--------+ CCA Distal130     29                                  +----------+--------+--------+--------+-----------+--------+ ICA Prox  134     37      1-39%   hyperechoic         +----------+--------+--------+--------+-----------+--------+ ICA Mid   115     41                                  +----------+--------+--------+--------+-----------+--------+ ICA Distal119     27                                  +----------+--------+--------+--------+-----------+--------+  ECA       269     25                                  +----------+--------+--------+--------+-----------+--------+ +----------+--------+-------+--------+------------+            PSV cm/sEDV cmsDescribeArm Pressure +----------+--------+-------+--------+------------+ Subclavian162     0                           +----------+--------+-------+--------+------------+ +---------+--------+--+--------+-+ VertebralPSV cm/s90EDV cm/s0 +---------+--------+--+--------+-+ Elevated ICA/ECA velocities likely due to elevated CCA velocity Left Carotid Findings: +----------+--------+--------+--------+--------+------------------+           PSV cm/sEDV cm/sStenosisDescribeComments           +----------+--------+--------+--------+--------+------------------+ CCA Prox  205     35                                         +----------+--------+--------+--------+--------+------------------+ CCA Distal125     27                      intimal thickening +----------+--------+--------+--------+--------+------------------+ ICA Prox  151     35      1-39%   smooth                     +----------+--------+--------+--------+--------+------------------+ ICA Mid   124     38                                         +----------+--------+--------+--------+--------+------------------+ ICA Distal156     42                                         +----------+--------+--------+--------+--------+------------------+ ECA       325     19      >50%                               +----------+--------+--------+--------+--------+------------------+ +----------+--------+--------+--------+------------+ SubclavianPSV cm/sEDV cm/sDescribeArm Pressure +----------+--------+--------+--------+------------+           308     0                            +----------+--------+--------+--------+------------+ +---------+--------+--+--------+--+ VertebralPSV cm/s62EDV cm/s13 +---------+--------+--+--------+--+ Elevated ICA/ECA velocities likely due to elevated CCA velocity ABI Findings: +--------+------------------+-----+---------+--------+ Right   Rt Pressure  (mmHg)IndexWaveform Comment  +--------+------------------+-----+---------+--------+ [id number]                    triphasic         +--------+------------------+-----+---------+--------+ PTA                            biphasic          +--------+------------------+-----+---------+--------+ DP                             biphasic          +--------+------------------+-----+---------+--------+ +--------+------------------+-----+---------+-------+ Left    Lt  Pressure (mmHg)IndexWaveform Comment +--------+------------------+-----+---------+-------+ [id number]                    triphasic        +--------+------------------+-----+---------+-------+ PTA                            biphasic         +--------+------------------+-----+---------+-------+ DP                             biphasic         +--------+------------------+-----+---------+-------+  Right Doppler Findings: +--------+--------+-----+---------+--------+ Site    PressureIndexDoppler  Comments +--------+--------+-----+---------+--------+ [id number]          triphasic         +--------+--------+-----+---------+--------+  Left Doppler Findings: +--------+--------+-----+---------+--------+ Site    PressureIndexDoppler  Comments +--------+--------+-----+---------+--------+ [id number]          triphasic         +--------+--------+-----+---------+--------+   Summary: Right Carotid: Velocities in the right ICA are consistent with a 1-39% stenosis. Left Carotid: Velocities in the left ICA are consistent with a 1-39% stenosis.               The ECA appears <50% stenosed. Vertebrals:  Bilateral vertebral arteries demonstrate antegrade flow. Subclavians: Left subclavian artery was stenotic. Normal flow hemodynamics were              seen in the right subclavian artery. Right Upper Extremity: Doppler waveforms decrease >50% with right radial compression. Doppler waveforms decrease >50% with right  ulnar compression. Left Upper Extremity: Doppler waveforms decrease >50% with left radial compression. Doppler waveforms remain within normal limits with left ulnar compression.  Electronically signed by Coral Else MD on 11/17/2022 at 10:05:53 AM.    Final     Cardiac Studies   2d echo 11/13/22   1. Left ventricular ejection fraction, by estimation, is 45 to 50%. The  left ventricle has mildly decreased function. The left ventricle  demonstrates regional wall motion abnormalities (see scoring  diagram/findings for description). There is moderate  left ventricular hypertrophy. Left ventricular diastolic parameters are  consistent with Grade II diastolic dysfunction (pseudonormalization).  Elevated left atrial pressure. There is severe hypokinesis of the left  ventricular, basal-mid inferior wall and  inferolateral wall.   2. Right ventricular systolic function is normal. The right ventricular  size is normal. Tricuspid regurgitation signal is inadequate for assessing  PA pressure.   3. The mitral valve is degenerative. Trivial mitral valve regurgitation.  No evidence of mitral stenosis.   4. The aortic valve was not well visualized. Aortic valve regurgitation  is not visualized. No aortic stenosis is present.    Cath 11/13/22 Multivessel CAD, including 50% ostial LMCA stenosis, mild luminal irregularities involving the LAD, 90% ostial and mid/distal LCx lesions, and sequential 50-60% RCA stenoses. LVEDP 18 mmHg.   Recommendations: Transfer to [facility] for CABG evaluation.  Ostial LCx lesion would be very difficult to treat percutaneously due to angulation of the vessel relative to the LMCA and LAD.  Additionally, there is at least moderate, bordering on severe ostial LMCA and sequential mid/distal RCA disease. Resume heparin infusion 2 hours after TR band removal. Aggressive secondary prevention of CAD.   Yvonne Kendall, MD [facility]  Patient Profile     52 y.o. male with  HTN, obesity, pre-DM, GERd admitted to [facility] 11/12/22 with NSTEMI, found to have  MVCAD therefore transferred to [facility] for consideration of CABG.   Assessment & Plan    1. CAD, NSTEMI - cath as above, posted for CABG on Monday tentatively 11/19/22 - continue ASA 81mg , atorvastatin 80mg  daily, carvedilol 6.25mg  BID, heparin per pharmacy - anticipate will need Plavix at discharge post CABG if no bleeding concerns/stable labs as part of med rx for NSTEMI - pre CABG dopplers pending   2. 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 HLD - LDL 103, trig 203 -> started on atorvastatin 80mg  daily at [facility] - if the patient is tolerating statin at time of follow-up appointment, would consider rechecking liver function/lipid panel in 6-8 weeks.   4. Pre-DM - A1c 6.2, to follow with PCP after DC   5. Left knee pain - plain films 11/15/22 unremarkable aside from arthritis - has voltaren gel ordered PRN but has not had to use this - ice PRN if needed, no longer than 15 mins at at time - completely resolved this AM, also able to weight bear without issue  6. Nocturnal bradycardia seen earlier this admission, obesity - consider OP eval for OSA  For questions or updates, please contact  [facility] Please consult [email or web address] for contact info under Cardiology/STEMI.  Signed, Maurice Small, MD 11/17/2022, 12:51 PM

## 2022-11-17 NOTE — Progress Notes (Signed)
ANTICOAGULATION CONSULT NOTE- follow up  Pharmacy Consult for heparin infusion Indication: chest pain/ACS  No Known Allergies  Patient Measurements: Weight: 132.2 kg (291 lb 6.4 oz) Heparin Dosing Weight: 107.2 kg  Vital Signs: Temp: 98.2 F (36.8 C) (09/21 0532) Temp Source: Oral (09/21 0532) BP: 143/79 (09/21 0532) Pulse Rate: 72 (09/21 0532)  Labs: Recent Labs     0000 11/14/22 2109 11/15/22 0313 11/15/22 1001 11/15/22 1609 11/16/22 0428 11/17/22 0333  HGB   < >  --  12.3*  --   --  13.1 12.7*  HCT  --   --  40.3  --   --  41.1 40.6  PLT  --   --  229  --   --  209 217  HEPARINUNFRC  --  0.36  --    < > 0.37 0.45 0.47  CREATININE  --  1.18  --   --   --  1.01 1.08   < > = values in this interval not displayed.    Estimated Creatinine Clearance: 112.5 mL/min (by C-G formula based on SCr of 1.08 mg/dL).   Medical History: Past Medical History:  Diagnosis Date   GERD (gastroesophageal reflux disease)    no meds   Hypertension    No pertinent past medical history     Assessment: 52 y.o. male w/ PMH of HTN, GERD presents with chest pain. A review of medical records reveals no chronic anticoagulation prior to arrival. Pharmacy consulted to dose IV heparin. Patient transferred to [facility] for CABG evaluation.  9/21 AM update: HL 0.47, Hgb 12.7/ PLT wnl No signs of bleeding per nursing  Goal of Therapy:  Heparin level 0.3-0.7 units/ml Monitor platelets by anticoagulation protocol: Yes  PLAN: Continue heparin infusion at 1950 units/hr CBC daily while on heparin.  CABG scheduled 9/23 0715  Roslyn Smiling, PharmD PGY1 Pharmacy Resident 11/17/2022 7:31 AM

## 2022-11-18 DIAGNOSIS — I259 Chronic ischemic heart disease, unspecified: Secondary | ICD-10-CM | POA: Diagnosis not present

## 2022-11-18 LAB — BASIC METABOLIC PANEL
Anion gap: 13 (ref 5–15)
BUN: 18 mg/dL (ref 6–20)
CO2: 22 mmol/L (ref 22–32)
Calcium: 9.6 mg/dL (ref 8.9–10.3)
Chloride: 101 mmol/L (ref 98–111)
Creatinine, Ser: 1.12 mg/dL (ref 0.61–1.24)
GFR, Estimated: >60 mL/min (ref >60)
Glucose, Bld: 113 mg/dL — ABNORMAL HIGH (ref 70–99)
Potassium: 4.1 mmol/L (ref 3.5–5.1)
Sodium: 136 mmol/L (ref 135–145)

## 2022-11-18 LAB — BLOOD GAS, ARTERIAL
Acid-Base Excess: 0.8 mmol/L (ref 0.0–2.0)
Bicarbonate: 26 mmol/L (ref 20.0–28.0)
O2 Saturation: 98.1 %
Patient temperature: 37
pCO2 arterial: 43 mmHg (ref 32–48)
pH, Arterial: 7.39 (ref 7.35–7.45)
pO2, Arterial: 98 mmHg (ref 83–108)

## 2022-11-18 LAB — CBC
HCT: 39.7 % (ref 39.0–52.0)
Hemoglobin: 12.8 g/dL — ABNORMAL LOW (ref 13.0–17.0)
MCH: 26 pg (ref 26.0–34.0)
MCHC: 32.2 g/dL (ref 30.0–36.0)
MCV: 80.5 fL (ref 80.0–100.0)
Platelets: 212 10*3/uL (ref 150–400)
RBC: 4.93 MIL/uL (ref 4.22–5.81)
RDW: 13.7 % (ref 11.5–15.5)
WBC: 6.7 10*3/uL (ref 4.0–10.5)
nRBC: 0 % (ref 0.0–0.2)

## 2022-11-18 LAB — URINALYSIS, ROUTINE W REFLEX MICROSCOPIC
Bilirubin Urine: NEGATIVE
Glucose, UA: NEGATIVE mg/dL
Hgb urine dipstick: NEGATIVE
Ketones, ur: NEGATIVE mg/dL
Leukocytes,Ua: NEGATIVE
Nitrite: NEGATIVE
Protein, ur: NEGATIVE mg/dL
Specific Gravity, Urine: 1.015 (ref 1.005–1.030)
pH: 6 (ref 5.0–8.0)

## 2022-11-18 LAB — APTT: aPTT: 72 seconds — ABNORMAL HIGH (ref 24–36)

## 2022-11-18 LAB — PROTIME-INR
INR: 1 (ref 0.8–1.2)
Prothrombin Time: 13.1 seconds (ref 11.4–15.2)

## 2022-11-18 LAB — ABO/RH: ABO/RH(D): O POS

## 2022-11-18 LAB — SURGICAL PCR SCREEN
MRSA, PCR: NEGATIVE
Staphylococcus aureus: NEGATIVE

## 2022-11-18 LAB — HEPARIN LEVEL (UNFRACTIONATED): Heparin Unfractionated: 0.32 IU/mL (ref 0.30–0.70)

## 2022-11-18 LAB — PREPARE RBC (CROSSMATCH)

## 2022-11-18 MED ORDER — CHLORHEXIDINE GLUCONATE CLOTH 2 % EX PADS
6.0000 | MEDICATED_PAD | Freq: Once | CUTANEOUS | Status: AC
  Administered 2022-11-18: 6 via TOPICAL

## 2022-11-18 MED ORDER — BISACODYL 5 MG PO TBEC
5.0000 mg | DELAYED_RELEASE_TABLET | Freq: Once | ORAL | Status: AC
  Administered 2022-11-18: 5 mg via ORAL
  Filled 2022-11-18: qty 1

## 2022-11-18 MED ORDER — CHLORHEXIDINE GLUCONATE CLOTH 2 % EX PADS
6.0000 | MEDICATED_PAD | Freq: Once | CUTANEOUS | Status: AC
  Administered 2022-11-19: 6 via TOPICAL

## 2022-11-18 MED ORDER — CHLORHEXIDINE GLUCONATE 0.12 % MT SOLN
15.0000 mL | Freq: Once | OROMUCOSAL | Status: AC
  Administered 2022-11-18: 15 mL via OROMUCOSAL
  Filled 2022-11-18: qty 15

## 2022-11-18 MED ORDER — TEMAZEPAM 15 MG PO CAPS
15.0000 mg | ORAL_CAPSULE | Freq: Once | ORAL | Status: AC | PRN
  Administered 2022-11-18: 15 mg via ORAL
  Filled 2022-11-18: qty 1

## 2022-11-18 MED ORDER — METOPROLOL TARTRATE 12.5 MG HALF TABLET
12.5000 mg | ORAL_TABLET | Freq: Once | ORAL | Status: DC
  Filled 2022-11-18: qty 1

## 2022-11-18 NOTE — Progress Notes (Signed)
ANTICOAGULATION CONSULT NOTE- follow up  Pharmacy Consult for heparin infusion Indication: chest pain/ACS  No Known Allergies  Patient Measurements: Weight: 134 kg (295 lb 6.7 oz) Heparin Dosing Weight: 107.2 kg  Vital Signs: Temp: 98.7 F (37.1 C) (09/22 0353) Temp Source: Oral (09/22 0353) BP: 138/75 (09/22 0353) Pulse Rate: 81 (09/22 0353)  Labs: Recent Labs    11/16/22 0428 11/17/22 0333 11/18/22 0332  HGB 13.1 12.7*  --   HCT 41.1 40.6  --   PLT 209 217  --   HEPARINUNFRC 0.45 0.47 0.32  CREATININE 1.01 1.08 1.12    Estimated Creatinine Clearance: 109.3 mL/min (by C-G formula based on SCr of 1.12 mg/dL).   Medical History: Past Medical History:  Diagnosis Date   GERD (gastroesophageal reflux disease)    no meds   Hypertension    No pertinent past medical history     Assessment: 52 y.o. male w/ PMH of HTN, GERD presents with chest pain. A review of medical records reveals no chronic anticoagulation prior to arrival. Pharmacy consulted to dose IV heparin. Patient transferred to [facility] for CABG evaluation.  9/22 AM update: HL 0.32, down from 0.47, at 1950 units/hr. Level is therapeutic but on the lower end of goal.  Hgb 12.7/ PLT wnl  No signs of bleeding or issues with the infusion per nursing team   Goal of Therapy:  Heparin level 0.3-0.7 units/ml Monitor platelets by anticoagulation protocol: Yes  PLAN: Increase heparin infusion to 2000 units/hr  CBC and heparin level daily while on heparin CABG scheduled 9/23 0715  Roslyn Smiling, PharmD PGY1 Pharmacy Resident 11/18/2022 7:09 AM

## 2022-11-18 NOTE — Progress Notes (Signed)
Progress Note  Patient Name: Alexander Simon Date of Encounter: 11/18/2022  Primary Cardiologist: Yvonne Kendall, MD  Subjective   In good spirits. No CP or SOB. Knee pain completely resolved - feels great.  Inpatient Medications    Scheduled Meds:  aspirin EC  81 mg Oral Daily   atorvastatin  80 mg Oral Daily   bisacodyl  5 mg Oral Once   carvedilol  6.25 mg Oral BID WC   [START ON 11/19/2022] chlorhexidine  15 mL Mouth/Throat Once   Chlorhexidine Gluconate Cloth  6 each Topical Once   And   Chlorhexidine Gluconate Cloth  6 each Topical Once   [START ON 11/19/2022] epinephrine  0-10 mcg/min Intravenous To OR   [START ON 11/19/2022] heparin sodium (porcine) 2,500 Units, papaverine 30 mg in electrolyte-A (PLASMALYTE-A PH 7.4) 500 mL irrigation   Irrigation To OR   [START ON 11/19/2022] insulin   Intravenous To OR   [START ON 11/19/2022] [place] Blood Cardioplegia vial (lidocaine/magnesium/mannitol 0.26g-4g-6.4g)   Intracoronary To OR   [START ON 11/19/2022] metoprolol tartrate  12.5 mg Oral Once   [START ON 11/19/2022] phenylephrine  30-200 mcg/min Intravenous To OR   [START ON 11/19/2022] potassium chloride  80 mEq Other To OR   sodium chloride flush  3 mL Intravenous Q12H   [START ON 11/19/2022] tranexamic acid  15 mg/kg Intravenous To OR   [START ON 11/19/2022] tranexamic acid  2 mg/kg Intracatheter To OR   Continuous Infusions:  sodium chloride     [START ON 11/19/2022]  ceFAZolin (ANCEF) IV     [START ON 11/19/2022]  ceFAZolin (ANCEF) IV     [START ON 11/19/2022] dexmedetomidine     [START ON 11/19/2022] heparin 30,000 units/NS 1000 mL solution for CELLSAVER     heparin 2,000 Units/hr (11/18/22 0900)   [START ON 11/19/2022] milrinone     [START ON 11/19/2022] nitroGLYCERIN     [START ON 11/19/2022] norepinephrine     [START ON 11/19/2022] tranexamic acid (CYKLOKAPRON) 2,500 mg in sodium chloride 0.9 % 250 mL (10 mg/mL) infusion     [START ON 11/19/2022] vancomycin     PRN  Meds: sodium chloride, acetaminophen, diclofenac Sodium, nitroGLYCERIN, ondansetron (ZOFRAN) IV, sodium chloride flush, temazepam   Vital Signs    Vitals:   11/17/22 2017 11/18/22 0040 11/18/22 0353 11/18/22 0800  BP: (!) 154/93 (!) 158/82 138/75 (!) 155/83  Pulse: 87 75 81 75  Resp: 20 18 16 14   Temp: 98 F (36.7 C) 97.9 F (36.6 C) 98.7 F (37.1 C) 97.9 F (36.6 C)  TempSrc: Oral Oral Oral Oral  SpO2: 96% 98% 96% 92%  Weight:   134 kg     Intake/Output Summary (Last 24 hours) at 11/18/2022 1032 Last data filed at 11/18/2022 0600 Gross per 24 hour  Intake 785.4 ml  Output --  Net 785.4 ml      11/18/2022    3:53 AM 11/17/2022    5:32 AM 11/16/2022    3:16 AM  Last 3 Weights  Weight (lbs) 295 lb 6.7 oz 291 lb 6.4 oz 292 lb 1.8 oz  Weight (kg) 134 kg 132.178 kg 132.5 kg     Telemetry    NSR - Personally Reviewed  ECG    NSR 77bpm ST upsloping V1V2 with diffuse TWI though stable from prior - Personally Reviewed  Physical Exam   Gen: Appears comfortable, well-nourished CV: RRR, no dependent edema No M/R/G Pulm: breathing easily   Labs    High Sensitivity  Troponin:   Recent Labs  Lab 11/12/22 1809 11/12/22 2122  TROPONINIHS 762* 593*      Cardiac EnzymesNo results for input(s): "TROPONINI" in the last 168 hours. No results for input(s): "TROPIPOC" in the last 168 hours.   Chemistry Recent Labs  Lab 11/12/22 2000 11/14/22 2109 11/16/22 0428 11/17/22 0333 11/18/22 0332  NA  --    < > 137 135 136  K  --    < > 4.1 3.9 4.1  CL  --    < > 102 105 101  CO2  --    < > 25 24 22   GLUCOSE  --    < > 112* 130* 113*  BUN  --    < > 17 15 18   CREATININE  --    < > 1.01 1.08 1.12  CALCIUM  --    < > 9.5 9.2 9.6  PROT 7.9  --   --   --   --   ALBUMIN 4.0  --   --   --   --   AST 23  --   --   --   --   ALT 34  --   --   --   --   ALKPHOS 50  --   --   --   --   BILITOT 0.9  --   --   --   --   GFRNONAA  --    < > >60 >60 >60  ANIONGAP  --    < > 10 6 13     < > = values in this interval not displayed.     Hematology Recent Labs  Lab 11/16/22 0428 11/17/22 0333 11/18/22 0740  WBC 7.1 6.4 6.7  RBC 4.98 5.05 4.93  HGB 13.1 12.7* 12.8*  HCT 41.1 40.6 39.7  MCV 82.5 80.4 80.5  MCH 26.3 25.1* 26.0  MCHC 31.9 31.3 32.2  RDW 13.8 13.6 13.7  PLT 209 217 212    BNPNo results for input(s): "BNP", "PROBNP" in the last 168 hours.   DDimer  Recent Labs  Lab 11/12/22 2029  DDIMER 0.28     Radiology    VAS US DOPPLER PRE CABG  Result Date: 11/17/2022 PREOPERATIVE VASCULAR EVALUATION Patient Name:  Alexander Simon  Date of Exam:   11/16/2022 Medical Rec #: [id number]       Accession #:    [id number] Date of Birth: [birth year]       Patient Gender: M Patient Age:   52 years Exam Location:  [facility] Procedure:      VAS US DOPPLER PRE CABG Referring Phys: HARRELL LIGHTFOOT --------------------------------------------------------------------------------  Indications:  Pre-CABG. Risk Factors: Hypertension, hyperlipidemia, coronary artery disease. Performing Technologist: Shona Simpson  Examination Guidelines: A complete evaluation includes B-mode imaging, spectral Doppler, color Doppler, and power Doppler as needed of all accessible portions of each vessel. Bilateral testing is considered an integral part of a complete examination. Limited examinations for reoccurring indications may be performed as noted.  Right Carotid Findings: +----------+--------+--------+--------+-----------+--------+           PSV cm/sEDV cm/sStenosisDescribe   Comments +----------+--------+--------+--------+-----------+--------+ CCA Prox  168     32                                  +----------+--------+--------+--------+-----------+--------+ CCA Distal130     29                                  +----------+--------+--------+--------+-----------+--------+  ICA Prox  134     37      1-39%   hyperechoic          +----------+--------+--------+--------+-----------+--------+ ICA Mid   115     41                                  +----------+--------+--------+--------+-----------+--------+ ICA Distal119     27                                  +----------+--------+--------+--------+-----------+--------+ ECA       269     25                                  +----------+--------+--------+--------+-----------+--------+ +----------+--------+-------+--------+------------+           PSV cm/sEDV cmsDescribeArm Pressure +----------+--------+-------+--------+------------+ Subclavian162     0                           +----------+--------+-------+--------+------------+ +---------+--------+--+--------+-+ VertebralPSV cm/s90EDV cm/s0 +---------+--------+--+--------+-+ Elevated ICA/ECA velocities likely due to elevated CCA velocity Left Carotid Findings: +----------+--------+--------+--------+--------+------------------+           PSV cm/sEDV cm/sStenosisDescribeComments           +----------+--------+--------+--------+--------+------------------+ CCA Prox  205     35                                         +----------+--------+--------+--------+--------+------------------+ CCA Distal125     27                      intimal thickening +----------+--------+--------+--------+--------+------------------+ ICA Prox  151     35      1-39%   smooth                     +----------+--------+--------+--------+--------+------------------+ ICA Mid   124     38                                         +----------+--------+--------+--------+--------+------------------+ ICA Distal156     42                                         +----------+--------+--------+--------+--------+------------------+ ECA       325     19      >50%                               +----------+--------+--------+--------+--------+------------------+  +----------+--------+--------+--------+------------+ SubclavianPSV cm/sEDV cm/sDescribeArm Pressure +----------+--------+--------+--------+------------+           308     0                            +----------+--------+--------+--------+------------+ +---------+--------+--+--------+--+ VertebralPSV cm/s62EDV cm/s13 +---------+--------+--+--------+--+ Elevated ICA/ECA velocities likely due to elevated CCA velocity ABI Findings: +--------+------------------+-----+---------+--------+ Right   Rt Pressure (mmHg)IndexWaveform Comment  +--------+------------------+-----+---------+--------+ [id number]  triphasic         +--------+------------------+-----+---------+--------+ PTA                            biphasic          +--------+------------------+-----+---------+--------+ DP                             biphasic          +--------+------------------+-----+---------+--------+ +--------+------------------+-----+---------+-------+ Left    Lt Pressure (mmHg)IndexWaveform Comment +--------+------------------+-----+---------+-------+ [id number]                    triphasic        +--------+------------------+-----+---------+-------+ PTA                            biphasic         +--------+------------------+-----+---------+-------+ DP                             biphasic         +--------+------------------+-----+---------+-------+  Right Doppler Findings: +--------+--------+-----+---------+--------+ Site    PressureIndexDoppler  Comments +--------+--------+-----+---------+--------+ [id number]          triphasic         +--------+--------+-----+---------+--------+  Left Doppler Findings: +--------+--------+-----+---------+--------+ Site    PressureIndexDoppler  Comments +--------+--------+-----+---------+--------+ [id number]          triphasic         +--------+--------+-----+---------+--------+   Summary: Right  Carotid: Velocities in the right ICA are consistent with a 1-39% stenosis. Left Carotid: Velocities in the left ICA are consistent with a 1-39% stenosis.               The ECA appears <50% stenosed. Vertebrals:  Bilateral vertebral arteries demonstrate antegrade flow. Subclavians: Left subclavian artery was stenotic. Normal flow hemodynamics were              seen in the right subclavian artery. Right Upper Extremity: Doppler waveforms decrease >50% with right radial compression. Doppler waveforms decrease >50% with right ulnar compression. Left Upper Extremity: Doppler waveforms decrease >50% with left radial compression. Doppler waveforms remain within normal limits with left ulnar compression.  Electronically signed by Coral Else MD on 11/17/2022 at 10:05:53 AM.    Final     Cardiac Studies   2d echo 11/13/22   1. Left ventricular ejection fraction, by estimation, is 45 to 50%. The  left ventricle has mildly decreased function. The left ventricle  demonstrates regional wall motion abnormalities (see scoring  diagram/findings for description). There is moderate  left ventricular hypertrophy. Left ventricular diastolic parameters are  consistent with Grade II diastolic dysfunction (pseudonormalization).  Elevated left atrial pressure. There is severe hypokinesis of the left  ventricular, basal-mid inferior wall and  inferolateral wall.   2. Right ventricular systolic function is normal. The right ventricular  size is normal. Tricuspid regurgitation signal is inadequate for assessing  PA pressure.   3. The mitral valve is degenerative. Trivial mitral valve regurgitation.  No evidence of mitral stenosis.   4. The aortic valve was not well visualized. Aortic valve regurgitation  is not visualized. No aortic stenosis is present.    Cath 11/13/22 Multivessel CAD, including 50% ostial LMCA stenosis, mild luminal irregularities involving the LAD, 90% ostial and mid/distal LCx lesions, and  sequential 50-60% RCA stenoses. LVEDP 18 mmHg.  Recommendations: Transfer to [facility] for CABG evaluation.  Ostial LCx lesion would be very difficult to treat percutaneously due to angulation of the vessel relative to the LMCA and LAD.  Additionally, there is at least moderate, bordering on severe ostial LMCA and sequential mid/distal RCA disease. Resume heparin infusion 2 hours after TR band removal. Aggressive secondary prevention of CAD.   Yvonne Kendall, MD [facility]  Patient Profile     52 y.o. 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   Assessment & Plan    1. CAD, NSTEMI - cath as above, posted for CABG on Monday tentatively 11/19/22 - continue ASA 81mg , atorvastatin 80mg  daily, carvedilol 6.25mg  BID, heparin per pharmacy - anticipate will need Plavix at discharge post CABG if no bleeding concerns/stable labs as part of med rx for NSTEMI   2. 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 HLD - LDL 103, trig 203 -> started on atorvastatin 80mg  daily at [facility] - if the patient is tolerating statin at time of follow-up appointment, would consider rechecking liver function/lipid panel in 6-8 weeks.   4. Pre-DM - A1c 6.2, to follow with PCP after DC   5. Left knee pain - plain films 11/15/22 unremarkable aside from arthritis - has voltaren gel ordered PRN but has not had to use this - ice PRN if needed, no longer than 15 mins at at time - completely resolved this AM, also able to weight bear without issue  6. Nocturnal bradycardia seen earlier this admission, obesity - consider OP eval for OSA  For questions or updates, please contact [facility] Please consult [email or web address] for contact info under Cardiology/STEMI.  Signed, Maurice Small, MD 11/18/2022, 10:32 AM

## 2022-11-18 NOTE — Anesthesia Preprocedure Evaluation (Addendum)
Anesthesia Evaluation  Patient identified by MRN, date of birth, ID band Patient awake    Reviewed: Allergy & Precautions, NPO status , Patient's Chart, lab work & pertinent test results, reviewed documented beta blocker date and time   History of Anesthesia Complications Negative for: history of anesthetic complications  Airway Mallampati: II  TM Distance: >3 FB Neck ROM: Full    Dental  (+) Dental Advisory Given, Teeth Intact   Pulmonary neg pulmonary ROS   Pulmonary exam normal        Cardiovascular hypertension, Pt. on home beta blockers and Pt. on medications + CAD, + Past MI and +CHF  Normal cardiovascular exam   '24 Cath - 1. Multivessel CAD, including 50% ostial LMCA stenosis, mild luminal irregularities involving the LAD, 90% ostial and mid/distal LCx lesions, and sequential 50-60% RCA stenoses. 2. LVEDP 18 mmHg.  '24 TTE - EF 45 to 50%. There is moderate left ventricular hypertrophy. Grade II diastolic dysfunction (pseudonormalization). Elevated left atrial pressure. There is severe hypokinesis of the left ventricular, basal-mid inferior wall and inferolateral wall. Trivial MR.      Neuro/Psych negative neurological ROS  negative psych ROS   GI/Hepatic Neg liver ROS,GERD  Controlled and Medicated,,  Endo/Other    Morbid obesity Pre-DM   Renal/GU negative Renal ROS     Musculoskeletal negative musculoskeletal ROS (+)    Abdominal  (+) + obese  Peds  Hematology  (+) Blood dyscrasia, anemia   Anesthesia Other Findings   Reproductive/Obstetrics                             Anesthesia Physical Anesthesia Plan  ASA: 4  Anesthesia Plan: General   Post-op Pain Management: Minimal or no pain anticipated   Induction: Intravenous  PONV Risk Score and Plan: 2 and Treatment may vary due to age or medical condition  Airway Management Planned: Oral ETT  Additional Equipment:  Arterial line, CVP, TEE and Ultrasound Guidance Line Placement  Intra-op Plan:   Post-operative Plan: Post-operative intubation/ventilation  Informed Consent: I have reviewed the patients History and Physical, chart, labs and discussed the procedure including the risks, benefits and alternatives for the proposed anesthesia with the patient or authorized representative who has indicated his/her understanding and acceptance.     Dental advisory given  Plan Discussed with: CRNA and Anesthesiologist  Anesthesia Plan Comments:         Anesthesia Quick Evaluation

## 2022-11-19 ENCOUNTER — Encounter (HOSPITAL_COMMUNITY): Payer: Self-pay | Admitting: Internal Medicine

## 2022-11-19 ENCOUNTER — Inpatient Hospital Stay (HOSPITAL_COMMUNITY): Payer: Commercial Managed Care - PPO

## 2022-11-19 ENCOUNTER — Inpatient Hospital Stay (HOSPITAL_COMMUNITY): Payer: Commercial Managed Care - PPO | Admitting: Certified Registered Nurse Anesthetist

## 2022-11-19 ENCOUNTER — Inpatient Hospital Stay (HOSPITAL_COMMUNITY)
Disposition: A | Discharge status: Home / Self Care | Payer: Self-pay | Source: Other Acute Inpatient Hospital | Attending: Thoracic Surgery (Cardiothoracic Vascular Surgery)

## 2022-11-19 ENCOUNTER — Other Ambulatory Visit: Payer: Self-pay

## 2022-11-19 DIAGNOSIS — I110 Hypertensive heart disease with heart failure: Secondary | ICD-10-CM

## 2022-11-19 DIAGNOSIS — I200 Unstable angina: Secondary | ICD-10-CM

## 2022-11-19 DIAGNOSIS — I509 Heart failure, unspecified: Secondary | ICD-10-CM

## 2022-11-19 DIAGNOSIS — I25118 Atherosclerotic heart disease of native coronary artery with other forms of angina pectoris: Secondary | ICD-10-CM

## 2022-11-19 DIAGNOSIS — E785 Hyperlipidemia, unspecified: Secondary | ICD-10-CM

## 2022-11-19 DIAGNOSIS — I214 Non-ST elevation (NSTEMI) myocardial infarction: Secondary | ICD-10-CM

## 2022-11-19 HISTORY — PX: CORONARY ARTERY BYPASS GRAFT: SHX141

## 2022-11-19 HISTORY — PX: RADIAL ARTERY HARVEST: SHX5067

## 2022-11-19 HISTORY — PX: TEE WITHOUT CARDIOVERSION: SHX5443

## 2022-11-19 LAB — BASIC METABOLIC PANEL
Anion gap: 6 (ref 5–15)
Anion gap: 7 (ref 5–15)
BUN: 12 mg/dL (ref 6–20)
BUN: 17 mg/dL (ref 6–20)
CO2: 24 mmol/L (ref 22–32)
CO2: 27 mmol/L (ref 22–32)
Calcium: 7.6 mg/dL — ABNORMAL LOW (ref 8.9–10.3)
Calcium: 9.6 mg/dL (ref 8.9–10.3)
Chloride: 103 mmol/L (ref 98–111)
Chloride: 107 mmol/L (ref 98–111)
Creatinine, Ser: 0.99 mg/dL (ref 0.61–1.24)
Creatinine, Ser: 1.22 mg/dL (ref 0.61–1.24)
GFR, Estimated: >60 mL/min (ref >60)
GFR, Estimated: >60 mL/min (ref >60)
Glucose, Bld: 109 mg/dL — ABNORMAL HIGH (ref 70–99)
Glucose, Bld: 125 mg/dL — ABNORMAL HIGH (ref 70–99)
Potassium: 4.1 mmol/L (ref 3.5–5.1)
Potassium: 4.5 mmol/L (ref 3.5–5.1)
Sodium: 137 mmol/L (ref 135–145)
Sodium: 137 mmol/L (ref 135–145)

## 2022-11-19 LAB — GLUCOSE, CAPILLARY
Glucose-Capillary: 104 mg/dL — ABNORMAL HIGH (ref 70–99)
Glucose-Capillary: 132 mg/dL — ABNORMAL HIGH (ref 70–99)
Glucose-Capillary: 132 mg/dL — ABNORMAL HIGH (ref 70–99)
Glucose-Capillary: 135 mg/dL — ABNORMAL HIGH (ref 70–99)
Glucose-Capillary: 118 mg/dL — ABNORMAL HIGH (ref 70–99)
Glucose-Capillary: 120 mg/dL — ABNORMAL HIGH (ref 70–99)
Glucose-Capillary: 119 mg/dL — ABNORMAL HIGH (ref 70–99)

## 2022-11-19 LAB — CBC
HCT: 33.6 % — ABNORMAL LOW (ref 39.0–52.0)
HCT: 34.9 % — ABNORMAL LOW (ref 39.0–52.0)
HCT: 39.5 % (ref 39.0–52.0)
Hemoglobin: 10.7 g/dL — ABNORMAL LOW (ref 13.0–17.0)
Hemoglobin: 10.8 g/dL — ABNORMAL LOW (ref 13.0–17.0)
Hemoglobin: 12 g/dL — ABNORMAL LOW (ref 13.0–17.0)
MCH: 25 pg — ABNORMAL LOW (ref 26.0–34.0)
MCH: 25.9 pg — ABNORMAL LOW (ref 26.0–34.0)
MCH: 26.3 pg (ref 26.0–34.0)
MCHC: 30.4 g/dL (ref 30.0–36.0)
MCHC: 30.9 g/dL (ref 30.0–36.0)
MCHC: 31.8 g/dL (ref 30.0–36.0)
MCV: 82.3 fL (ref 80.0–100.0)
MCV: 82.6 fL (ref 80.0–100.0)
MCV: 83.7 fL (ref 80.0–100.0)
Platelets: 157 10*3/uL (ref 150–400)
Platelets: 164 10*3/uL (ref 150–400)
Platelets: 224 10*3/uL (ref 150–400)
RBC: 4.07 MIL/uL — ABNORMAL LOW (ref 4.22–5.81)
RBC: 4.17 MIL/uL — ABNORMAL LOW (ref 4.22–5.81)
RBC: 4.8 MIL/uL (ref 4.22–5.81)
RDW: 13.6 % (ref 11.5–15.5)
RDW: 13.7 % (ref 11.5–15.5)
RDW: 13.7 % (ref 11.5–15.5)
WBC: 11 10*3/uL — ABNORMAL HIGH (ref 4.0–10.5)
WBC: 7.2 10*3/uL (ref 4.0–10.5)
WBC: 9.3 10*3/uL (ref 4.0–10.5)
nRBC: 0 % (ref 0.0–0.2)
nRBC: 0 % (ref 0.0–0.2)
nRBC: 0 % (ref 0.0–0.2)

## 2022-11-19 LAB — POCT I-STAT, CHEM 8
BUN: 15 mg/dL (ref 6–20)
BUN: 15 mg/dL (ref 6–20)
BUN: 15 mg/dL (ref 6–20)
BUN: 16 mg/dL (ref 6–20)
BUN: 15 mg/dL (ref 6–20)
Calcium, Ion: 1.27 mmol/L (ref 1.15–1.40)
Calcium, Ion: 1.21 mmol/L (ref 1.15–1.40)
Calcium, Ion: 1.1 mmol/L — ABNORMAL LOW (ref 1.15–1.40)
Calcium, Ion: 1.12 mmol/L — ABNORMAL LOW (ref 1.15–1.40)
Calcium, Ion: 1.35 mmol/L (ref 1.15–1.40)
Chloride: 106 mmol/L (ref 98–111)
Chloride: 104 mmol/L (ref 98–111)
Chloride: 103 mmol/L (ref 98–111)
Chloride: 103 mmol/L (ref 98–111)
Chloride: 103 mmol/L (ref 98–111)
Creatinine, Ser: 0.9 mg/dL (ref 0.61–1.24)
Creatinine, Ser: 1 mg/dL (ref 0.61–1.24)
Creatinine, Ser: 0.9 mg/dL (ref 0.61–1.24)
Creatinine, Ser: 0.9 mg/dL (ref 0.61–1.24)
Creatinine, Ser: 1 mg/dL (ref 0.61–1.24)
Glucose, Bld: 104 mg/dL — ABNORMAL HIGH (ref 70–99)
Glucose, Bld: 124 mg/dL — ABNORMAL HIGH (ref 70–99)
Glucose, Bld: 138 mg/dL — ABNORMAL HIGH (ref 70–99)
Glucose, Bld: 142 mg/dL — ABNORMAL HIGH (ref 70–99)
Glucose, Bld: 182 mg/dL — ABNORMAL HIGH (ref 70–99)
HCT: 33 % — ABNORMAL LOW (ref 39.0–52.0)
HCT: 35 % — ABNORMAL LOW (ref 39.0–52.0)
HCT: 26 % — ABNORMAL LOW (ref 39.0–52.0)
HCT: 26 % — ABNORMAL LOW (ref 39.0–52.0)
HCT: 26 % — ABNORMAL LOW (ref 39.0–52.0)
Hemoglobin: 11.2 g/dL — ABNORMAL LOW (ref 13.0–17.0)
Hemoglobin: 11.9 g/dL — ABNORMAL LOW (ref 13.0–17.0)
Hemoglobin: 8.8 g/dL — ABNORMAL LOW (ref 13.0–17.0)
Hemoglobin: 8.8 g/dL — ABNORMAL LOW (ref 13.0–17.0)
Hemoglobin: 8.8 g/dL — ABNORMAL LOW (ref 13.0–17.0)
Potassium: 3.9 mmol/L (ref 3.5–5.1)
Potassium: 4.1 mmol/L (ref 3.5–5.1)
Potassium: 5 mmol/L (ref 3.5–5.1)
Potassium: 5.6 mmol/L — ABNORMAL HIGH (ref 3.5–5.1)
Potassium: 4.7 mmol/L (ref 3.5–5.1)
Sodium: 140 mmol/L (ref 135–145)
Sodium: 136 mmol/L (ref 135–145)
Sodium: 135 mmol/L (ref 135–145)
Sodium: 137 mmol/L (ref 135–145)
Sodium: 136 mmol/L (ref 135–145)
TCO2: 22 mmol/L (ref 22–32)
TCO2: 23 mmol/L (ref 22–32)
TCO2: 26 mmol/L (ref 22–32)
TCO2: 27 mmol/L (ref 22–32)
TCO2: 23 mmol/L (ref 22–32)

## 2022-11-19 LAB — POCT I-STAT 7, (LYTES, BLD GAS, ICA,H+H)
Acid-Base Excess: 2 mmol/L (ref 0.0–2.0)
Acid-base deficit: 4 mmol/L — ABNORMAL HIGH (ref 0.0–2.0)
Acid-base deficit: 3 mmol/L — ABNORMAL HIGH (ref 0.0–2.0)
Acid-base deficit: 6 mmol/L — ABNORMAL HIGH (ref 0.0–2.0)
Acid-base deficit: 7 mmol/L — ABNORMAL HIGH (ref 0.0–2.0)
Acid-base deficit: 2 mmol/L (ref 0.0–2.0)
Bicarbonate: 22.5 mmol/L (ref 20.0–28.0)
Bicarbonate: 26.5 mmol/L (ref 20.0–28.0)
Bicarbonate: 23.6 mmol/L (ref 20.0–28.0)
Bicarbonate: 20.9 mmol/L (ref 20.0–28.0)
Bicarbonate: 18.3 mmol/L — ABNORMAL LOW (ref 20.0–28.0)
Bicarbonate: 23 mmol/L (ref 20.0–28.0)
Calcium, Ion: 1.22 mmol/L (ref 1.15–1.40)
Calcium, Ion: 1.08 mmol/L — ABNORMAL LOW (ref 1.15–1.40)
Calcium, Ion: 1.33 mmol/L (ref 1.15–1.40)
Calcium, Ion: 1.22 mmol/L (ref 1.15–1.40)
Calcium, Ion: 1.11 mmol/L — ABNORMAL LOW (ref 1.15–1.40)
Calcium, Ion: 1.15 mmol/L (ref 1.15–1.40)
HCT: 35 % — ABNORMAL LOW (ref 39.0–52.0)
HCT: 28 % — ABNORMAL LOW (ref 39.0–52.0)
HCT: 29 % — ABNORMAL LOW (ref 39.0–52.0)
HCT: 31 % — ABNORMAL LOW (ref 39.0–52.0)
HCT: 29 % — ABNORMAL LOW (ref 39.0–52.0)
HCT: 32 % — ABNORMAL LOW (ref 39.0–52.0)
Hemoglobin: 11.9 g/dL — ABNORMAL LOW (ref 13.0–17.0)
Hemoglobin: 9.5 g/dL — ABNORMAL LOW (ref 13.0–17.0)
Hemoglobin: 9.9 g/dL — ABNORMAL LOW (ref 13.0–17.0)
Hemoglobin: 10.5 g/dL — ABNORMAL LOW (ref 13.0–17.0)
Hemoglobin: 9.9 g/dL — ABNORMAL LOW (ref 13.0–17.0)
Hemoglobin: 10.9 g/dL — ABNORMAL LOW (ref 13.0–17.0)
O2 Saturation: 99 %
O2 Saturation: 100 %
O2 Saturation: 93 %
O2 Saturation: 96 %
O2 Saturation: 95 %
O2 Saturation: 95 %
Patient temperature: 97.9
Patient temperature: 37.5
Patient temperature: 37.9
Potassium: 3.9 mmol/L (ref 3.5–5.1)
Potassium: 4.4 mmol/L (ref 3.5–5.1)
Potassium: 4.6 mmol/L (ref 3.5–5.1)
Potassium: 4.2 mmol/L (ref 3.5–5.1)
Potassium: 4.3 mmol/L (ref 3.5–5.1)
Potassium: 4.5 mmol/L (ref 3.5–5.1)
Sodium: 140 mmol/L (ref 135–145)
Sodium: 139 mmol/L (ref 135–145)
Sodium: 137 mmol/L (ref 135–145)
Sodium: 140 mmol/L (ref 135–145)
Sodium: 142 mmol/L (ref 135–145)
Sodium: 140 mmol/L (ref 135–145)
TCO2: 24 mmol/L (ref 22–32)
TCO2: 28 mmol/L (ref 22–32)
TCO2: 25 mmol/L (ref 22–32)
TCO2: 22 mmol/L (ref 22–32)
TCO2: 19 mmol/L — ABNORMAL LOW (ref 22–32)
TCO2: 24 mmol/L (ref 22–32)
pCO2 arterial: 45.3 mmHg (ref 32–48)
pCO2 arterial: 41.3 mmHg (ref 32–48)
pCO2 arterial: 46.6 mmHg (ref 32–48)
pCO2 arterial: 44.1 mmHg (ref 32–48)
pCO2 arterial: 36.4 mmHg (ref 32–48)
pCO2 arterial: 43.5 mmHg (ref 32–48)
pH, Arterial: 7.304 — ABNORMAL LOW (ref 7.35–7.45)
pH, Arterial: 7.415 (ref 7.35–7.45)
pH, Arterial: 7.312 — ABNORMAL LOW (ref 7.35–7.45)
pH, Arterial: 7.283 — ABNORMAL LOW (ref 7.35–7.45)
pH, Arterial: 7.311 — ABNORMAL LOW (ref 7.35–7.45)
pH, Arterial: 7.336 — ABNORMAL LOW (ref 7.35–7.45)
pO2, Arterial: 133 mmHg — ABNORMAL HIGH (ref 83–108)
pO2, Arterial: 361 mmHg — ABNORMAL HIGH (ref 83–108)
pO2, Arterial: 72 mmHg — ABNORMAL LOW (ref 83–108)
pO2, Arterial: 90 mmHg (ref 83–108)
pO2, Arterial: 85 mmHg (ref 83–108)
pO2, Arterial: 83 mmHg (ref 83–108)

## 2022-11-19 LAB — HEMOGLOBIN AND HEMATOCRIT, BLOOD
HCT: 28.4 % — ABNORMAL LOW (ref 39.0–52.0)
Hemoglobin: 9.1 g/dL — ABNORMAL LOW (ref 13.0–17.0)

## 2022-11-19 LAB — PLATELET COUNT: Platelets: 164 10*3/uL (ref 150–400)

## 2022-11-19 LAB — ECHO INTRAOPERATIVE TEE: Weight: 4726.66 oz

## 2022-11-19 LAB — PROTIME-INR
INR: 1.3 — ABNORMAL HIGH (ref 0.8–1.2)
Prothrombin Time: 16.5 seconds — ABNORMAL HIGH (ref 11.4–15.2)

## 2022-11-19 LAB — APTT: aPTT: 29 seconds (ref 24–36)

## 2022-11-19 LAB — MAGNESIUM: Magnesium: 2.8 mg/dL — ABNORMAL HIGH (ref 1.7–2.4)

## 2022-11-19 SURGERY — CORONARY ARTERY BYPASS GRAFTING (CABG)
Anesthesia: General | Site: Chest

## 2022-11-19 MED ORDER — MAGNESIUM SULFATE 4 GM/100ML IV SOLN
4.0000 g | Freq: Once | INTRAVENOUS | Status: AC
  Administered 2022-11-19: 4 g via INTRAVENOUS
  Filled 2022-11-19: qty 100

## 2022-11-19 MED ORDER — ONDANSETRON HCL 4 MG/2ML IJ SOLN: 4.0000 mg | Freq: Four times a day (QID) | INTRAMUSCULAR | Status: DC | PRN

## 2022-11-19 MED ORDER — FENTANYL CITRATE (PF) 250 MCG/5ML IJ SOLN
INTRAMUSCULAR | Status: AC
  Filled 2022-11-19: qty 5

## 2022-11-19 MED ORDER — BISACODYL 10 MG RE SUPP: 10.0000 mg | Freq: Every day | RECTAL | Status: DC

## 2022-11-19 MED ORDER — POTASSIUM CHLORIDE 10 MEQ/50ML IV SOLN: 10.0000 meq | INTRAVENOUS | Status: AC

## 2022-11-19 MED ORDER — ORAL CARE MOUTH RINSE: 15.0000 mL | OROMUCOSAL | Status: DC | PRN

## 2022-11-19 MED ORDER — PHENYLEPHRINE 80 MCG/ML (10ML) SYRINGE FOR IV PUSH (FOR BLOOD PRESSURE SUPPORT)
PREFILLED_SYRINGE | INTRAVENOUS | Status: AC
  Filled 2022-11-19: qty 10

## 2022-11-19 MED ORDER — TRAMADOL HCL 50 MG PO TABS
50.0000 mg | ORAL_TABLET | ORAL | Status: DC | PRN
  Administered 2022-11-20 – 2022-11-23 (×5): 100 mg via ORAL
  Filled 2022-11-19 (×6): qty 2

## 2022-11-19 MED ORDER — ALBUMIN HUMAN 5 % IV SOLN
250.0000 mL | INTRAVENOUS | Status: DC | PRN
  Administered 2022-11-19 (×4): 12.5 g via INTRAVENOUS
  Filled 2022-11-19 (×2): qty 250

## 2022-11-19 MED ORDER — PHENYLEPHRINE 80 MCG/ML (10ML) SYRINGE FOR IV PUSH (FOR BLOOD PRESSURE SUPPORT)
PREFILLED_SYRINGE | INTRAVENOUS | Status: DC | PRN
  Administered 2022-11-19: 160 ug via INTRAVENOUS
  Administered 2022-11-19: 240 ug via INTRAVENOUS
  Administered 2022-11-19 (×2): 160 ug via INTRAVENOUS
  Administered 2022-11-19: 80 ug via INTRAVENOUS
  Administered 2022-11-19 (×3): 160 ug via INTRAVENOUS

## 2022-11-19 MED ORDER — LACTATED RINGERS IV SOLN: INTRAVENOUS | Status: DC

## 2022-11-19 MED ORDER — BISACODYL 5 MG PO TBEC
10.0000 mg | DELAYED_RELEASE_TABLET | Freq: Every day | ORAL | Status: DC
  Administered 2022-11-20 – 2022-11-24 (×5): 10 mg via ORAL
  Filled 2022-11-19 (×6): qty 2

## 2022-11-19 MED ORDER — CEFAZOLIN SODIUM-DEXTROSE 2-4 GM/100ML-% IV SOLN
2.0000 g | Freq: Three times a day (TID) | INTRAVENOUS | Status: AC
  Administered 2022-11-19 – 2022-11-21 (×6): 2 g via INTRAVENOUS
  Filled 2022-11-19 (×6): qty 100

## 2022-11-19 MED ORDER — ASPIRIN 81 MG PO CHEW
324.0000 mg | CHEWABLE_TABLET | Freq: Once | ORAL | Status: AC
  Administered 2022-11-19: 324 mg via ORAL
  Filled 2022-11-19: qty 4

## 2022-11-19 MED ORDER — PROPOFOL 10 MG/ML IV BOLUS
INTRAVENOUS | Status: DC | PRN
  Administered 2022-11-19: 70 mg via INTRAVENOUS
  Administered 2022-11-19: 50 mg via INTRAVENOUS

## 2022-11-19 MED ORDER — ROCURONIUM BROMIDE 10 MG/ML (PF) SYRINGE
PREFILLED_SYRINGE | INTRAVENOUS | Status: DC | PRN
  Administered 2022-11-19: 30 mg via INTRAVENOUS
  Administered 2022-11-19: 50 mg via INTRAVENOUS
  Administered 2022-11-19: 100 mg via INTRAVENOUS
  Administered 2022-11-19: 30 mg via INTRAVENOUS
  Administered 2022-11-19: 50 mg via INTRAVENOUS

## 2022-11-19 MED ORDER — ASPIRIN 325 MG PO TBEC
325.0000 mg | DELAYED_RELEASE_TABLET | Freq: Every day | ORAL | Status: DC
  Administered 2022-11-20 – 2022-11-21 (×2): 325 mg via ORAL
  Filled 2022-11-19 (×2): qty 1

## 2022-11-19 MED ORDER — PHENYLEPHRINE HCL-NACL 20-0.9 MG/250ML-% IV SOLN
30.0000 ug/min | INTRAVENOUS | Status: DC
  Administered 2022-11-19: 30 ug/min via INTRAVENOUS
  Filled 2022-11-19: qty 250

## 2022-11-19 MED ORDER — EPHEDRINE 5 MG/ML INJ
INTRAVENOUS | Status: AC
  Filled 2022-11-19: qty 5

## 2022-11-19 MED ORDER — CHLORHEXIDINE GLUCONATE CLOTH 2 % EX PADS
6.0000 | MEDICATED_PAD | Freq: Every day | CUTANEOUS | Status: DC
  Administered 2022-11-19 – 2022-11-25 (×7): 6 via TOPICAL

## 2022-11-19 MED ORDER — DOCUSATE SODIUM 100 MG PO CAPS
200.0000 mg | ORAL_CAPSULE | Freq: Every day | ORAL | Status: DC
  Administered 2022-11-20 – 2022-11-25 (×6): 200 mg via ORAL
  Filled 2022-11-19 (×6): qty 2

## 2022-11-19 MED ORDER — LACTATED RINGERS IV SOLN: INTRAVENOUS | Status: DC | PRN

## 2022-11-19 MED ORDER — HEPARIN SODIUM (PORCINE) 1000 UNIT/ML IJ SOLN
INTRAMUSCULAR | Status: DC | PRN
  Administered 2022-11-19: 47000 [IU] via INTRAVENOUS

## 2022-11-19 MED ORDER — HEPARIN SODIUM (PORCINE) 1000 UNIT/ML IJ SOLN
INTRAMUSCULAR | Status: AC
  Filled 2022-11-19: qty 20

## 2022-11-19 MED ORDER — VASOPRESSIN 20 UNIT/ML IV SOLN
INTRAVENOUS | Status: DC | PRN
Start: 2022-11-19 — End: 2022-11-19
  Administered 2022-11-19: 1 [IU] via INTRAVENOUS

## 2022-11-19 MED ORDER — METOPROLOL TARTRATE 5 MG/5ML IV SOLN: 2.5000 mg | INTRAVENOUS | Status: DC | PRN

## 2022-11-19 MED ORDER — VASOPRESSIN 20 UNIT/ML IV SOLN
INTRAVENOUS | Status: AC
  Filled 2022-11-19: qty 1

## 2022-11-19 MED ORDER — NICARDIPINE HCL IN NACL 20-0.86 MG/200ML-% IV SOLN
0.0000 mg/h | INTRAVENOUS | Status: DC
  Administered 2022-11-20: 2 mg/h via INTRAVENOUS
  Filled 2022-11-19: qty 200

## 2022-11-19 MED ORDER — SODIUM CHLORIDE 0.9 % IV SOLN: INTRAVENOUS | Status: DC | PRN

## 2022-11-19 MED ORDER — PROTAMINE SULFATE 10 MG/ML IV SOLN
INTRAVENOUS | Status: AC
  Filled 2022-11-19: qty 50

## 2022-11-19 MED ORDER — HEPARIN SODIUM (PORCINE) 1000 UNIT/ML IJ SOLN
INTRAMUSCULAR | Status: AC
  Filled 2022-11-19: qty 1

## 2022-11-19 MED ORDER — SODIUM CHLORIDE 0.45 % IV SOLN: INTRAVENOUS | Status: DC | PRN

## 2022-11-19 MED ORDER — MIDAZOLAM HCL (PF) 5 MG/ML IJ SOLN
INTRAMUSCULAR | Status: DC | PRN
  Administered 2022-11-19: 2 mg via INTRAVENOUS
  Administered 2022-11-19 (×2): 1 mg via INTRAVENOUS
  Administered 2022-11-19: 2 mg via INTRAVENOUS
  Administered 2022-11-19: 1 mg via INTRAVENOUS
  Administered 2022-11-19: 2 mg via INTRAVENOUS

## 2022-11-19 MED ORDER — ROCURONIUM BROMIDE 10 MG/ML (PF) SYRINGE
PREFILLED_SYRINGE | INTRAVENOUS | Status: AC
  Filled 2022-11-19: qty 10

## 2022-11-19 MED ORDER — CHLORHEXIDINE GLUCONATE 0.12 % MT SOLN
15.0000 mL | OROMUCOSAL | Status: AC
  Administered 2022-11-19: 15 mL via OROMUCOSAL
  Filled 2022-11-19: qty 15

## 2022-11-19 MED ORDER — FENTANYL CITRATE (PF) 250 MCG/5ML IJ SOLN
INTRAMUSCULAR | Status: DC | PRN
  Administered 2022-11-19 (×2): 100 ug via INTRAVENOUS
  Administered 2022-11-19: 150 ug via INTRAVENOUS
  Administered 2022-11-19 (×5): 100 ug via INTRAVENOUS
  Administered 2022-11-19: 50 ug via INTRAVENOUS

## 2022-11-19 MED ORDER — DEXTROSE 50 % IV SOLN: 0.0000 mL | INTRAVENOUS | Status: DC | PRN

## 2022-11-19 MED ORDER — OXYCODONE HCL 5 MG PO TABS
5.0000 mg | ORAL_TABLET | ORAL | Status: DC | PRN
  Administered 2022-11-19 – 2022-11-20 (×3): 10 mg via ORAL
  Administered 2022-11-20: 5 mg via ORAL
  Administered 2022-11-20 – 2022-11-25 (×11): 10 mg via ORAL
  Filled 2022-11-19 (×6): qty 2
  Filled 2022-11-19: qty 1
  Filled 2022-11-19 (×9): qty 2

## 2022-11-19 MED ORDER — SODIUM CHLORIDE 0.9% FLUSH: 3.0000 mL | INTRAVENOUS | Status: DC | PRN

## 2022-11-19 MED ORDER — ASPIRIN 81 MG PO CHEW: 324.0000 mg | CHEWABLE_TABLET | Freq: Every day | ORAL | Status: DC

## 2022-11-19 MED ORDER — ACETAMINOPHEN 160 MG/5ML PO SOLN
650.0000 mg | Freq: Once | ORAL | Status: AC
  Administered 2022-11-19: 650 mg
  Filled 2022-11-19: qty 20.3

## 2022-11-19 MED ORDER — PROPOFOL 10 MG/ML IV BOLUS
INTRAVENOUS | Status: AC
  Filled 2022-11-19: qty 20

## 2022-11-19 MED ORDER — METOPROLOL TARTRATE 12.5 MG HALF TABLET
12.5000 mg | ORAL_TABLET | Freq: Two times a day (BID) | ORAL | Status: DC
  Administered 2022-11-20 – 2022-11-21 (×4): 12.5 mg via ORAL
  Filled 2022-11-19 (×4): qty 1

## 2022-11-19 MED ORDER — TRANEXAMIC ACID-NACL 1000-0.7 MG/100ML-% IV SOLN
INTRAVENOUS | Status: AC
  Filled 2022-11-19: qty 100

## 2022-11-19 MED ORDER — MORPHINE SULFATE (PF) 2 MG/ML IV SOLN
1.0000 mg | INTRAVENOUS | Status: DC | PRN
  Administered 2022-11-19: 4 mg via INTRAVENOUS
  Administered 2022-11-19 (×4): 2 mg via INTRAVENOUS
  Administered 2022-11-20: 4 mg via INTRAVENOUS
  Administered 2022-11-20: 2 mg via INTRAVENOUS
  Administered 2022-11-20: 4 mg via INTRAVENOUS
  Filled 2022-11-19: qty 1
  Filled 2022-11-19 (×3): qty 2
  Filled 2022-11-19 (×4): qty 1

## 2022-11-19 MED ORDER — ORAL CARE MOUTH RINSE
15.0000 mL | OROMUCOSAL | Status: DC
  Administered 2022-11-19 (×2): 15 mL via OROMUCOSAL

## 2022-11-19 MED ORDER — SODIUM CHLORIDE (PF) 0.9 % IJ SOLN
INTRAMUSCULAR | Status: AC
  Filled 2022-11-19: qty 20

## 2022-11-19 MED ORDER — INSULIN REGULAR(HUMAN) IN NACL 100-0.9 UT/100ML-% IV SOLN
INTRAVENOUS | Status: DC
  Administered 2022-11-19: 0.7 [IU]/h via INTRAVENOUS

## 2022-11-19 MED ORDER — PLASMA-LYTE A IV SOLN: INTRAVENOUS | Status: DC | PRN

## 2022-11-19 MED ORDER — ALBUMIN HUMAN 5 % IV SOLN: INTRAVENOUS | Status: DC | PRN

## 2022-11-19 MED ORDER — MIDAZOLAM HCL (PF) 10 MG/2ML IJ SOLN
INTRAMUSCULAR | Status: AC
  Filled 2022-11-19: qty 2

## 2022-11-19 MED ORDER — SODIUM BICARBONATE 8.4 % IV SOLN
100.0000 meq | Freq: Once | INTRAVENOUS | Status: AC
  Administered 2022-11-19: 100 meq via INTRAVENOUS

## 2022-11-19 MED ORDER — SODIUM CHLORIDE 0.9% FLUSH
3.0000 mL | Freq: Two times a day (BID) | INTRAVENOUS | Status: DC
  Administered 2022-11-20 – 2022-11-23 (×6): 3 mL via INTRAVENOUS

## 2022-11-19 MED ORDER — VANCOMYCIN HCL IN DEXTROSE 1-5 GM/200ML-% IV SOLN
1000.0000 mg | Freq: Once | INTRAVENOUS | Status: AC
  Administered 2022-11-19: 1000 mg via INTRAVENOUS
  Filled 2022-11-19: qty 200

## 2022-11-19 MED ORDER — PANTOPRAZOLE SODIUM 40 MG IV SOLR
40.0000 mg | Freq: Every day | INTRAVENOUS | Status: AC
  Administered 2022-11-19 – 2022-11-20 (×2): 40 mg via INTRAVENOUS
  Filled 2022-11-19 (×2): qty 10

## 2022-11-19 MED ORDER — ACETAMINOPHEN 160 MG/5ML PO SOLN: 1000.0000 mg | Freq: Four times a day (QID) | ORAL | Status: AC

## 2022-11-19 MED ORDER — 0.9 % SODIUM CHLORIDE (POUR BTL) OPTIME
TOPICAL | Status: DC | PRN
Start: 2022-11-19 — End: 2022-11-19
  Administered 2022-11-19: 5000 mL

## 2022-11-19 MED ORDER — METOPROLOL TARTRATE 12.5 MG HALF TABLET
12.5000 mg | ORAL_TABLET | Freq: Once | ORAL | Status: AC
  Administered 2022-11-19: 12.5 mg via ORAL
  Filled 2022-11-19: qty 1

## 2022-11-19 MED ORDER — DEXMEDETOMIDINE HCL IN NACL 400 MCG/100ML IV SOLN: 0.0000 ug/kg/h | INTRAVENOUS | Status: DC

## 2022-11-19 MED ORDER — EPHEDRINE SULFATE-NACL 50-0.9 MG/10ML-% IV SOSY
PREFILLED_SYRINGE | INTRAVENOUS | Status: DC | PRN
  Administered 2022-11-19 (×2): 2.5 mg via INTRAVENOUS
  Administered 2022-11-19: 5 mg via INTRAVENOUS

## 2022-11-19 MED ORDER — SODIUM CHLORIDE 0.9 % IV SOLN: INTRAVENOUS | Status: DC

## 2022-11-19 MED ORDER — METOPROLOL TARTRATE 25 MG/10 ML ORAL SUSPENSION
12.5000 mg | Freq: Two times a day (BID) | ORAL | Status: DC
  Filled 2022-11-19 (×2): qty 5

## 2022-11-19 MED ORDER — CHLORHEXIDINE GLUCONATE 0.12 % MT SOLN: 15.0000 mL | Freq: Once | OROMUCOSAL | Status: AC

## 2022-11-19 MED ORDER — METOCLOPRAMIDE HCL 5 MG/ML IJ SOLN
10.0000 mg | Freq: Four times a day (QID) | INTRAMUSCULAR | Status: AC
  Administered 2022-11-19 – 2022-11-20 (×6): 10 mg via INTRAVENOUS
  Filled 2022-11-19 (×6): qty 2

## 2022-11-19 MED ORDER — PANTOPRAZOLE SODIUM 40 MG PO TBEC
40.0000 mg | DELAYED_RELEASE_TABLET | Freq: Every day | ORAL | Status: DC
  Administered 2022-11-21 – 2022-11-25 (×5): 40 mg via ORAL
  Filled 2022-11-19 (×5): qty 1

## 2022-11-19 MED ORDER — SODIUM CHLORIDE (PF) 0.9 % IJ SOLN: OROMUCOSAL | Status: DC | PRN

## 2022-11-19 MED ORDER — NICARDIPINE HCL IN NACL 20-0.86 MG/200ML-% IV SOLN
INTRAVENOUS | Status: DC | PRN
Start: 2022-11-19 — End: 2022-11-19
  Administered 2022-11-19: 2.5 mg/h via INTRAVENOUS

## 2022-11-19 MED ORDER — PROTAMINE SULFATE 10 MG/ML IV SOLN
INTRAVENOUS | Status: DC | PRN
  Administered 2022-11-19: 470 mg via INTRAVENOUS

## 2022-11-19 MED ORDER — SODIUM CHLORIDE 0.9 % IV SOLN: 250.0000 mL | INTRAVENOUS | Status: DC

## 2022-11-19 MED ORDER — MIDAZOLAM HCL 2 MG/2ML IJ SOLN: 2.0000 mg | INTRAMUSCULAR | Status: DC | PRN

## 2022-11-19 MED ORDER — ACETAMINOPHEN 500 MG PO TABS
1000.0000 mg | ORAL_TABLET | Freq: Four times a day (QID) | ORAL | Status: AC
  Administered 2022-11-19 – 2022-11-24 (×17): 1000 mg via ORAL
  Filled 2022-11-19 (×17): qty 2

## 2022-11-19 MED ORDER — LIDOCAINE 2% (20 MG/ML) 5 ML SYRINGE
INTRAMUSCULAR | Status: AC
  Filled 2022-11-19: qty 5

## 2022-11-19 SURGICAL SUPPLY — 97 items
ADH SKN CLS APL DERMABOND .7 (GAUZE/BANDAGES/DRESSINGS) ×3
ANTIFOG SOL W/FOAM PAD STRL (MISCELLANEOUS) ×3
APPLIER CLIP 9.375 SM OPEN (CLIP) ×3
APR CLP SM 9.3 20 MLT OPN (CLIP) ×3
BAG DECANTER FOR FLEXI CONT (MISCELLANEOUS) ×3 IMPLANT
BLADE CLIPPER SURG (BLADE) ×6 IMPLANT
BLADE STERNUM SYSTEM 6 (BLADE) ×3 IMPLANT
BLADE SURG 11 STRL SS (BLADE) IMPLANT
BLADE SURG 15 STRL LF DISP TIS (BLADE) ×3 IMPLANT
BLADE SURG 15 STRL SS (BLADE) ×3
BNDG CMPR 5X4 KNIT ELC UNQ LF (GAUZE/BANDAGES/DRESSINGS) ×6
BNDG CMPR 6 X 5 YARDS HK CLSR (GAUZE/BANDAGES/DRESSINGS) ×3
BNDG ELASTIC 4INX 5YD STR LF (GAUZE/BANDAGES/DRESSINGS) IMPLANT
BNDG ELASTIC 6INX 5YD STR LF (GAUZE/BANDAGES/DRESSINGS) IMPLANT
BNDG GAUZE DERMACEA FLUFF 4 (GAUZE/BANDAGES/DRESSINGS) ×3 IMPLANT
BNDG GZE DERMACEA 4 6PLY (GAUZE/BANDAGES/DRESSINGS) ×6
CABLE SURGICAL S-101-97-12 (CABLE) ×3 IMPLANT
CANISTER SUCT 3000ML PPV (MISCELLANEOUS) ×3 IMPLANT
CANNULA MC2 2 STG 29/37 NON-V (CANNULA) ×3 IMPLANT
CANNULA NON VENT 20FR 12 (CANNULA) ×3 IMPLANT
CANNULA NON VENT 22FR 12 (CANNULA) IMPLANT
CANNULA VESSEL 3MM BLUNT TIP (CANNULA) IMPLANT
CATH ROBINSON RED A/P 18FR (CATHETERS) ×6 IMPLANT
CLIP APPLIE 9.375 SM OPEN (CLIP) ×3 IMPLANT
CONN ST 1/2X1/2 BEN (MISCELLANEOUS) ×3 IMPLANT
CONNECTOR BLAKE 2:1 CARIO BLK (MISCELLANEOUS) ×3 IMPLANT
COVER CAMERA OR STL DISP (MISCELLANEOUS) IMPLANT
COVER MAYO STAND STRL (DRAPES) ×3 IMPLANT
DERMABOND ADVANCED .7 DNX12 (GAUZE/BANDAGES/DRESSINGS) IMPLANT
DRAIN CHANNEL 19F RND (DRAIN) ×9 IMPLANT
DRAIN CONNECTOR BLAKE 1:1 (MISCELLANEOUS) ×3 IMPLANT
DRAPE CARDIOVASCULAR INCISE (DRAPES) ×3
DRAPE EXTREMITY T 121X128X90 (DISPOSABLE) ×3 IMPLANT
DRAPE HALF SHEET 40X57 (DRAPES) ×3 IMPLANT
DRAPE INCISE IOBAN 66X45 STRL (DRAPES) IMPLANT
DRAPE SRG 135X102X78XABS (DRAPES) ×3 IMPLANT
DRAPE WARM FLUID 44X44 (DRAPES) ×3 IMPLANT
DRSG AQUACEL AG ADV 3.5X10 (GAUZE/BANDAGES/DRESSINGS) ×3 IMPLANT
DRSG AQUACEL AG ADV 3.5X14 (GAUZE/BANDAGES/DRESSINGS) ×3 IMPLANT
ELECT BLADE 4.0 EZ CLEAN MEGAD (MISCELLANEOUS) ×3
ELECT REM PT RETURN 9FT ADLT (ELECTROSURGICAL) ×6
ELECTRODE BLDE 4.0 EZ CLN MEGD (MISCELLANEOUS) ×3 IMPLANT
ELECTRODE REM PT RTRN 9FT ADLT (ELECTROSURGICAL) ×6 IMPLANT
FELT TEFLON 1X6 (MISCELLANEOUS) ×6 IMPLANT
GAUZE 4X4 16PLY ~~LOC~~+RFID DBL (SPONGE) ×3 IMPLANT
GAUZE SPONGE 4X4 12PLY STRL (GAUZE/BANDAGES/DRESSINGS) ×6 IMPLANT
GLOVE BIO SURGEON STRL SZ 6.5 (GLOVE) IMPLANT
GLOVE BIO SURGEON STRL SZ7 (GLOVE) ×6 IMPLANT
GLOVE BIOGEL M STRL SZ7.5 (GLOVE) ×6 IMPLANT
GOWN STRL REUS W/ TWL LRG LVL3 (GOWN DISPOSABLE) ×12 IMPLANT
GOWN STRL REUS W/ TWL XL LVL3 (GOWN DISPOSABLE) ×6 IMPLANT
GOWN STRL REUS W/TWL LRG LVL3 (GOWN DISPOSABLE) ×12
GOWN STRL REUS W/TWL XL LVL3 (GOWN DISPOSABLE) ×6
HEMOSTAT POWDER SURGIFOAM 1G (HEMOSTASIS) ×6 IMPLANT
INSERT SUTURE HOLDER (MISCELLANEOUS) ×3 IMPLANT
KIT BASIN OR (CUSTOM PROCEDURE TRAY) ×3 IMPLANT
KIT SUCTION CATH 14FR (SUCTIONS) ×3 IMPLANT
KIT TURNOVER KIT B (KITS) ×6 IMPLANT
KIT VASOVIEW HEMOPRO 2 VH 4000 (KITS) ×3 IMPLANT
LEAD PACING MYOCARDI (MISCELLANEOUS) ×3 IMPLANT
MARKER GRAFT CORONARY BYPASS (MISCELLANEOUS) ×9 IMPLANT
NS IRRIG 1000ML POUR BTL (IV SOLUTION) ×15 IMPLANT
PACK E OPEN HEART (SUTURE) ×3 IMPLANT
PACK OPEN HEART (CUSTOM PROCEDURE TRAY) ×3 IMPLANT
PAD ARMBOARD 7.5X6 YLW CONV (MISCELLANEOUS) ×12 IMPLANT
PAD ELECT DEFIB RADIOL ZOLL (MISCELLANEOUS) ×3 IMPLANT
PENCIL BUTTON HOLSTER BLD 10FT (ELECTRODE) ×3 IMPLANT
POSITIONER HEAD DONUT 9IN (MISCELLANEOUS) ×3 IMPLANT
PUNCH AORTIC ROTATE 4.0MM (MISCELLANEOUS) ×3 IMPLANT
SET MPS 3-ND DEL (MISCELLANEOUS) IMPLANT
SHEARS HARMONIC 9CM CVD (BLADE) ×3 IMPLANT
SOLUTION ANTFG W/FOAM PAD STRL (MISCELLANEOUS) IMPLANT
SUPPORT HEART JANKE-BARRON (MISCELLANEOUS) ×3 IMPLANT
SUT BONE WAX W31G (SUTURE) ×3 IMPLANT
SUT ETHIBOND X763 2 0 SH 1 (SUTURE) ×6 IMPLANT
SUT MNCRL AB 3-0 PS2 18 (SUTURE) ×6 IMPLANT
SUT MNCRL AB 4-0 PS2 18 (SUTURE) IMPLANT
SUT PDS AB 1 CTX 36 (SUTURE) ×6 IMPLANT
SUT PROLENE 3 0 SH DA (SUTURE) IMPLANT
SUT PROLENE 4 0 SH DA (SUTURE) ×3 IMPLANT
SUT PROLENE 5 0 C 1 36 (SUTURE) ×9 IMPLANT
SUT PROLENE 7 0 BV1 MDA (SUTURE) ×3 IMPLANT
SUT VIC AB 2-0 CT1 27 (SUTURE) ×6
SUT VIC AB 2-0 CT1 TAPERPNT 27 (SUTURE) IMPLANT
SUT VIC AB 3-0 SH 27 (SUTURE)
SUT VIC AB 3-0 SH 27X BRD (SUTURE) IMPLANT
SUT VIC AB 3-0 X1 27 (SUTURE) IMPLANT
SYR 50ML SLIP (SYRINGE) IMPLANT
SYSTEM SAHARA CHEST DRAIN ATS (WOUND CARE) ×3 IMPLANT
TAPE CLOTH SURG 4X10 WHT LF (GAUZE/BANDAGES/DRESSINGS) IMPLANT
TAPE PAPER 2X10 WHT MICROPORE (GAUZE/BANDAGES/DRESSINGS) IMPLANT
TOWEL GREEN STERILE (TOWEL DISPOSABLE) ×6 IMPLANT
TOWEL GREEN STERILE FF (TOWEL DISPOSABLE) ×6 IMPLANT
TRAY FOLEY SLVR 16FR TEMP STAT (SET/KITS/TRAYS/PACK) ×3 IMPLANT
TUBING LAP HI FLOW INSUFFLATIO (TUBING) ×3 IMPLANT
UNDERPAD 30X36 HEAVY ABSORB (UNDERPADS AND DIAPERS) ×6 IMPLANT
WATER STERILE IRR 1000ML POUR (IV SOLUTION) ×6 IMPLANT

## 2022-11-19 NOTE — Plan of Care (Signed)
Problem: Activity: Goal: Ability to tolerate increased activity will improve Outcome: Progressing   Problem: Cardiac: Goal: Ability to achieve and maintain adequate cardiovascular perfusion will improve Outcome: Progressing

## 2022-11-19 NOTE — Anesthesia Procedure Notes (Signed)
Procedure Name: Intubation Date/Time: 11/19/2022 8:03 AM  Performed by: Waynard Edwards, CRNAPre-anesthesia Checklist: Patient identified, Emergency Drugs available, Suction available and Patient being monitored Patient Re-evaluated:Patient Re-evaluated prior to induction Oxygen Delivery Method: Circle system utilized Preoxygenation: Pre-oxygenation with 100% oxygen Induction Type: IV induction Ventilation: Two handed mask ventilation required Laryngoscope Size: Glidescope and 4 Grade View: Grade I Tube type: Oral Tube size: 8.0 mm Number of attempts: 1 Airway Equipment and Method: Stylet and Oral airway Placement Confirmation: ETT inserted through vocal cords under direct vision, positive ETCO2 and breath sounds checked- equal and bilateral Secured at: 24 cm Tube secured with: Tape Dental Injury: Teeth and Oropharynx as per pre-operative assessment

## 2022-11-19 NOTE — Transfer of Care (Signed)
Immediate Anesthesia Transfer of Care Note  Patient: Alexander Simon  Procedure(s) Performed: CORONARY ARTERY BYPASS GRAFTING (CABG) X THREE  USING LEFT INTERNAL MAMMARY ARTERY, LEFT RADIAL ARTERY, AND ENDOSCOPICALLY HARVESTED RIGHT GREATER SAPHENOUS VEIN (Chest) TRANSESOPHAGEAL ECHOCARDIOGRAM LEFT RADIAL ARTERY HARVEST (Left: Arm Lower)  Patient Location: ICU  Anesthesia Type:General  Level of Consciousness: sedated and Patient remains intubated per anesthesia plan  Airway & Oxygen Therapy: Patient remains intubated per anesthesia plan and Patient placed on Ventilator (see vital sign flow sheet for setting)  Post-op Assessment: Report given to RN and Post -op Vital signs reviewed and stable  Post vital signs: Reviewed and stable  Last Vitals:  Vitals Value Taken Time  BP 95/53  66 11/19/22  1327  Temp    Pulse 82 11/19/22  1327  Resp 14 11/19/22  1327  SpO2 98 11/19/22  1327    Last Pain:  Vitals:   11/19/22 0550  TempSrc: Oral  PainSc:          Complications: No notable events documented.

## 2022-11-19 NOTE — Progress Notes (Signed)
      [address]       [place] [address]             [phone]      S/p CABG x 3  Extubated  BP 127/60   Pulse 88   Temp 99.9 F (37.7 C)   Resp 20   Wt 134 kg   SpO2 99%   BMI 40.07 kg/m  CVP 12, CI 3.4 by Flotrack   Intake/Output Summary (Last 24 hours) at 11/19/2022 1808 Last data filed at 11/19/2022 1700 Gross per 24 hour  Intake 4494.18 ml  Output 2880 ml  Net 1614.18 ml  Hct 29, K 4.3  Doing well early postop  Viviann Spare C. Dorris Fetch, MD [facility] [phone]

## 2022-11-19 NOTE — Anesthesia Procedure Notes (Addendum)
Central Venous Catheter Insertion Performed by: [name], MD, anesthesiologist Start/End9/23/2024 7:05 AM, 11/19/2022 7:17 AM Preanesthetic checklist: patient identified, IV checked, risks and benefits discussed, surgical consent, monitors and equipment checked, pre-op evaluation, timeout performed and anesthesia consent Position: Trendelenburg Lidocaine 1% used for infiltration and patient sedated Hand hygiene performed , maximum sterile barriers used  and Seldinger technique used Catheter size: 8.5 Fr Central line was placed.Sheath introducer Procedure performed using ultrasound guided technique. Ultrasound Notes:anatomy identified, needle tip was noted to be adjacent to the nerve/plexus identified, no ultrasound evidence of intravascular and/or intraneural injection and image(s) printed for medical record Attempts: 1 Following insertion, line sutured, dressing applied and Biopatch. Post procedure assessment: blood return through all ports, free fluid flow and no air  Patient tolerated the procedure well with no immediate complications. Additional procedure comments: Triple lumen SLIC placed via Swan port.

## 2022-11-19 NOTE — Progress Notes (Signed)
Pt transported via vent with no complications noted. RT at bedside upon arrival.

## 2022-11-19 NOTE — Anesthesia Postprocedure Evaluation (Signed)
Anesthesia Post Note  Patient: Alexander Simon  Procedure(s) Performed: CORONARY ARTERY BYPASS GRAFTING (CABG) X THREE  USING LEFT INTERNAL MAMMARY ARTERY, LEFT RADIAL ARTERY, AND ENDOSCOPICALLY HARVESTED RIGHT GREATER SAPHENOUS VEIN (Chest) TRANSESOPHAGEAL ECHOCARDIOGRAM LEFT RADIAL ARTERY HARVEST (Left: Arm Lower)     Patient location during evaluation: ICU Anesthesia Type: General Level of consciousness: sedated and patient remains intubated per anesthesia plan Pain management: pain level controlled Vital Signs Assessment: post-procedure vital signs reviewed and stable Respiratory status: patient remains intubated per anesthesia plan Cardiovascular status: stable Postop Assessment: no apparent nausea or vomiting Anesthetic complications: no   No notable events documented.  Last Vitals:  Vitals:   11/19/22 1515 11/19/22 1530  BP:    Pulse: 80 81  Resp: 20 20  Temp: 36.9 C 36.9 C  SpO2: 99% 100%    Last Pain:  Vitals:   11/19/22 0550  TempSrc: Oral  PainSc:                  Alexander Simon

## 2022-11-19 NOTE — Hospital Course (Addendum)
History of Present Illness:  Alexander Simon is a 52 yo obese AA male with history of GERD and HTN. The patient presented to the ED on 9/16 with complaints of substernal chest pain, left arm numbness and shortness of breath. The patient's symptoms developed while he was out to dinner. He drove home after but continued to feel bad prompting him to seek evaluation at an urgent care facility. Troponin level obtained was elevated and they felt patient needed a higher level of care and sent him to the ED. He was chest pain free on arrival to the ED. EKG obtained showed NSR with some T wave inversions and subtle ST depression. He was ruled in for NSTEMI, admitted and placed on Heparin drip. Cardiology consult was obtained and they recommended the patient undergo cardiac catheterization. He was agreeable to proceed. This was performed on 11/13/2022 and showed multivessel CAD. It was felt the ostial left circumflex would be difficult to treat with PCI. Due to this it was recommended the patient be transferred to [facility] for possible coronary bypass grafting.   Hospital Course:  The patient remained chest pain free prior to surgery.  He was evaluated by Dr. Cliffton Asters who felt the patient would benefit from coronary bypass grafting.  The risks and benefits of the procedure were explained to the patient and he was agreeable to proceed.  The patient was taken to the operating room and underwent Coronary artery bypass grafting x 3 utilizing LIMA to LAD, Radial artery to OM, and SVG to Diagonal on 09/23.  He also underwent open left radial artery harvest and endoscopic harvest of greater saphenous vein from his right thigh.  He tolerated the procedure without difficulty and was taken to the SICU in stable condition.  He was placed on Cardene drip for his radial artery graft.  He was extubated the evening of surgery.  He was transitioned off Cardene drip and started on Norvasc for his radial artery graft.  He was  volume overloaded and started on diuretics. His pacing wires and chest tubes were removed without complication. He was felt stable for transfer to the progressive unit. He was tachycardic and lopressor was titrated. Plavix was started for his history of NSTEMI. Tachycardia and hypertension persisted with the titration of Lopressor. Cardiology transitioned him to Carvedilol 12.5mg  BID, stopped his Norvasc and transitioned him to [facility] 24-26mg  BID. Norvasc was restarted for radial artery grafts. Aldactone and Jardiance was started for GDMT. He had an elevated potassium, Aldactone was discontinued. He was tolerating full GDMT without complications. He was ambulating well on room air. His incisions were healing well without sign of infection. He was felt stable for discharge home.

## 2022-11-19 NOTE — Brief Op Note (Signed)
11/14/2022 - 11/19/2022  12:56 PM  PATIENT:  Alexander Simon  52 y.o. male  PRE-OPERATIVE DIAGNOSIS:  CORONARY ARTERY DISEASE  POST-OPERATIVE DIAGNOSIS:  CORONARY ARTERY DISEASE  PROCEDURES:   CORONARY ARTERY BYPASS GRAFTING (CABG) X THREE  USING LEFT INTERNAL MAMMARY ARTERY, LEFT RADIAL ARTERY, AND ENDOSCOPICALLY HARVESTED RIGHT GREATER SAPHENOUS VEIN   LEFT RADIAL ARTERY HARVEST  Vein harvest time: Vein prep time:  TRANSESOPHAGEAL ECHOCARDIOGRAM   SURGEON:  Corliss Skains, MD   PHYSICIAN ASSISTANTS: E. Barrett and M. Evagelia Knack  ASSISTANTS: Maricela Curet, RN, Scrub Person         Ria Bush, RN, Scrub Person   ANESTHESIA:   general  EBL:  1400 mL ( CellS[employer] blood returned to patient)  BLOOD ADMINISTERED: none  DRAINS:  left pleural and mediastinal drains    LOCAL MEDICATIONS USED:  NONE  COUNTS:  Correct  DICTATION: .Dragon Dictation  PLAN OF CARE: Admit to inpatient   PATIENT DISPOSITION:  ICU - intubated and hemodynamically stable.   Delay start of Pharmacological VTE agent (>24hrs) due to surgical blood loss or risk of bleeding: yes

## 2022-11-19 NOTE — Consult Note (Signed)
NAME:  Alexander Simon, MRN:  [id number], DOB:  04/17/1970, LOS: 5 ADMISSION DATE:  11/14/2022, CONSULTATION DATE:  11/19/22 REFERRING MD:  Cliffton Asters CHIEF COMPLAINT: Chest pain  History of Present Illness:  This is a 52 year old man who is presenting with chest pain.  Subsequent workup revealed multifocal coronary artery disease.  He underwent CABG x 3 LIMA LAD, RSVG diagonal, left radial OM Y graft off RSVG, right saphenous vein harvest, left radial artery harvest.  He arrives to the unit intubated and sedated and pulmonary critical care is consulted for subsequent management.  Operative course complicated by hypoxemia and difficult anatomy related to small aorta and overriding pulmonary artery limiting space for grafting.  Total pump time 93 minutes, cross-clamp time 71 minutes.  Pertinent  Medical History  Gastric reflux Morbid obesity Hypertension  Significant Hospital Events: Including procedures, antibiotic start and stop dates in addition to other pertinent events   9/17 cath Multivessel CAD, including 50% ostial LMCA stenosis, mild luminal irregularities involving the LAD, 90% ostial and mid/distal LCx lesions, and sequential 50-60% RCA stenoses.  LVEDP 18 mmHg.  Interim History / Subjective:  Consult  Objective   Blood pressure (!) 106/59, pulse 81, temperature 98.1 F (36.7 C), resp. rate 20, weight 134 kg, SpO2 98%. CVP:  [9 mmHg-11 mmHg] 11 mmHg CO:  [6.7 L/min] 6.7 L/min CI:  [2.8 L/min/m2] 2.8 L/min/m2  Vent Mode: SIMV;PRVC;PSV FiO2 (%):  [60 %-100 %] 60 % Set Rate:  [16 bmp-20 bmp] 20 bmp Vt Set:  [620 mL] 620 mL PEEP:  [5 cmH20-10 cmH20] 5 cmH20 Pressure Support:  [10 cmH20] 10 cmH20 Plateau Pressure:  [25 cmH20] 25 cmH20   Intake/Output Summary (Last 24 hours) at 11/19/2022 1433 Last data filed at 11/19/2022 1400 Gross per 24 hour  Intake 3851.81 ml  Output 2650 ml  Net 1201.81 ml   Filed Weights   11/16/22 0316 11/17/22 0532 11/18/22 0353  Weight: 132.5 kg  132.2 kg 134 kg    Examination: General: intubated, double triggering vent HENT: ETT in place, some mild tongue swelling Lungs: Rhonci bilaterally, autotriggering Cardiovascular: RRR, ext warm Abdomen: Soft, hypoactive BS Extremities: LUE wrapped with +cap refill, RLE wrapped Neuro: sedated/paralyzed GU: foley in place Skin: drains in place minimal bloody output, sternotomy site CDI  CXR low lung volumes  Resolved Hospital Problem list   Not applicable  Assessment & Plan:  Postoperative ventilator management Status post three-vessel CABG 11/19/2022 Status post right greater saphenous vein harvest 11/19/2022 Status post left radial harvest 11/19/2022 Morbid obesity Hypertension Reflux  -Rapid wean pathway, may be complicated by de recruitment in the OR based on chest x-ray -Wean sedation as able -Monitor drain output -Calcium channel blocker per usual radial harvest protocol -We will follow with you while in ICU  Best Practice (right click and "Reselect all SmartList Selections" daily)   Diet/type: NPO DVT prophylaxis: not indicated GI prophylaxis: PPI Lines: Central line Foley:  Yes, and it is still needed Code Status:  full code Last date of multidisciplinary goals of care discussion [per primary]  Labs   CBC: Recent Labs  Lab 11/12/22 1809 11/13/22 0503 11/15/22 0313 11/16/22 0428 11/17/22 0333 11/18/22 0740 11/19/22 0018 11/19/22 0813 11/19/22 1047 11/19/22 1124 11/19/22 1128 11/19/22 1221 11/19/22 1236  WBC 7.2   < > 7.8 7.1 6.4 6.7 7.2  --   --   --   --   --   --   NEUTROABS 3.8  --   --   --   --   --   --   --   --   --   --   --   --  HGB 12.9*   < > 12.3* 13.1 12.7* 12.8* 12.0*   < > 8.8* 9.1* 8.8* 9.9* 8.8*  HCT 40.9   < > 40.3 41.1 40.6 39.7 39.5   < > 26.0* 28.4* 26.0* 29.0* 26.0*  MCV 82.0   < > 82.2 82.5 80.4 80.5 82.3  --   --   --   --   --   --   PLT 220   < > 229 209 217 212 224  --   --  164  --   --   --    < > = values in this  interval not displayed.    Basic Metabolic Panel: Recent Labs  Lab 11/14/22 2109 11/16/22 0428 11/17/22 0333 11/18/22 0332 11/19/22 0018 11/19/22 0813 11/19/22 0817 11/19/22 0950 11/19/22 1035 11/19/22 1047 11/19/22 1128 11/19/22 1221 11/19/22 1236  NA 139 137 135 136 137 140   < > 136 139 137 135 137 136  K 4.1 4.1 3.9 4.1 4.1 3.9   < > 4.1 4.4 5.0 5.6* 4.6 4.7  CL 106 102 105 101 103 106  --  104  --  103 103  --  103  CO2 28 25 24 22 27   --   --   --   --   --   --   --   --   GLUCOSE 104* 112* 130* 113* 109* 104*  --  124*  --  138* 142*  --  182*  BUN 16 17 15 18 17 15   --  15  --  15 16  --  15  CREATININE 1.18 1.01 1.08 1.12 1.22 0.90  --  1.00  --  0.90 0.90  --  1.00  CALCIUM 9.1 9.5 9.2 9.6 9.6  --   --   --   --   --   --   --   --    < > = values in this interval not displayed.   GFR: Estimated Creatinine Clearance: 122.5 mL/min (by C-G formula based on SCr of 1 mg/dL). Recent Labs  Lab 11/16/22 0428 11/17/22 0333 11/18/22 0740 11/19/22 0018  WBC 7.1 6.4 6.7 7.2    Liver Function Tests: Recent Labs  Lab 11/12/22 2000  AST 23  ALT 34  ALKPHOS 50  BILITOT 0.9  PROT 7.9  ALBUMIN 4.0   No results for input(s): "LIPASE", "AMYLASE" in the last 168 hours. No results for input(s): "AMMONIA" in the last 168 hours.  ABG    Component Value Date/Time   PHART 7.312 (L) 11/19/2022 1221   PCO2ART 46.6 11/19/2022 1221   PO2ART 72 (L) 11/19/2022 1221   HCO3 23.6 11/19/2022 1221   TCO2 23 11/19/2022 1236   ACIDBASEDEF 3.0 (H) 11/19/2022 1221   O2SAT 93 11/19/2022 1221     Coagulation Profile: Recent Labs  Lab 11/12/22 2000 11/18/22 1802  INR 1.0 1.0    Cardiac Enzymes: No results for input(s): "CKTOTAL", "CKMB", "CKMBINDEX", "TROPONINI" in the last 168 hours.  HbA1C: Hgb A1c MFr Bld  Date/Time Value Ref Range Status  11/12/2022 08:00 PM 6.2 (H) 4.8 - 5.6 % Final    Comment:    (NOTE)         Prediabetes: 5.7 - 6.4         Diabetes: >6.4          Glycemic control for adults with diabetes: <7.0   05/05/2018 10:47 AM 6.3 (H) 4.8 - 5.6 % Final  Comment:             Prediabetes: 5.7 - 6.4          Diabetes: >6.4          Glycemic control for adults with diabetes: <7.0     CBG: Recent Labs  Lab 11/13/22 1932 11/19/22 0535 11/19/22 1338  GLUCAP 117* 104* 132*    Review of Systems:   Intubated/sedated  Past Medical History:  He,  has a past medical history of GERD (gastroesophageal reflux disease), Hypertension, and No pertinent past medical history.   Surgical History:   Past Surgical History:  Procedure Laterality Date   CIRCUMCISION     EXCISION MASS NECK Right 06/12/2019   Procedure: EXCISION Superficial MASS NECK;  Surgeon: Duanne Guess, MD;  Location: [facility] ORS;  Service: General;  Laterality: Right;   EXCISION OF SKIN TAG Bilateral 06/12/2019   Procedure: EXCISION OF SKIN TAG on neck;  Surgeon: Duanne Guess, MD;  Location: [facility] ORS;  Service: General;  Laterality: Bilateral;   LEFT HEART CATH AND CORONARY ANGIOGRAPHY N/A 11/13/2022   Procedure: LEFT HEART CATH AND CORONARY ANGIOGRAPHY;  Surgeon: Yvonne Kendall, MD;  Location: [facility] INVASIVE CV LAB;  Service: Cardiovascular;  Laterality: N/A;   SHOULDER SURGERY Right 2014     Social History:   reports that he has never smoked. He has never used smokeless tobacco. He reports that he does not drink alcohol and does not use drugs.   Family History:  His family history includes Breast cancer in his mother; Cancer in his maternal uncle; Lung cancer in his mother.   Allergies No Known Allergies   Home Medications  Prior to Admission medications   Medication Sig Start Date End Date Taking? Authorizing Provider  aspirin EC 81 MG tablet Take 1 tablet (81 mg total) by mouth daily. Swallow whole. 11/15/22  Yes Lurene Shadow, MD  atorvastatin (LIPITOR) 80 MG tablet Take 1 tablet (80 mg total) by mouth daily at 6 PM. 11/15/22  Yes Sreenath, Sudheer B, MD   heparin 81191 UT/250ML infusion Inject 1,850 Units/hr into the vein continuous. 11/14/22  Yes Sreenath, Sudheer B, MD  lisinopril (ZESTRIL) 30 MG tablet Take 1 tablet (30 mg total) by mouth daily. 11/15/22  Yes Sreenath, Sudheer B, MD  metoprolol tartrate (LOPRESSOR) 25 MG tablet Take 1 tablet (25 mg total) by mouth 2 (two) times daily. 11/14/22  Yes Tresa Moore, MD     Critical care time: 32 mins

## 2022-11-19 NOTE — Op Note (Signed)
[address]       [place] [address]             [phone]                                          11/19/2022 Patient:  Alexander Simon Pre-Op Dx: Coronary artery disease     Stable angina   HTN   Morbid obesity   Congestive heart failure Post-op Dx:  same Procedure: CABG X 3.  LIMA LAD, RSVG diagonal.  Left radial to OM (Y graft off the RSVG)   Endoscopic greater saphenous vein harvest on the right Open left radial artery harvest   Surgeon and Role:      * Ermel Verne, Eliezer Lofts, MD - Primary    * Gaynelle Arabian , PA-C - assisting An experienced assistant was required given the complexity of this surgery and the standard of surgical care. The assistant was needed for exposure, dissection, suctioning, retraction of delicate tissues and sutures, instrument exchange and for overall help during this procedure.    Anesthesia  general EBL:  Blood Administration: none Xclamp Time:  72 min Pump Time:   Drains: 19 F blake drain: L, mediastinal  Wires: Ventricular Counts: correct   Indications: 52yo male with LM disease, and reduced LVEF. No significant valve disease. We discussed the risks and benefits of CABG with L radial artery. He is agreeable to proceed.   Findings: Good radial and vein.  Small LIMA.  Good LAD and diagonal.  Small OM  Operative Technique: All invasive lines were placed in pre-op holding.  After the risks, benefits and alternatives were thoroughly discussed, the patient was brought to the operative theatre.  Anesthesia was induced, and the patient was prepped and draped in normal sterile fashion.  An appropriate surgical pause was performed, and pre-operative antibiotics were dosed accordingly.  We began with simultaneous incisions along the right leg for harvesting of the greater saphenous vein, the left arm for the radial artery and the chest for the sternotomy.  In regards to the sternotomy, this was carried down with  bovie cautery, and the sternum was divided with a reciprocating saw.  Meticulous hemostasis was obtained.  The left internal thoracic artery was exposed and harvested in in pedicled fashion.  The patient was systemically heparinized, and the artery was divided distally, and placed in a papaverine sponge.    The sternal elevator was removed, and a retractor was placed.  The pericardium was divided in the midline and fashioned into a cradle with pericardial stitches.   After we confirmed an appropriate ACT, the ascending aorta was cannulated in standard fashion.  The right atrial appendage was used for venous cannulation site.  Cardiopulmonary bypass was initiated, and the heart retractor was placed. The cross clamp was applied, and a dose of anterograde cardioplegia was given with good arrest of the heart.  Next we exposed the lateral wall, and found a good target on the OM.  An end to side anastomosis with the radial graft was then created.  Next, we exposed the anterior wall of the heart and identified a good target on diagonal.   An arteriotomy was created.  The vein was anastomosed in an end to side fashion.  Finally, we exposed a good target on the  LAD, and fashioned an end to  side anastomosis between it and the LITA.  We began to re-warm, and a re-animation dose of cardioplegia was given.  The heart was de-aired, and the cross clamp was removed.  Meticulous hemostasis was obtained.    A partial occludding clamp was then placed on the ascending aorta, and we created an end to side anastomosis between it and the proximal vein graft.  The radial artery was jumped off the hood of the vein.  Rings were placed on the proximal anastomosis.  Hemostasis was obtained, and we separated from cardiopulmonary bypass without event.  The heparin was reversed with protamine.  Chest tubes and wires were placed, and the sternum was re-approximated with sternal wires.  The soft tissue and skin were re-approximated wth  absorbable suture.    The patient tolerated the procedure without any immediate complications, and was transferred to the ICU in guarded condition.  Alexander Simon

## 2022-11-19 NOTE — Procedures (Signed)
Extubation Procedure Note  Patient Details:   Name: Alexander Simon DOB: [birth year] MRN: [id number]   Airway Documentation:    Vent end date: 11/19/22 Vent end time: 1708   Evaluation  O2 sats: stable throughout Complications: No apparent complications Patient did tolerate procedure well. Bilateral Breath Sounds: Diminished   Yes  Patient was extubated to a 4L [place] without any complications, dyspnea or stridor noted. NIF: -35, VC: 1.2L, positive cuff leak prior to extubation. Patient was instructed on IS by RN x 5.   Carlynn Spry 11/19/2022, 5:08 PM

## 2022-11-19 NOTE — Anesthesia Procedure Notes (Signed)
Arterial Line Insertion Start/End9/23/2024 7:17 AM, 11/19/2022 7:22 AM Performed by: CRNA  Patient location: Pre-op. Preanesthetic checklist: patient identified, IV checked, site marked, risks and benefits discussed, surgical consent, monitors and equipment checked, pre-op evaluation, timeout performed and anesthesia consent Lidocaine 1% used for infiltration Right, radial was placed Catheter size: 20 G Hand hygiene performed  and maximum sterile barriers used   Attempts: 2 Procedure performed without using ultrasound guided technique. Following insertion, dressing applied. Post procedure assessment: normal and unchanged

## 2022-11-19 NOTE — Progress Notes (Signed)
     [address]       [place] [address]             [phone]       No events Vitals:   11/18/22 2345 11/19/22 0550  BP: (!) 130/91   Pulse: 83   Resp: 16   Temp: 98.4 F (36.9 C) 98.5 F (36.9 C)  SpO2: 98%    Alert NAD Sinus  EWOB  OR today for cabg  Corliss Skains

## 2022-11-20 ENCOUNTER — Inpatient Hospital Stay (HOSPITAL_COMMUNITY): Payer: Commercial Managed Care - PPO

## 2022-11-20 DIAGNOSIS — Z951 Presence of aortocoronary bypass graft: Secondary | ICD-10-CM

## 2022-11-20 DIAGNOSIS — Z9889 Other specified postprocedural states: Secondary | ICD-10-CM

## 2022-11-20 LAB — GLUCOSE, CAPILLARY
Glucose-Capillary: 110 mg/dL — ABNORMAL HIGH (ref 70–99)
Glucose-Capillary: 110 mg/dL — ABNORMAL HIGH (ref 70–99)
Glucose-Capillary: 113 mg/dL — ABNORMAL HIGH (ref 70–99)
Glucose-Capillary: 114 mg/dL — ABNORMAL HIGH (ref 70–99)
Glucose-Capillary: 120 mg/dL — ABNORMAL HIGH (ref 70–99)
Glucose-Capillary: 121 mg/dL — ABNORMAL HIGH (ref 70–99)
Glucose-Capillary: 124 mg/dL — ABNORMAL HIGH (ref 70–99)
Glucose-Capillary: 127 mg/dL — ABNORMAL HIGH (ref 70–99)
Glucose-Capillary: 128 mg/dL — ABNORMAL HIGH (ref 70–99)
Glucose-Capillary: 131 mg/dL — ABNORMAL HIGH (ref 70–99)

## 2022-11-20 LAB — CBC
HCT: 32.8 % — ABNORMAL LOW (ref 39.0–52.0)
HCT: 33.5 % — ABNORMAL LOW (ref 39.0–52.0)
Hemoglobin: 10.2 g/dL — ABNORMAL LOW (ref 13.0–17.0)
Hemoglobin: 10.5 g/dL — ABNORMAL LOW (ref 13.0–17.0)
MCH: 25.9 pg — ABNORMAL LOW (ref 26.0–34.0)
MCH: 26.1 pg (ref 26.0–34.0)
MCHC: 31.1 g/dL (ref 30.0–36.0)
MCHC: 31.3 g/dL (ref 30.0–36.0)
MCV: 83.2 fL (ref 80.0–100.0)
MCV: 83.3 fL (ref 80.0–100.0)
Platelets: 160 10*3/uL (ref 150–400)
Platelets: 166 10*3/uL (ref 150–400)
RBC: 3.94 MIL/uL — ABNORMAL LOW (ref 4.22–5.81)
RBC: 4.02 MIL/uL — ABNORMAL LOW (ref 4.22–5.81)
RDW: 13.8 % (ref 11.5–15.5)
RDW: 14 % (ref 11.5–15.5)
WBC: 9.3 10*3/uL (ref 4.0–10.5)
WBC: 11.5 10*3/uL — ABNORMAL HIGH (ref 4.0–10.5)
nRBC: 0 % (ref 0.0–0.2)
nRBC: 0 % (ref 0.0–0.2)

## 2022-11-20 LAB — BASIC METABOLIC PANEL
Anion gap: 8 (ref 5–15)
Anion gap: 6 (ref 5–15)
BUN: 13 mg/dL (ref 6–20)
BUN: 16 mg/dL (ref 6–20)
CO2: 23 mmol/L (ref 22–32)
CO2: 23 mmol/L (ref 22–32)
Calcium: 7.9 mg/dL — ABNORMAL LOW (ref 8.9–10.3)
Calcium: 7.9 mg/dL — ABNORMAL LOW (ref 8.9–10.3)
Chloride: 104 mmol/L (ref 98–111)
Chloride: 102 mmol/L (ref 98–111)
Creatinine, Ser: 0.96 mg/dL (ref 0.61–1.24)
Creatinine, Ser: 1.33 mg/dL — ABNORMAL HIGH (ref 0.61–1.24)
GFR, Estimated: >60 mL/min (ref >60)
GFR, Estimated: >60 mL/min (ref >60)
Glucose, Bld: 118 mg/dL — ABNORMAL HIGH (ref 70–99)
Glucose, Bld: 129 mg/dL — ABNORMAL HIGH (ref 70–99)
Potassium: 4.3 mmol/L (ref 3.5–5.1)
Potassium: 4.5 mmol/L (ref 3.5–5.1)
Sodium: 135 mmol/L (ref 135–145)
Sodium: 131 mmol/L — ABNORMAL LOW (ref 135–145)

## 2022-11-20 LAB — MAGNESIUM
Magnesium: 2.5 mg/dL — ABNORMAL HIGH (ref 1.7–2.4)
Magnesium: 2.6 mg/dL — ABNORMAL HIGH (ref 1.7–2.4)

## 2022-11-20 MED ORDER — LIDOCAINE 5 % EX PTCH
1.0000 | MEDICATED_PATCH | Freq: Every day | CUTANEOUS | Status: DC
  Administered 2022-11-20 – 2022-11-24 (×5): 1 via TRANSDERMAL
  Filled 2022-11-20 (×6): qty 1

## 2022-11-20 MED ORDER — KETOROLAC TROMETHAMINE 30 MG/ML IJ SOLN
15.0000 mg | Freq: Four times a day (QID) | INTRAMUSCULAR | Status: DC | PRN
  Administered 2022-11-22: 15 mg via INTRAVENOUS
  Filled 2022-11-20: qty 1

## 2022-11-20 MED ORDER — ENOXAPARIN SODIUM 40 MG/0.4ML IJ SOSY
40.0000 mg | PREFILLED_SYRINGE | Freq: Every day | INTRAMUSCULAR | Status: DC
  Administered 2022-11-20 – 2022-11-24 (×5): 40 mg via SUBCUTANEOUS
  Filled 2022-11-20 (×5): qty 0.4

## 2022-11-20 MED ORDER — AMLODIPINE BESYLATE 5 MG PO TABS
5.0000 mg | ORAL_TABLET | Freq: Every day | ORAL | Status: DC
  Administered 2022-11-20 – 2022-11-23 (×4): 5 mg via ORAL
  Filled 2022-11-20 (×4): qty 1

## 2022-11-20 MED ORDER — METHOCARBAMOL 500 MG PO TABS: 500.0000 mg | ORAL_TABLET | Freq: Three times a day (TID) | ORAL | Status: DC

## 2022-11-20 MED ORDER — HYDROMORPHONE HCL 1 MG/ML IJ SOLN
1.0000 mg | INTRAMUSCULAR | Status: DC | PRN
  Administered 2022-11-20: 2 mg via INTRAVENOUS
  Administered 2022-11-21 (×3): 1 mg via INTRAVENOUS
  Filled 2022-11-20: qty 1
  Filled 2022-11-20 (×2): qty 2
  Filled 2022-11-20: qty 1

## 2022-11-20 MED ORDER — INSULIN ASPART 100 UNIT/ML IJ SOLN
0.0000 [IU] | INTRAMUSCULAR | Status: DC
  Administered 2022-11-20 – 2022-11-21 (×4): 2 [IU] via SUBCUTANEOUS

## 2022-11-20 MED ORDER — KETOROLAC TROMETHAMINE 30 MG/ML IJ SOLN: 30.0000 mg | Freq: Four times a day (QID) | INTRAMUSCULAR | Status: DC | PRN

## 2022-11-20 MED ORDER — METHOCARBAMOL 500 MG PO TABS
500.0000 mg | ORAL_TABLET | Freq: Three times a day (TID) | ORAL | Status: DC
  Administered 2022-11-20 – 2022-11-25 (×16): 500 mg via ORAL
  Filled 2022-11-20 (×17): qty 1

## 2022-11-20 MED ORDER — KETOROLAC TROMETHAMINE 15 MG/ML IJ SOLN
15.0000 mg | Freq: Four times a day (QID) | INTRAMUSCULAR | Status: DC
  Administered 2022-11-20 – 2022-11-23 (×11): 15 mg via INTRAVENOUS
  Filled 2022-11-20 (×11): qty 1

## 2022-11-20 NOTE — TOC Initial Note (Signed)
Transition of Care [facility]) - Initial/Assessment Note    Patient Details  Name: Alexander Simon MRN: [id number] Date of Birth: 1970-06-02  Transition of Care [facility]) CM/SW Contact:    Gala Lewandowsky, RN Phone Number: 11/20/2022, 3:33 PM  Clinical Narrative: Patient presented for chest pain-POD-1 CABG. PTA patient was from home alone. Patient will have support of sister when he returns home for one month. Patient has no DME in the home. Case Manager will continue to follow for transition of care needs as the patient progresses.              Expected Discharge Plan: Home w Home Health Services Barriers to Discharge: Continued Medical Work up  Expected Discharge Plan and Services In-house Referral: NA Discharge Planning Services: CM Consult   Living arrangements for the past 2 months: Single Family Home                   DME Agency: NA    Prior Living Arrangements/Services Living arrangements for the past 2 months: Single Family Home Lives with:: Self Patient language and need for interpreter reviewed:: Yes Do you feel safe going back to the place where you live?: Yes      Need for Family Participation in Patient Care: Yes (Comment) Care giver support system in place?: Yes (comment)   Criminal Activity/Legal Involvement Pertinent to Current Situation/Hospitalization: No - Comment as needed  Activities of Daily Living Home Assistive Devices/Equipment: Eyeglasses ADL Screening (condition at time of admission) Is the patient deaf or have difficulty hearing?: No Does the patient have difficulty seeing, even when wearing glasses/contacts?: No Does the patient have difficulty concentrating, remembering, or making decisions?: No  Permission Sought/Granted Permission sought to share information with : Case Manager, Family Supports   Emotional Assessment Appearance:: Appears stated age Attitude/Demeanor/Rapport: Engaged Affect (typically observed): Appropriate   Alcohol /  Substance Use: Not Applicable Psych Involvement: No (comment)  Admission diagnosis:  Chest pain [R07.9] Patient Active Problem List   Diagnosis Date Noted   S/P CABG x 3 11/20/2022   H/O open surgical procurement of radial artery 11/20/2022   Dyslipidemia 11/13/2022   Non-ST elevation (NSTEMI) myocardial infarction (HCC) 11/12/2022   Obesity (BMI 30-39.9) 11/12/2022   Prediabetes 11/12/2022   Mass of right side of neck    Skin tags, multiple acquired    Hypertension 02/20/2018   Chest pain 03/09/2017   PCP:  Pcp, No Pharmacy:   [id number] - [place], [place] - [address] [place] [address] Phone: [phone] Fax: [phone]  Social Determinants of Health (SDOH) Social History: SDOH Screenings   Alcohol Screen: Low Risk  (05/02/2018)  Depression (PHQ2-9): Low Risk  (05/04/2019)  Tobacco Use: Low Risk  (11/19/2022)   Readmission Risk Interventions     No data to display

## 2022-11-20 NOTE — Progress Notes (Signed)
      [address]       [place] [address]             [phone]                 1 Day Post-Op Procedure(s) (LRB): CORONARY ARTERY BYPASS GRAFTING (CABG) X THREE  USING LEFT INTERNAL MAMMARY ARTERY, LEFT RADIAL ARTERY, AND ENDOSCOPICALLY HARVESTED RIGHT GREATER SAPHENOUS VEIN (N/A) TRANSESOPHAGEAL ECHOCARDIOGRAM (N/A) LEFT RADIAL ARTERY HARVEST (Left)   Events: No events _______________________________________________________________ Vitals: BP 127/60   Pulse 89   Temp 100.2 F (37.9 C)   Resp 19   Wt 130 kg   SpO2 100%   BMI 38.87 kg/m  Filed Weights   11/17/22 0532 11/18/22 0353 11/20/22 0500  Weight: 132.2 kg 134 kg 130 kg     - Neuro: alert NAD  - Cardiovascular: sinus  Drips: cardene 2.   CVP:  [8 mmHg-24 mmHg] 17 mmHg CO:  [5.6 L/min-12.8 L/min] 8 L/min CI:  [2.2 L/min/m2-5.1 L/min/m2] 3.2 L/min/m2  - Pulm: EWOB   ABG    Component Value Date/Time   PHART 7.336 (L) 11/19/2022 1847   PCO2ART 43.5 11/19/2022 1847   PO2ART 83 11/19/2022 1847   HCO3 23.0 11/19/2022 1847   TCO2 24 11/19/2022 1847   ACIDBASEDEF 2.0 11/19/2022 1847   O2SAT 95 11/19/2022 1847    - Abd: ND - Extremity: warm  .Intake/Output      09/23 0701 09/24 0700 09/24 0701 09/25 0700   I.V. (mL/kg) 3701.9 (28.5)    Blood 700    IV Piggyback 2191.9    Total Intake(mL/kg) 6593.8 (50.7)    Urine (mL/kg/hr) 2530 (0.8)    Blood 1400    Chest Tube 240    Total Output 4170    Net +2423.8            _______________________________________________________________ Labs:    Latest Ref Rng & Units 11/20/2022    3:48 AM 11/19/2022    7:29 PM 11/19/2022    6:47 PM  CBC  WBC 4.0 - 10.5 K/uL 9.3  9.3    Hemoglobin 13.0 - 17.0 g/dL 32.9  51.8  84.1   Hematocrit 39.0 - 52.0 % 32.8  33.6  32.0   Platelets 150 - 400 K/uL 160  164        Latest Ref Rng & Units 11/20/2022    3:48 AM 11/19/2022    7:29 PM 11/19/2022    6:47 PM  CMP  Glucose 70 - 99 mg/dL 660  630     BUN 6 - 20 mg/dL 13  12    Creatinine 1.60 - 1.24 mg/dL 1.09  3.23    Sodium 557 - 145 mmol/L 135  137  140   Potassium 3.5 - 5.1 mmol/L 4.3  4.5  4.5   Chloride 98 - 111 mmol/L 104  107    CO2 22 - 32 mmol/L 23  24    Calcium 8.9 - 10.3 mg/dL 7.9  7.6      CXR: PV Congestion   _______________________________________________________________  Assessment and Plan: POD 1 s/p CABG  Neuro: adjusting pain meds CV: on A/S/BB.  Titrating BB.  Switching to amlodipine.  Will remove pacing wires Pulm: IS, ambulation Renal: will diurese GI: on diet Heme: stable ID: [email or web address] Endo: SSI Dispo: continue ICU care   Alexander Simon 11/20/2022 7:38 AM

## 2022-11-20 NOTE — Consult Note (Signed)
NAME:  Alexander Simon, MRN:  [id number], DOB:  Apr 22, 1970, LOS: 6 ADMISSION DATE:  11/14/2022, CONSULTATION DATE:  11/19/22 REFERRING MD:  Cliffton Asters CHIEF COMPLAINT: Chest pain  History of Present Illness:  This is a 52 year old man who is presenting with chest pain.  Subsequent workup revealed multifocal coronary artery disease.  He underwent CABG x 3 LIMA LAD, RSVG diagonal, left radial OM Y graft off RSVG, right saphenous vein harvest, left radial artery harvest.  He arrives to the unit intubated and sedated and pulmonary critical care is consulted for subsequent management.  Operative course complicated by hypoxemia and difficult anatomy related to small aorta and overriding pulmonary artery limiting space for grafting.  Total pump time 93 minutes, cross-clamp time 71 minutes.  Pertinent  Medical History  Gastric reflux Morbid obesity Hypertension  Significant Hospital Events: Including procedures, antibiotic start and stop dates in addition to other pertinent events   9/17 cath Multivessel CAD, including 50% ostial LMCA stenosis, mild luminal irregularities involving the LAD, 90% ostial and mid/distal LCx lesions, and sequential 50-60% RCA stenoses.  LVEDP 18 mmHg.  Interim History / Subjective:  Extubated, having some pain issues.  Objective   Blood pressure (!) 147/60, pulse 91, temperature 99.7 F (37.6 C), resp. rate (!) 25, weight 130 kg, SpO2 100%. CVP:  [8 mmHg-24 mmHg] 13 mmHg CO:  [5.6 L/min-12.8 L/min] 7.9 L/min CI:  [2.2 L/min/m2-5.1 L/min/m2] 3.2 L/min/m2  Vent Mode: CPAP;PSV FiO2 (%):  [40 %-100 %] 40 % Set Rate:  [4 bmp-20 bmp] 4 bmp Vt Set:  [620 mL] 620 mL PEEP:  [5 cmH20-10 cmH20] 5 cmH20 Pressure Support:  [10 cmH20] 10 cmH20 Plateau Pressure:  [25 cmH20] 25 cmH20   Intake/Output Summary (Last 24 hours) at 11/20/2022 1215 Last data filed at 11/20/2022 1100 Gross per 24 hour  Intake 6026.56 ml  Output 3835 ml  Net 2191.56 ml   Filed Weights   11/17/22  0532 11/18/22 0353 11/20/22 0500  Weight: 132.2 kg 134 kg 130 kg    Examination: No distress sitting in chair Multiple drains in place with minimal output Sternotomy dressed L arm and R leg look okay Lungs diminished at bases  CBG ok BMP ok CBC ok CXR low lung volumes  Resolved Hospital Problem list   Not applicable  Assessment & Plan:  Postoperative ventilator management- resolved Status post three-vessel CABG 11/19/2022 Status post right greater saphenous vein harvest 11/19/2022 Status post left radial harvest 11/19/2022 Morbid obesity Hypertension Reflux  -Multimodal pain strategy -Braced coughing, incentive spirometry -Monitor drain output, removal per TCTS -Calcium channel blocker per usual radial harvest protocol -We will follow with you while in ICU  Myrla Halsted MD PCCM

## 2022-11-20 NOTE — Discharge Instructions (Signed)
Discharge Instructions:  1. You may shower, please wash incisions daily with soap and water and keep dry.  If you wish to cover wounds with dressing you may do so but please keep clean and change daily.  No tub baths or swimming until incisions have completely healed.  If your incisions become red or develop any drainage please call our office at [phone]  2. No Driving until cleared by Dr. Lucilla Lame office and you are no longer using narcotic pain medications  3. Monitor your weight daily.. Please use the same scale and weigh at same time... If you gain 5-10 lbs in 48 hours with associated lower extremity swelling, please contact our office at [phone]  4. Fever of 101.5 for at least 24 hours with no source, please contact our office at [phone]  5. Activity- up as tolerated, please walk at least 3 times per day.  Avoid strenuous activity, no lifting, pushing, or pulling with your arms over 8-10 lbs for a minimum of 6 weeks  6. If any questions or concerns arise, please do not hesitate to contact our office at [phone]

## 2022-11-21 ENCOUNTER — Inpatient Hospital Stay (HOSPITAL_COMMUNITY): Payer: Commercial Managed Care - PPO

## 2022-11-21 LAB — GLUCOSE, CAPILLARY
Glucose-Capillary: 133 mg/dL — ABNORMAL HIGH (ref 70–99)
Glucose-Capillary: 107 mg/dL — ABNORMAL HIGH (ref 70–99)
Glucose-Capillary: 113 mg/dL — ABNORMAL HIGH (ref 70–99)
Glucose-Capillary: 107 mg/dL — ABNORMAL HIGH (ref 70–99)
Glucose-Capillary: 113 mg/dL — ABNORMAL HIGH (ref 70–99)

## 2022-11-21 LAB — CBC
HCT: 31.3 % — ABNORMAL LOW (ref 39.0–52.0)
Hemoglobin: 9.6 g/dL — ABNORMAL LOW (ref 13.0–17.0)
MCH: 25.6 pg — ABNORMAL LOW (ref 26.0–34.0)
MCHC: 30.7 g/dL (ref 30.0–36.0)
MCV: 83.5 fL (ref 80.0–100.0)
Platelets: 158 10*3/uL (ref 150–400)
RBC: 3.75 MIL/uL — ABNORMAL LOW (ref 4.22–5.81)
RDW: 14.1 % (ref 11.5–15.5)
WBC: 13 10*3/uL — ABNORMAL HIGH (ref 4.0–10.5)
nRBC: 0 % (ref 0.0–0.2)

## 2022-11-21 LAB — BASIC METABOLIC PANEL
Anion gap: 7 (ref 5–15)
BUN: 23 mg/dL — ABNORMAL HIGH (ref 6–20)
CO2: 25 mmol/L (ref 22–32)
Calcium: 8.4 mg/dL — ABNORMAL LOW (ref 8.9–10.3)
Chloride: 99 mmol/L (ref 98–111)
Creatinine, Ser: 1.55 mg/dL — ABNORMAL HIGH (ref 0.61–1.24)
GFR, Estimated: 54 mL/min — ABNORMAL LOW (ref >60)
Glucose, Bld: 132 mg/dL — ABNORMAL HIGH (ref 70–99)
Potassium: 4.2 mmol/L (ref 3.5–5.1)
Sodium: 131 mmol/L — ABNORMAL LOW (ref 135–145)

## 2022-11-21 MED ORDER — SODIUM CHLORIDE 0.9 % IV SOLN: 250.0000 mL | INTRAVENOUS | Status: DC | PRN

## 2022-11-21 MED ORDER — FUROSEMIDE 40 MG PO TABS
40.0000 mg | ORAL_TABLET | Freq: Every day | ORAL | Status: DC
  Administered 2022-11-21 – 2022-11-25 (×5): 40 mg via ORAL
  Filled 2022-11-21 (×5): qty 1

## 2022-11-21 MED ORDER — POTASSIUM CHLORIDE CRYS ER 20 MEQ PO TBCR
40.0000 meq | EXTENDED_RELEASE_TABLET | Freq: Every day | ORAL | Status: DC
  Administered 2022-11-21 – 2022-11-23 (×3): 40 meq via ORAL
  Filled 2022-11-21 (×3): qty 2

## 2022-11-21 MED ORDER — SODIUM CHLORIDE 0.9% FLUSH
3.0000 mL | Freq: Two times a day (BID) | INTRAVENOUS | Status: DC
  Administered 2022-11-22 – 2022-11-25 (×7): 3 mL via INTRAVENOUS

## 2022-11-21 MED ORDER — SODIUM CHLORIDE 0.9% FLUSH: 3.0000 mL | INTRAVENOUS | Status: DC | PRN

## 2022-11-21 MED ORDER — ~~LOC~~ CARDIAC SURGERY, PATIENT & FAMILY EDUCATION: Freq: Once | Status: AC

## 2022-11-21 MED FILL — Heparin Sodium (Porcine) Inj 1000 Unit/ML: Qty: 1000 | Status: AC

## 2022-11-21 MED FILL — Potassium Chloride Inj 2 mEq/ML: INTRAVENOUS | Qty: 40 | Status: AC

## 2022-11-21 MED FILL — Lidocaine HCl Local Preservative Free (PF) Inj 2%: INTRAMUSCULAR | Qty: 14 | Status: AC

## 2022-11-21 NOTE — Progress Notes (Signed)
      [address]       [place] [address]             [phone]                 2 Days Post-Op Procedure(s) (LRB): CORONARY ARTERY BYPASS GRAFTING (CABG) X THREE  USING LEFT INTERNAL MAMMARY ARTERY, LEFT RADIAL ARTERY, AND ENDOSCOPICALLY HARVESTED RIGHT GREATER SAPHENOUS VEIN (N/A) TRANSESOPHAGEAL ECHOCARDIOGRAM (N/A) LEFT RADIAL ARTERY HARVEST (Left)   Events: No events _______________________________________________________________ Vitals: BP 126/74   Pulse 90   Temp 98.2 F (36.8 C) (Oral)   Resp 16   Wt 135.5 kg   SpO2 96%   BMI 40.51 kg/m  Filed Weights   11/18/22 0353 11/20/22 0500 11/21/22 0500  Weight: 134 kg 130 kg 135.5 kg     - Neuro: alert NAD  - Cardiovascular: sinus  Drips:none   CVP:  [15 mmHg-20 mmHg] 15 mmHg  - Pulm: EWOB   ABG    Component Value Date/Time   PHART 7.336 (L) 11/19/2022 1847   PCO2ART 43.5 11/19/2022 1847   PO2ART 83 11/19/2022 1847   HCO3 23.0 11/19/2022 1847   TCO2 24 11/19/2022 1847   ACIDBASEDEF 2.0 11/19/2022 1847   O2SAT 95 11/19/2022 1847    - Abd: ND - Extremity: warm  .Intake/Output      09/24 0701 09/25 0700 09/25 0701 09/26 0700   P.O. 720    I.V. (mL/kg) 667.5 (4.9) 7.2 (0.1)   Blood     IV Piggyback 300.1    Total Intake(mL/kg) 1687.5 (12.5) 7.2 (0.1)   Urine (mL/kg/hr) 1135 (0.3)    Blood     Chest Tube 170    Total Output 1305    Net +382.5 +7.2           _______________________________________________________________ Labs:    Latest Ref Rng & Units 11/21/2022    4:43 AM 11/20/2022    3:19 PM 11/20/2022    3:48 AM  CBC  WBC 4.0 - 10.5 K/uL 13.0  11.5  9.3   Hemoglobin 13.0 - 17.0 g/dL 9.6  21.3  08.6   Hematocrit 39.0 - 52.0 % 31.3  33.5  32.8   Platelets 150 - 400 K/uL 158  166  160       Latest Ref Rng & Units 11/21/2022    4:43 AM 11/20/2022    3:19 PM 11/20/2022    3:48 AM  CMP  Glucose 70 - 99 mg/dL 578  469  629   BUN 6 - 20 mg/dL 23  16  13    Creatinine  0.61 - 1.24 mg/dL 5.28  4.13  2.44   Sodium 135 - 145 mmol/L 131  131  135   Potassium 3.5 - 5.1 mmol/L 4.2  4.5  4.3   Chloride 98 - 111 mmol/L 99  102  104   CO2 22 - 32 mmol/L 25  23  23    Calcium 8.9 - 10.3 mg/dL 8.4  7.9  7.9     CXR: PV Congestion   _______________________________________________________________  Assessment and Plan: POD 2 s/p CABG  Neuro: adjusting pain meds CV: on A/S/BB.  amlodipine Will remove pacing wires Pulm: IS, ambulation Renal: will diurese GI: on diet Heme: stable ID: afebrile Endo: SSI Dispo: floor   Dwaine Pringle O Lindaann Gradilla 11/21/2022 11:39 AM

## 2022-11-21 NOTE — Plan of Care (Signed)
  Problem: Education: Goal: Understanding of cardiac disease, CV risk reduction, and recovery process will improve Outcome: Progressing   Problem: Activity: Goal: Ability to tolerate increased activity will improve Outcome: Progressing   Problem: Cardiac: Goal: Ability to achieve and maintain adequate cardiovascular perfusion will improve Outcome: Progressing   Problem: Education: Goal: Understanding of CV disease, CV risk reduction, and recovery process will improve Outcome: Progressing   Problem: Activity: Goal: Ability to return to baseline activity level will improve Outcome: Progressing   Problem: Health Behavior/Discharge Planning: Goal: Ability to safely manage health-related needs after discharge will improve Outcome: Progressing   Problem: Activity: Goal: Risk for activity intolerance will decrease Outcome: Progressing   Problem: Nutrition: Goal: Adequate nutrition will be maintained Outcome: Progressing   Problem: Coping: Goal: Level of anxiety will decrease Outcome: Progressing   Problem: Pain Managment: Goal: General experience of comfort will improve Outcome: Progressing   Problem: Safety: Goal: Ability to remain free from injury will improve Outcome: Progressing

## 2022-11-21 NOTE — Progress Notes (Signed)
NAME:  Alexander Simon, MRN:  [id number], DOB:  [birth year], LOS: 7 ADMISSION DATE:  11/14/2022, CONSULTATION DATE:  11/19/22 REFERRING MD:  Cliffton Asters CHIEF COMPLAINT: Chest pain  History of Present Illness:  This is a 52 year old man who is presenting with chest pain.  Subsequent workup revealed multifocal coronary artery disease.  He underwent CABG x 3 LIMA LAD, RSVG diagonal, left radial OM Y graft off RSVG, right saphenous vein harvest, left radial artery harvest.  He arrives to the unit intubated and sedated and pulmonary critical care is consulted for subsequent management.  Operative course complicated by hypoxemia and difficult anatomy related to small aorta and overriding pulmonary artery limiting space for grafting.  Total pump time 93 minutes, cross-clamp time 71 minutes.  Pertinent  Medical History  Gastric reflux Morbid obesity Hypertension  Significant Hospital Events: Including procedures, antibiotic start and stop dates in addition to other pertinent events   9/17 cath Multivessel CAD, including 50% ostial LMCA stenosis, mild luminal irregularities involving the LAD, 90% ostial and mid/distal LCx lesions, and sequential 50-60% RCA stenoses.  LVEDP 18 mmHg.  Interim History / Subjective:  Doing well. Pain better controlled.  Objective   Blood pressure 119/63, pulse 91, temperature 98.2 F (36.8 C), temperature source Oral, resp. rate 16, weight 135.5 kg, SpO2 96%. CVP:  [10 mmHg-20 mmHg] 15 mmHg CO:  [7.5 L/min-10.6 L/min] 7.9 L/min CI:  [3 L/min/m2-4.2 L/min/m2] 3.2 L/min/m2      Intake/Output Summary (Last 24 hours) at 11/21/2022 0910 Last data filed at 11/21/2022 0800 Gross per 24 hour  Intake 1454.71 ml  Output 1305 ml  Net 149.71 ml   Filed Weights   11/18/22 0353 11/20/22 0500 11/21/22 0500  Weight: 134 kg 130 kg 135.5 kg    Examination: No distress sitting in chair Mediastinal drains minimal output Sternotomy dressed L arm and R leg look  okay/stable Lungs seem clearer  Cr up slightly Mildly increased WBC CXR stable L hemidiaphragm elevation (present on admission) Resolved Hospital Problem list   Not applicable  Assessment & Plan:  Postoperative ventilator management- resolved Status post three-vessel CABG 11/19/2022 Status post right greater saphenous vein harvest 11/19/2022 Status post left radial harvest 11/19/2022 Morbid obesity Hypertension Reflux  -Multimodal pain strategy -Braced coughing, incentive spirometry -Monitor drain output, hopefully remove today? -Calcium channel blocker per usual radial harvest protocol -We will follow with you while in ICU, potential transfer later today  Myrla Halsted MD PCCM

## 2022-11-21 NOTE — Progress Notes (Signed)
CARDIAC REHAB PHASE I   PRE:  Rate/Rhythm: 78 SR    BP: sitting 132/78    SpO2: 94 RA  MODE:  Ambulation: 370 ft   POST:  Rate/Rhythm: 113 ST    BP: sitting 180/89     SpO2: 86 RA, 86 1L, 93 2L  Pt stood from recliner independently. Walked with Carley Hammed with standby assist. C/o significant sternal pain. Coughing with phlegm production, esp after walk. SpO2 decreased and reapplied O2. Assisted RN with getting pt to bed. Pt is practicing IS, doing well. Will f/u tomorrow. [phone]   Ethelda Chick BS, ACSM-CEP 11/21/2022 2:40 PM

## 2022-11-22 ENCOUNTER — Other Ambulatory Visit: Payer: Self-pay

## 2022-11-22 LAB — CBC
HCT: 27.4 % — ABNORMAL LOW (ref 39.0–52.0)
Hemoglobin: 8.7 g/dL — ABNORMAL LOW (ref 13.0–17.0)
MCH: 26.3 pg (ref 26.0–34.0)
MCHC: 31.8 g/dL (ref 30.0–36.0)
MCV: 82.8 fL (ref 80.0–100.0)
Platelets: 144 10*3/uL — ABNORMAL LOW (ref 150–400)
RBC: 3.31 MIL/uL — ABNORMAL LOW (ref 4.22–5.81)
RDW: 14.1 % (ref 11.5–15.5)
WBC: 9.1 10*3/uL (ref 4.0–10.5)
nRBC: 0 % (ref 0.0–0.2)

## 2022-11-22 LAB — TYPE AND SCREEN
ABO/RH(D): O POS
Antibody Screen: NEGATIVE
Unit division: 0

## 2022-11-22 LAB — BASIC METABOLIC PANEL
Anion gap: 4 — ABNORMAL LOW (ref 5–15)
BUN: 25 mg/dL — ABNORMAL HIGH (ref 6–20)
CO2: 28 mmol/L (ref 22–32)
Calcium: 8.5 mg/dL — ABNORMAL LOW (ref 8.9–10.3)
Chloride: 100 mmol/L (ref 98–111)
Creatinine, Ser: 1.45 mg/dL — ABNORMAL HIGH (ref 0.61–1.24)
GFR, Estimated: 58 mL/min — ABNORMAL LOW (ref >60)
Glucose, Bld: 110 mg/dL — ABNORMAL HIGH (ref 70–99)
Potassium: 4.6 mmol/L (ref 3.5–5.1)
Sodium: 132 mmol/L — ABNORMAL LOW (ref 135–145)

## 2022-11-22 LAB — BPAM RBC
Blood Product Expiration Date: 202410202359
ISSUE DATE / TIME: 202409211601
Unit Type and Rh: 5100

## 2022-11-22 LAB — GLUCOSE, CAPILLARY
Glucose-Capillary: 106 mg/dL — ABNORMAL HIGH (ref 70–99)
Glucose-Capillary: 94 mg/dL (ref 70–99)
Glucose-Capillary: 99 mg/dL (ref 70–99)

## 2022-11-22 MED ORDER — METOPROLOL TARTRATE 25 MG PO TABS
25.0000 mg | ORAL_TABLET | Freq: Two times a day (BID) | ORAL | Status: DC
  Administered 2022-11-22 (×2): 25 mg via ORAL
  Filled 2022-11-22 (×2): qty 1

## 2022-11-22 MED ORDER — METOPROLOL TARTRATE 25 MG/10 ML ORAL SUSPENSION: 12.5000 mg | Freq: Two times a day (BID) | ORAL | Status: DC

## 2022-11-22 MED ORDER — ASPIRIN 81 MG PO TBEC
81.0000 mg | DELAYED_RELEASE_TABLET | Freq: Every day | ORAL | Status: DC
  Administered 2022-11-22 – 2022-11-25 (×4): 81 mg via ORAL
  Filled 2022-11-22 (×4): qty 1

## 2022-11-22 MED ORDER — CLOPIDOGREL BISULFATE 75 MG PO TABS
75.0000 mg | ORAL_TABLET | Freq: Every day | ORAL | Status: DC
  Administered 2022-11-22 – 2022-11-25 (×4): 75 mg via ORAL
  Filled 2022-11-22 (×4): qty 1

## 2022-11-22 NOTE — Progress Notes (Signed)
Mobility Specialist Progress Note:   11/22/22 1640  Mobility  Activity Ambulated with assistance in hallway  Level of Assistance Standby assist, set-up cues, supervision of patient - no hands on  Assistive Device Four wheel walker  Distance Ambulated (ft) 310 ft  RUE Weight Bearing NWB  LUE Weight Bearing NWB  Mobility Referral Yes  $Mobility charge 1 Mobility  Mobility Specialist Start Time (ACUTE ONLY) 1625  Mobility Specialist Stop Time (ACUTE ONLY) 1640  Mobility Specialist Time Calculation (min) (ACUTE ONLY) 15 min   Pre Mobility: 99 HR  During Mobility: 108 HR  Post Mobility: 96 HR   Pt received in chair, agreeable to mobility. Seated rest break required d/t SOB. Pursed lip breathing encouraged. Pt able to ambulate back to chair and left with call bell in reach and all needs met.   Leory Plowman  Mobility Specialist Please contact via [employer] office at [phone]

## 2022-11-22 NOTE — Progress Notes (Addendum)
[address]       [place] [address]             [phone]      3 Days Post-Op Procedure(s) (LRB): CORONARY ARTERY BYPASS GRAFTING (CABG) X THREE  USING LEFT INTERNAL MAMMARY ARTERY, LEFT RADIAL ARTERY, AND ENDOSCOPICALLY HARVESTED RIGHT GREATER SAPHENOUS VEIN (N/A) TRANSESOPHAGEAL ECHOCARDIOGRAM (N/A) LEFT RADIAL ARTERY HARVEST (Left) Subjective: Patient states his chest is sore but he has no other complaints. Walked 3 times yesterday.  Objective: Vital signs in last 24 hours: Temp:  [98.1 F (36.7 C)-100.1 F (37.8 C)] 98.4 F (36.9 C) (09/26 0427) Pulse Rate:  [85-105] 105 (09/25 1712) Cardiac Rhythm: Sinus tachycardia (09/25 1900) Resp:  [14-28] 20 (09/26 0427) BP: (118-152)/(63-82) 121/73 (09/26 0427) SpO2:  [92 %-99 %] 94 % (09/25 2015) Weight:  [135.3 kg] 135.3 kg (09/26 0427)  Hemodynamic parameters for last 24 hours:    Intake/Output from previous day: 09/25 0701 - 09/26 0700 In: 368.2 [P.O.:358; I.V.:10.2] Out: 750 [Urine:750] Intake/Output this shift: Total I/O In: 243 [P.O.:240; I.V.:3] Out: 750 [Urine:750]  General appearance: alert, cooperative, and no distress Neurologic: intact Heart: regular rate and rhythm-sinus tachycardia, no murmur Lungs: slightly diminished bibasilar breath sounds  Abdomen: soft, non-tender; bowel sounds normal; no masses,  no organomegaly Extremities: edema 1+ Wound: Clean and dry without sign of infection. Left hand is neurovascularly intact  Lab Results: Recent Labs    11/21/22 0443 11/22/22 0400  WBC 13.0* 9.1  HGB 9.6* 8.7*  HCT 31.3* 27.4*  PLT 158 144*   BMET:  Recent Labs    11/21/22 0443 11/22/22 0400  NA 131* 132*  K 4.2 4.6  CL 99 100  CO2 25 28  GLUCOSE 132* 110*  BUN 23* 25*  CREATININE 1.55* 1.45*  CALCIUM 8.4* 8.5*    PT/INR:  Recent Labs    11/19/22 1324  LABPROT 16.5*  INR 1.3*   ABG    Component Value Date/Time   PHART 7.336 (L) 11/19/2022 1847   HCO3  23.0 11/19/2022 1847   TCO2 24 11/19/2022 1847   ACIDBASEDEF 2.0 11/19/2022 1847   O2SAT 95 11/19/2022 1847   CBG (last 3)  Recent Labs    11/21/22 2014 11/22/22 0015 11/22/22 0429  GLUCAP 113* 106* 94    Assessment/Plan: S/P Procedure(s) (LRB): CORONARY ARTERY BYPASS GRAFTING (CABG) X THREE  USING LEFT INTERNAL MAMMARY ARTERY, LEFT RADIAL ARTERY, AND ENDOSCOPICALLY HARVESTED RIGHT GREATER SAPHENOUS VEIN (N/A) TRANSESOPHAGEAL ECHOCARDIOGRAM (N/A) LEFT RADIAL ARTERY HARVEST (Left)  CV: NSR-ST, HR 90s to low 100s. On Norvasc 5mg  daily and Lopressor 12.5mg  BID. BP 118-152, mostly controlled. Will titrate Lopressor. Hx of NSTEMI, will start Plavix.   Pulm: Saturating well on RA. CXR with bibasilar atelectasis. Encourage IS and ambulation.   GI: +BM, good appetite  Endo: Preop A1C 6.2. CBGs controlled on SSI PRN. Will d/c SSI and CBGs. Pt to follow up with medicine doctor as an outpatient.   Renal: AKI. Cr 1.45, trending down. +7lbs, continue PO Lasix 40mg  daily  Mild Hyponatremia: Na 132, will monitor  ID: Reactive leukocytosis resolved, Tmax 100.1, mostly 98s.  Expected postop ABLA: H/H 8.7/27.4 trending down, not clinically significant at this point  Postoperative thrombocytopenia: Plt 144,000, will monitor.   DVT Prophylaxis: Lovenox  Dispo: Continue diuresis and monitor kidney function. Add Plavix for NSTEMI. Hopefully d/c home in next 48 hours.    LOS: 8 days    Jenny Reichmann, PA-C 11/22/2022  Agree with above Titrating meds Dispo planning  Bethania Schlotzhauer O Deundre Thong

## 2022-11-22 NOTE — Progress Notes (Signed)
Mobility Specialist Progress Note:   11/22/22 1231  Mobility  Activity Ambulated with assistance in hallway  Level of Assistance Contact guard assist, steadying assist  Assistive Device Four wheel walker  Distance Ambulated (ft) 310 ft  RUE Weight Bearing NWB  LUE Weight Bearing NWB  Activity Response Tolerated well  Mobility Referral Yes  $Mobility charge 1 Mobility  Mobility Specialist Start Time (ACUTE ONLY) 1210  Mobility Specialist Stop Time (ACUTE ONLY) 1225  Mobility Specialist Time Calculation (min) (ACUTE ONLY) 15 min   Pre Mobility: 93 HR  During Mobility: 101 HR Post Mobility: 93 HR   Pt received in chair, agreeable to mobility. Seated rest break required d/t fatigue. Pt c/o chest soreness and slight SOB. Pursed lip breathing encouraged. Pt returned to chair with call bell in reach and all needs met.  Leory Plowman  Mobility Specialist Please contact via [employer] office at [phone]

## 2022-11-22 NOTE — Progress Notes (Signed)
CARDIAC REHAB PHASE I   PRE:  Rate/Rhythm: 97 NSR  BP:  Sitting: 162/79      SaO2: 93 RA  MODE:  Ambulation: 320 ft   AD:   rollator  POST:  Rate/Rhythm: 109 ST  BP:  Sitting: 181/77      SaO2: 95 RA  Pt agreeable to ambulation, pt able to stand without assistance, walked to the end of long hallway before needing sitting rest break and returned to chair in room after.   Faustino Congress  MS, ACSM-CEP 8:50 AM 11/22/2022    Service time is from 0825 to 0850.

## 2022-11-23 DIAGNOSIS — I5021 Acute systolic (congestive) heart failure: Secondary | ICD-10-CM

## 2022-11-23 LAB — BASIC METABOLIC PANEL
Anion gap: 13 (ref 5–15)
BUN: 22 mg/dL — ABNORMAL HIGH (ref 6–20)
CO2: 22 mmol/L (ref 22–32)
Calcium: 9 mg/dL (ref 8.9–10.3)
Chloride: 103 mmol/L (ref 98–111)
Creatinine, Ser: 1.15 mg/dL (ref 0.61–1.24)
GFR, Estimated: >60 mL/min (ref >60)
Glucose, Bld: 126 mg/dL — ABNORMAL HIGH (ref 70–99)
Potassium: 5.6 mmol/L — ABNORMAL HIGH (ref 3.5–5.1)
Sodium: 138 mmol/L (ref 135–145)

## 2022-11-23 MED ORDER — CARVEDILOL 12.5 MG PO TABS
12.5000 mg | ORAL_TABLET | Freq: Two times a day (BID) | ORAL | Status: DC
  Administered 2022-11-23 – 2022-11-25 (×4): 12.5 mg via ORAL
  Filled 2022-11-23 (×4): qty 1

## 2022-11-23 MED ORDER — AMLODIPINE BESYLATE 5 MG PO TABS
5.0000 mg | ORAL_TABLET | Freq: Every day | ORAL | Status: DC
  Administered 2022-11-23 – 2022-11-25 (×3): 5 mg via ORAL
  Filled 2022-11-23 (×3): qty 1

## 2022-11-23 MED ORDER — METOPROLOL TARTRATE 25 MG/10 ML ORAL SUSPENSION: 12.5000 mg | Freq: Two times a day (BID) | ORAL | Status: DC

## 2022-11-23 MED ORDER — SACUBITRIL-VALSARTAN 24-26 MG PO TABS
1.0000 | ORAL_TABLET | Freq: Two times a day (BID) | ORAL | Status: DC
  Administered 2022-11-23 – 2022-11-25 (×5): 1 via ORAL
  Filled 2022-11-23 (×5): qty 1

## 2022-11-23 MED ORDER — METOPROLOL TARTRATE 50 MG PO TABS
50.0000 mg | ORAL_TABLET | Freq: Two times a day (BID) | ORAL | Status: DC
  Administered 2022-11-23: 50 mg via ORAL
  Filled 2022-11-23: qty 1

## 2022-11-23 NOTE — Progress Notes (Addendum)
Cardiology Progress Note  Patient ID: Alexander Simon MRN: [id number] DOB: [birth year] Date of Encounter: 11/23/2022  Primary Cardiologist: Yvonne Kendall, MD  Subjective   Chief Complaint: SOB  HPI: Blood pressure elevated.  Appears volume up.  Reports shortness of breath.  ROS:  All other ROS reviewed and negative. Pertinent positives noted in the HPI.     Inpatient Medications  Scheduled Meds:  acetaminophen  1,000 mg Oral Q6H   Or   acetaminophen (TYLENOL) oral liquid 160 mg/5 mL  1,000 mg Per Tube Q6H   amLODipine  5 mg Oral Daily   aspirin EC  81 mg Oral Daily   atorvastatin  80 mg Oral Daily   bisacodyl  10 mg Oral Daily   Or   bisacodyl  10 mg Rectal Daily   Chlorhexidine Gluconate Cloth  6 each Topical Daily   clopidogrel  75 mg Oral Daily   docusate sodium  200 mg Oral Daily   enoxaparin (LOVENOX) injection  40 mg Subcutaneous QHS   furosemide  40 mg Oral Daily   ketorolac  15 mg Intravenous Q6H   lidocaine  1 patch Transdermal Daily   methocarbamol  500 mg Oral TID   metoprolol tartrate  50 mg Oral BID   Or   metoprolol tartrate  12.5 mg Per Tube BID   pantoprazole  40 mg Oral Daily   potassium chloride  40 mEq Oral Daily   sodium chloride flush  3 mL Intravenous Q12H   sodium chloride flush  3 mL Intravenous Q12H   Continuous Infusions:  sodium chloride Stopped (11/20/22 1806)   sodium chloride     sodium chloride     sodium chloride     albumin human 999 mL/hr at 11/20/22 1000   lactated ringers     lactated ringers Stopped (11/21/22 0621)   PRN Meds: sodium chloride, sodium chloride, albumin human, diclofenac Sodium, ketorolac, metoprolol tartrate, midazolam, ondansetron (ZOFRAN) IV, mouth rinse, oxyCODONE, sodium chloride flush, sodium chloride flush, traMADol   Vital Signs   Vitals:   11/22/22 2053 11/23/22 0009 11/23/22 0500 11/23/22 0721  BP: (!) 170/75 (!) 149/83  (!) 177/79  Pulse: 93 90  99  Resp: 16   18  Temp: 98.3 F (36.8 C) 99 F  (37.2 C)  99.3 F (37.4 C)  TempSrc: Oral Oral  Oral  SpO2: 92% 93%  97%  Weight:   134.4 kg     Intake/Output Summary (Last 24 hours) at 11/23/2022 1100 Last data filed at 11/23/2022 0800 Gross per 24 hour  Intake --  Output 400 ml  Net -400 ml      11/23/2022    5:00 AM 11/22/2022    4:27 AM 11/21/2022    5:00 AM  Last 3 Weights  Weight (lbs) 296 lb 4.8 oz 298 lb 4.5 oz 298 lb 11.6 oz  Weight (kg) 134.4 kg 135.3 kg 135.5 kg      Telemetry  Overnight telemetry shows sinus rhythm 90s, which I personally reviewed.   Physical Exam   Vitals:   11/22/22 2053 11/23/22 0009 11/23/22 0500 11/23/22 0721  BP: (!) 170/75 (!) 149/83  (!) 177/79  Pulse: 93 90  99  Resp: 16   18  Temp: 98.3 F (36.8 C) 99 F (37.2 C)  99.3 F (37.4 C)  TempSrc: Oral Oral  Oral  SpO2: 92% 93%  97%  Weight:   134.4 kg     Intake/Output Summary (Last 24 hours) at 11/23/2022 1100  Last data filed at 11/23/2022 0800 Gross per 24 hour  Intake --  Output 400 ml  Net -400 ml       11/23/2022    5:00 AM 11/22/2022    4:27 AM 11/21/2022    5:00 AM  Last 3 Weights  Weight (lbs) 296 lb 4.8 oz 298 lb 4.5 oz 298 lb 11.6 oz  Weight (kg) 134.4 kg 135.3 kg 135.5 kg    Body mass index is 40.19 kg/m.  General: Well nourished, well developed, in no acute distress Head: Atraumatic, normal size  Eyes: PEERLA, EOMI  Neck: Supple, no JVD Endocrine: No thryomegaly Cardiac: Normal S1, S2; RRR; no murmurs, rubs, or gallops Lungs: Clear to auscultation bilaterally, no wheezing, rhonchi or rales  Abd: Soft, nontender, no hepatomegaly  Ext: 2+ pitting edema Musculoskeletal: No deformities, BUE and BLE strength normal and equal Skin: Warm and dry, no rashes   Neuro: Alert and oriented to person, place, time, and situation, CNII-XII grossly intact, no focal deficits  Psych: Normal mood and affect   Labs  High Sensitivity Troponin:   Recent Labs  Lab 11/12/22 1809 11/12/22 2122  TROPONINIHS 762* 593*      Cardiac EnzymesNo results for input(s): "TROPONINI" in the last 168 hours. No results for input(s): "TROPIPOC" in the last 168 hours.  Chemistry Recent Labs  Lab 11/20/22 1519 11/21/22 0443 11/22/22 0400  NA 131* 131* 132*  K 4.5 4.2 4.6  CL 102 99 100  CO2 23 25 28   GLUCOSE 129* 132* 110*  BUN 16 23* 25*  CREATININE 1.33* 1.55* 1.45*  CALCIUM 7.9* 8.4* 8.5*  GFRNONAA >60 54* 58*  ANIONGAP 6 7 4*    Hematology Recent Labs  Lab 11/20/22 1519 11/21/22 0443 11/22/22 0400  WBC 11.5* 13.0* 9.1  RBC 4.02* 3.75* 3.31*  HGB 10.5* 9.6* 8.7*  HCT 33.5* 31.3* 27.4*  MCV 83.3 83.5 82.8  MCH 26.1 25.6* 26.3  MCHC 31.3 30.7 31.8  RDW 14.0 14.1 14.1  PLT 166 158 144*   BNPNo results for input(s): "BNP", "PROBNP" in the last 168 hours.  DDimer No results for input(s): "DDIMER" in the last 168 hours.   Radiology  No results found.  Cardiac Studies  TTE 11/13/2022  1. Left ventricular ejection fraction, by estimation, is 45 to 50%. The  left ventricle has mildly decreased function. The left ventricle  demonstrates regional wall motion abnormalities (see scoring  diagram/findings for description). There is moderate  left ventricular hypertrophy. Left ventricular diastolic parameters are  consistent with Grade II diastolic dysfunction (pseudonormalization).  Elevated left atrial pressure. There is severe hypokinesis of the left  ventricular, basal-mid inferior wall and  inferolateral wall.   2. Right ventricular systolic function is normal. The right ventricular  size is normal. Tricuspid regurgitation signal is inadequate for assessing  PA pressure.   3. The mitral valve is degenerative. Trivial mitral valve regurgitation.  No evidence of mitral stenosis.   4. The aortic valve was not well visualized. Aortic valve regurgitation  is not visualized. No aortic stenosis is present.   Patient Profile  ARVIL UTZ is a 52 y.o. male with hypertension, obesity, prediabetes  admitted to [facility] on 11/12/2022 for non-STEMI.  Found to have three-vessel CAD.  Status post CABG on 11/19/2022.  Course complicated by acute systolic heart failure with a EF 45-50%  Assessment & Plan   # Non-STEMI # Status post CABG -Postop management per surgery. -Continue aspirin Plavix and Lipitor 80. -I am  transitioning to carvedilol for better blood pressure control. -continue norvasc for arterial grafts per CTS.   # Acute systolic heart failure, EF 45-50% -Volume up.  Diuresis per CT surgery.  May benefit from IV diuresis but I will defer this to surgery team given recent bypass. -Stop and metoprolol.  Add carvedilol 12.5 mg twice daily.  Add Entresto 24-26 mg twice daily.  Add Aldactone and SGLT2 inhibitor tomorrow. Continue norvasc given arterial grafts.   # Postop anemia -Expected.  Seems to be stable.  # AKI -Improving.  Okay for [employer].  Follow-up BMP.      For questions or updates, please contact [facility] Please consult [email or web address] for contact info under        Signed, Gerri Spore T. Flora Lipps, MD, [facility]  [facility]  11/23/2022 11:00 AM

## 2022-11-23 NOTE — Progress Notes (Addendum)
[address]       [place] [address]             [phone]      4 Days Post-Op Procedure(s) (LRB): CORONARY ARTERY BYPASS GRAFTING (CABG) X THREE  USING LEFT INTERNAL MAMMARY ARTERY, LEFT RADIAL ARTERY, AND ENDOSCOPICALLY HARVESTED RIGHT GREATER SAPHENOUS VEIN (N/A) TRANSESOPHAGEAL ECHOCARDIOGRAM (N/A) LEFT RADIAL ARTERY HARVEST (Left) Subjective: Patient states he is doing ok, he is having chest soreness and abdominal pain that improved with a BM  Objective: Vital signs in last 24 hours: Temp:  [98.3 F (36.8 C)-99 F (37.2 C)] 99 F (37.2 C) (09/27 0009) Pulse Rate:  [89-99] 90 (09/27 0009) Cardiac Rhythm: Sinus tachycardia (09/26 1926) Resp:  [15-19] 16 (09/26 2053) BP: (127-170)/(70-83) 149/83 (09/27 0009) SpO2:  [92 %-95 %] 93 % (09/27 0009) Weight:  [134.4 kg] 134.4 kg (09/27 0500)  Hemodynamic parameters for last 24 hours:    Intake/Output from previous day: 09/26 0701 - 09/27 0700 In: 250 [P.O.:250] Out: -  Intake/Output this shift: No intake/output data recorded.  General appearance: alert, cooperative, and no distress Neurologic: intact Heart: sinus tachycardia, no murmur Lungs: clear to auscultation bilaterally Abdomen: protuberant abdomen, not tender to palpation, active bowel sounds Extremities: edema 1+ Wound: Clean and dry without signs of infection  Lab Results: Recent Labs    11/21/22 0443 11/22/22 0400  WBC 13.0* 9.1  HGB 9.6* 8.7*  HCT 31.3* 27.4*  PLT 158 144*   BMET:  Recent Labs    11/21/22 0443 11/22/22 0400  NA 131* 132*  K 4.2 4.6  CL 99 100  CO2 25 28  GLUCOSE 132* 110*  BUN 23* 25*  CREATININE 1.55* 1.45*  CALCIUM 8.4* 8.5*    PT/INR: No results for input(s): "LABPROT", "INR" in the last 72 hours. ABG    Component Value Date/Time   PHART 7.336 (L) 11/19/2022 1847   HCO3 23.0 11/19/2022 1847   TCO2 24 11/19/2022 1847   ACIDBASEDEF 2.0 11/19/2022 1847   O2SAT 95 11/19/2022 1847   CBG  (last 3)  Recent Labs    11/22/22 0015 11/22/22 0429 11/22/22 0728  GLUCAP 106* 94 99    Assessment/Plan: S/P Procedure(s) (LRB): CORONARY ARTERY BYPASS GRAFTING (CABG) X THREE  USING LEFT INTERNAL MAMMARY ARTERY, LEFT RADIAL ARTERY, AND ENDOSCOPICALLY HARVESTED RIGHT GREATER SAPHENOUS VEIN (N/A) TRANSESOPHAGEAL ECHOCARDIOGRAM (N/A) LEFT RADIAL ARTERY HARVEST (Left)  CV: On Norvasc 5mg  daily and Lopressor 25mg  BID. BP 150-170s today after Lopressor titration yesterday. NSR-ST, HR 118 this AM. Will continue to titrate Lopressor to 50mg  BID. Hx of NSTEMI, on Plavix.    Pulm: Saturating well on RA. CXR with bibasilar atelectasis. Encourage IS and ambulation.    GI: +BM, good appetite   Endo: Preop A1C 6.2. Pt to follow up with medicine doctor as an outpatient.    Renal: AKI. Cr 1.45, trending down. BMET ordered and pending. +5lbs, continue PO Lasix 40mg  daily if creatinine is stable.    Mild Hyponatremia: Na 132, will monitor   ID: Reactive leukocytosis resolved, Tmax 99, mostly 98s.   Expected postop ABLA: H/H 8.7/27.4 trending down, not clinically significant at this point   Postoperative thrombocytopenia: Plt 144,000, will monitor.    DVT Prophylaxis: Lovenox   Dispo: Work on HR and BP control, waiting on bmet. Hopefully can d/c home tomorrow   LOS: 9 days    Jenny Reichmann, PA-C 11/23/2022  Agree with above Titrating meds Checking renal  function Dispo planning  Danylah Holden O Rayonna Heldman

## 2022-11-23 NOTE — Progress Notes (Signed)
CARDIAC REHAB PHASE I   PRE:  Rate/Rhythm: 97 SR    BP: sitting 139/74    SpO2: 96 RA  MODE:  Ambulation: 470 ft   POST:  Rate/Rhythm: 118 ST    BP: sitting 176/89     SpO2: 96 RA  Pt stood independently and walked initially with RW then without. Pt steady, quick pace. Tolerated well, just SOB due to exertion. BP elevated. Return to recliner.   Discussed with pt IS, sternal precautions, exercise, diet, and CRPII. Pt receptive. Wants to lose weight and be a healthier version of himself. Will refer to [facility] CRPII. Encouraged pt to talk with his family regarding care at d/c as his sister is now in the hospital. Discussed having rotating supervisors if that works best.  [id number]   Ethelda Chick BS, ACSM-CEP 11/23/2022 12:20 PM

## 2022-11-23 NOTE — TOC Progression Note (Signed)
Transition of Care [facility]) - Progression Note    Patient Details  Name: Alexander Simon MRN: [id number] Date of Birth: 31-Aug-1970  Transition of [facility]) CM/SW Contact  Lockie Pares, RN Phone Number: 11/23/2022, 2:29 PM  Clinical Narrative:    Referred for cardiac rehab phase II [facility]. Patient was to stay with his sister but now she is hospitalized Patient ambulating almost 500 feet but has some hypertension.  TOC will continue to follow for needs.   Expected Discharge Plan: Home w Home Health Services Barriers to Discharge: Continued Medical Work up  Expected Discharge Plan and Services In-house Referral: NA Discharge Planning Services: CM Consult   Living arrangements for the past 2 months: Single Family Home                   DME Agency: NA                   Social Determinants of Health (SDOH) Interventions SDOH Screenings   Food Insecurity: No Food Insecurity (11/22/2022)  Housing: Low Risk  (11/22/2022)  Transportation Needs: No Transportation Needs (11/22/2022)  Utilities: Not At Risk (11/22/2022)  Alcohol Screen: Low Risk  (05/02/2018)  Depression (PHQ2-9): Low Risk  (05/04/2019)  Tobacco Use: Low Risk  (11/19/2022)    Readmission Risk Interventions     No data to display

## 2022-11-24 LAB — CBC
HCT: 33 % — ABNORMAL LOW (ref 39.0–52.0)
Hemoglobin: 10.5 g/dL — ABNORMAL LOW (ref 13.0–17.0)
MCH: 26 pg (ref 26.0–34.0)
MCHC: 31.8 g/dL (ref 30.0–36.0)
MCV: 81.7 fL (ref 80.0–100.0)
Platelets: 214 10*3/uL (ref 150–400)
RBC: 4.04 MIL/uL — ABNORMAL LOW (ref 4.22–5.81)
RDW: 14.3 % (ref 11.5–15.5)
WBC: 7.1 10*3/uL (ref 4.0–10.5)
nRBC: 0.8 % — ABNORMAL HIGH (ref 0.0–0.2)

## 2022-11-24 LAB — BASIC METABOLIC PANEL
Anion gap: 11 (ref 5–15)
BUN: 18 mg/dL (ref 6–20)
CO2: 21 mmol/L — ABNORMAL LOW (ref 22–32)
Calcium: 8.8 mg/dL — ABNORMAL LOW (ref 8.9–10.3)
Chloride: 106 mmol/L (ref 98–111)
Creatinine, Ser: 1.03 mg/dL (ref 0.61–1.24)
GFR, Estimated: >60 mL/min (ref >60)
Glucose, Bld: 114 mg/dL — ABNORMAL HIGH (ref 70–99)
Potassium: 5.2 mmol/L — ABNORMAL HIGH (ref 3.5–5.1)
Sodium: 138 mmol/L (ref 135–145)

## 2022-11-24 MED ORDER — EMPAGLIFLOZIN 10 MG PO TABS
10.0000 mg | ORAL_TABLET | Freq: Every day | ORAL | Status: DC
  Administered 2022-11-24 – 2022-11-25 (×2): 10 mg via ORAL
  Filled 2022-11-24 (×2): qty 1

## 2022-11-24 MED ORDER — SPIRONOLACTONE 12.5 MG HALF TABLET
12.5000 mg | ORAL_TABLET | Freq: Every day | ORAL | Status: DC
  Administered 2022-11-24 – 2022-11-25 (×2): 12.5 mg via ORAL
  Filled 2022-11-24 (×2): qty 1

## 2022-11-24 NOTE — Progress Notes (Signed)
CARDIAC REHAB PHASE I   PRE:  Rate/Rhythm: 88NSR  BP:  Seated: 106/63     SaO2: 96%RA  Pt in recliner with legs elevated. Endorses increased swell and tightness in the feel bilaterally. Reviewed education given to Pt yesterday. He has good understanding and retention. He knows to check daily weights for increased swelling and modify salt consumption. Pt left in the recliner with call bell in reach. Pt questions were answered.   Harrie Jeans ACSM-CEP 11/24/2022 10:45 AM

## 2022-11-24 NOTE — Progress Notes (Signed)
Lab attempted to get lab draws x 4 sticks with 2 different phlebotomist, pt refusing any more sticks at this time . Day shift phlebotomist  will attempt after & am

## 2022-11-24 NOTE — TOC Progression Note (Signed)
Transition of Care [facility]) - Progression Note    Patient Details  Name: Alexander Simon MRN: [id number] Date of Birth: [birth year]  Transition of Care [facility]) CM/SW Contact  Ronny Bacon, RN Phone Number: 11/24/2022, 3:12 PM  Clinical Narrative:  Patient has order for rolling walker. [employer] to supply rolling walker after benefit check and will notify if patient is not eligible for one through that company.     Expected Discharge Plan: Home w Home Health Services Barriers to Discharge: Continued Medical Work up  Expected Discharge Plan and Services In-house Referral: NA Discharge Planning Services: CM Consult   Living arrangements for the past 2 months: Single Family Home                 DME Arranged: Walker rolling DME Agency: [employer] Date DME Agency Contacted: 11/24/22 Time DME Agency Contacted: [phone] Representative spoke with at DME Agency: [facility]             Social Determinants of Health (SDOH) Interventions SDOH Screenings   Food Insecurity: No Food Insecurity (11/22/2022)  Housing: Low Risk  (11/22/2022)  Transportation Needs: No Transportation Needs (11/22/2022)  Utilities: Not At Risk (11/22/2022)  Alcohol Screen: Low Risk  (05/02/2018)  Depression (PHQ2-9): Low Risk  (05/04/2019)  Tobacco Use: Low Risk  (11/19/2022)    Readmission Risk Interventions     No data to display

## 2022-11-24 NOTE — Progress Notes (Signed)
Mobility Specialist Progress Note:   11/24/22 1527  Mobility  Activity Ambulated with assistance in hallway  Level of Assistance Standby assist, set-up cues, supervision of patient - no hands on  Assistive Device None  RUE Weight Bearing NWB  LUE Weight Bearing NWB  Mobility Referral Yes  $Mobility charge 1 Mobility  Mobility Specialist Start Time (ACUTE ONLY) 1440  Mobility Specialist Stop Time (ACUTE ONLY) 1450  Mobility Specialist Time Calculation (min) (ACUTE ONLY) 10 min   Pre Mobility: 90 HR  During Mobility: 107 HR  Post Mobility: 92 HR   Pt received in chair, agreeable to mobility. C/o nausea prior to standing otherwise asymptomatic throughout. Pt returned to chair with call bell in reach and all needs met.  Leory Plowman  Mobility Specialist Please contact via [employer] office at [phone]

## 2022-11-24 NOTE — Progress Notes (Addendum)
5 Days Post-Op Procedure(s) (LRB): CORONARY ARTERY BYPASS GRAFTING (CABG) X THREE  USING LEFT INTERNAL MAMMARY ARTERY, LEFT RADIAL ARTERY, AND ENDOSCOPICALLY HARVESTED RIGHT GREATER SAPHENOUS VEIN (N/A) TRANSESOPHAGEAL ECHOCARDIOGRAM (N/A) LEFT RADIAL ARTERY HARVEST (Left) Subjective: Feels well and looks well  Objective: Vital signs in last 24 hours: Temp:  [97.6 F (36.4 C)-99 F (37.2 C)] 98.5 F (36.9 C) (09/28 0351) Pulse Rate:  [86-100] 86 (09/28 0351) Cardiac Rhythm: Normal sinus rhythm (09/27 2009) Resp:  [16-18] 18 (09/28 0351) BP: (125-165)/(62-82) 151/80 (09/28 0351) SpO2:  [95 %-97 %] 95 % (09/28 0351) Weight:  [130 kg] 130 kg (09/28 0351)  Hemodynamic parameters for last 24 hours:    Intake/Output from previous day: 09/27 0701 - 09/28 0700 In: 480 [P.O.:480] Out: 400 [Urine:400] Intake/Output this shift: No intake/output data recorded.  Physical Exam Constitutional:      Appearance: He is not ill-appearing.  Cardiovascular:     Rate and Rhythm: Normal rate and regular rhythm.     Heart sounds:     No friction rub.  Pulmonary:     Effort: Pulmonary effort is normal.     Comments: Dim right>left base, o/w clear Abdominal:     Comments: benign  Musculoskeletal:     Right lower leg: Edema present.     Left lower leg: Edema present.  Neurological:     Mental Status: He is alert.      Lab Results: Recent Labs    11/22/22 0400  WBC 9.1  HGB 8.7*  HCT 27.4*  PLT 144*   BMET:  Recent Labs    11/22/22 0400 11/23/22 1101  NA 132* 138  K 4.6 5.6*  CL 100 103  CO2 28 22  GLUCOSE 110* 126*  BUN 25* 22*  CREATININE 1.45* 1.15  CALCIUM 8.5* 9.0    PT/INR: No results for input(s): "LABPROT", "INR" in the last 72 hours. ABG    Component Value Date/Time   PHART 7.336 (L) 11/19/2022 1847   HCO3 23.0 11/19/2022 1847   TCO2 24 11/19/2022 1847   ACIDBASEDEF 2.0 11/19/2022 1847   O2SAT 95 11/19/2022 1847   CBG (last 3)  Recent Labs     11/22/22 0015 11/22/22 0429 11/22/22 0728  GLUCAP 106* 94 99    Meds Scheduled Meds:  acetaminophen  1,000 mg Oral Q6H   Or   acetaminophen (TYLENOL) oral liquid 160 mg/5 mL  1,000 mg Per Tube Q6H   amLODipine  5 mg Oral Daily   aspirin EC  81 mg Oral Daily   atorvastatin  80 mg Oral Daily   bisacodyl  10 mg Oral Daily   Or   bisacodyl  10 mg Rectal Daily   carvedilol  12.5 mg Oral BID WC   Chlorhexidine Gluconate Cloth  6 each Topical Daily   clopidogrel  75 mg Oral Daily   docusate sodium  200 mg Oral Daily   enoxaparin (LOVENOX) injection  40 mg Subcutaneous QHS   furosemide  40 mg Oral Daily   lidocaine  1 patch Transdermal Daily   methocarbamol  500 mg Oral TID   pantoprazole  40 mg Oral Daily   sacubitril-valsartan  1 tablet Oral BID   sodium chloride flush  3 mL Intravenous Q12H   Continuous Infusions:  sodium chloride Stopped (11/20/22 1806)   sodium chloride     albumin human 999 mL/hr at 11/20/22 1000   lactated ringers     lactated ringers Stopped (11/21/22 0621)   PRN Meds:.sodium  chloride, sodium chloride, albumin human, diclofenac Sodium, metoprolol tartrate, midazolam, ondansetron (ZOFRAN) IV, mouth rinse, oxyCODONE, sodium chloride flush, traMADol  Xrays No results found.  Assessment/Plan: S/P Procedure(s) (LRB): CORONARY ARTERY BYPASS GRAFTING (CABG) X THREE  USING LEFT INTERNAL MAMMARY ARTERY, LEFT RADIAL ARTERY, AND ENDOSCOPICALLY HARVESTED RIGHT GREATER SAPHENOUS VEIN (N/A) TRANSESOPHAGEAL ECHOCARDIOGRAM (N/A) LEFT RADIAL ARTERY HARVEST (Left)  1 afeb, Tmax 99 s BP 120's-160's 2 O2 sats good on RA 3 voiding- not all measured, + BM 4 BS well controlled 5 labs pending- phlebotomy having trouble with stick, will f/u as just drawn 6 titrating meds- per cards add aldactone and SGLT2 today- will order 7 cont pulm hygiene , diuresis and rehab modalities 8 prob home in am if no new issues    LOS: 10 days    Rowe Clack PA-C Pager [phone]  [phone] 11/24/2022 Patient seen and examined, agree with findings and plan outlined above  Viviann Spare C. Dorris Fetch, MD [facility] [phone]

## 2022-11-24 NOTE — Plan of Care (Signed)
Mr. Kobler is tolerating full GDMT. No changes from a cardiology perspective today.

## 2022-11-25 LAB — BASIC METABOLIC PANEL
Anion gap: 16 — ABNORMAL HIGH (ref 5–15)
BUN: 21 mg/dL — ABNORMAL HIGH (ref 6–20)
CO2: 17 mmol/L — ABNORMAL LOW (ref 22–32)
Calcium: 9.3 mg/dL (ref 8.9–10.3)
Chloride: 103 mmol/L (ref 98–111)
Creatinine, Ser: 1.25 mg/dL — ABNORMAL HIGH (ref 0.61–1.24)
GFR, Estimated: >60 mL/min (ref >60)
Glucose, Bld: 105 mg/dL — ABNORMAL HIGH (ref 70–99)
Potassium: 5.7 mmol/L — ABNORMAL HIGH (ref 3.5–5.1)
Sodium: 136 mmol/L (ref 135–145)

## 2022-11-25 MED ORDER — OXYCODONE HCL 5 MG PO TABS: 5.0000 mg | ORAL_TABLET | Freq: Four times a day (QID) | ORAL | 0 refills | Status: DC | PRN

## 2022-11-25 MED ORDER — AMLODIPINE BESYLATE 5 MG PO TABS: 5.0000 mg | ORAL_TABLET | Freq: Every day | ORAL | 0 refills | Status: DC

## 2022-11-25 MED ORDER — SACUBITRIL-VALSARTAN 24-26 MG PO TABS: 1.0000 | ORAL_TABLET | Freq: Two times a day (BID) | ORAL | 1 refills | Status: DC

## 2022-11-25 MED ORDER — CARVEDILOL 12.5 MG PO TABS: 12.5000 mg | ORAL_TABLET | Freq: Two times a day (BID) | ORAL | 1 refills | Status: DC

## 2022-11-25 MED ORDER — EMPAGLIFLOZIN 10 MG PO TABS: 10.0000 mg | ORAL_TABLET | Freq: Every day | ORAL | 1 refills | Status: DC

## 2022-11-25 MED ORDER — CLOPIDOGREL BISULFATE 75 MG PO TABS: 75.0000 mg | ORAL_TABLET | Freq: Every day | ORAL | 1 refills | Status: DC

## 2022-11-25 NOTE — Progress Notes (Signed)
Patient given discharge instructions. PIV removed. Telemetry box removed, CCMD notified. Patient taken to vehicle in wheelchair by staff.  Chidera Dearcos L Anzal Bartnick, RN  

## 2022-11-25 NOTE — TOC Transition Note (Signed)
Transition of Care [facility]) - CM/SW Discharge Note   Patient Details  Name: Alexander Simon MRN: [id number] Date of Birth: 02/22/1971  Transition of Care [facility]) CM/SW Contact:  Ronny Bacon, RN Phone Number: 11/25/2022, 8:18 AM   Clinical Narrative:  Patient is being discharged today. Spoke with patient by phone, confirmed that he has RW at bedside. Patient will call for ride when he is discharged.     Final next level of care: Home/Self Care Barriers to Discharge: No Barriers Identified   Patient Goals and CMS Choice      Discharge Placement                         Discharge Plan and Services Additional resources added to the After Visit Summary for   In-house Referral: NA Discharge Planning Services: CM Consult            DME Arranged: Walker rolling DME Agency: [employer] Date DME Agency Contacted: 11/24/22 Time DME Agency Contacted: [phone] Representative spoke with at DME Agency: [facility]            Social Determinants of Health (SDOH) Interventions SDOH Screenings   Food Insecurity: No Food Insecurity (11/22/2022)  Housing: Low Risk  (11/22/2022)  Transportation Needs: No Transportation Needs (11/22/2022)  Utilities: Not At Risk (11/22/2022)  Alcohol Screen: Low Risk  (05/02/2018)  Depression (PHQ2-9): Low Risk  (05/04/2019)  Tobacco Use: Low Risk  (11/19/2022)     Readmission Risk Interventions     No data to display

## 2022-11-25 NOTE — Progress Notes (Addendum)
6 Days Post-Op Procedure(s) (LRB): CORONARY ARTERY BYPASS GRAFTING (CABG) X THREE  USING LEFT INTERNAL MAMMARY ARTERY, LEFT RADIAL ARTERY, AND ENDOSCOPICALLY HARVESTED RIGHT GREATER SAPHENOUS VEIN (N/A) TRANSESOPHAGEAL ECHOCARDIOGRAM (N/A) LEFT RADIAL ARTERY HARVEST (Left) Subjective: Feels well, wants to go home  Objective: Vital signs in last 24 hours: Temp:  [98 F (36.7 C)-99.5 F (37.5 C)] 98.7 F (37.1 C) (09/29 0725) Pulse Rate:  [78-119] 82 (09/29 0725) Cardiac Rhythm: Normal sinus rhythm (09/28 2033) Resp:  [16-18] 17 (09/29 0725) BP: (100-148)/(56-81) 126/81 (09/29 0725) SpO2:  [94 %-96 %] 95 % (09/29 0725) Weight:  [126.7 kg] 126.7 kg (09/29 0332)  Hemodynamic parameters for last 24 hours:    Intake/Output from previous day: No intake/output data recorded. Intake/Output this shift: No intake/output data recorded.  General appearance: alert, cooperative, and no distress Heart: regular rate and rhythm Lungs: mildly dim in bases Abdomen: benign Extremities: trace LE edema Wound: incis healing well  Lab Results: Recent Labs    11/24/22 0743  WBC 7.1  HGB 10.5*  HCT 33.0*  PLT 214   BMET:  Recent Labs    11/24/22 0743 11/25/22 0316  NA 138 136  K 5.2* 5.7*  CL 106 103  CO2 21* 17*  GLUCOSE 114* 105*  BUN 18 21*  CREATININE 1.03 1.25*  CALCIUM 8.8* 9.3    PT/INR: No results for input(s): "LABPROT", "INR" in the last 72 hours. ABG    Component Value Date/Time   PHART 7.336 (L) 11/19/2022 1847   HCO3 23.0 11/19/2022 1847   TCO2 24 11/19/2022 1847   ACIDBASEDEF 2.0 11/19/2022 1847   O2SAT 95 11/19/2022 1847   CBG (last 3)  No results for input(s): "GLUCAP" in the last 72 hours.  Meds Scheduled Meds:  amLODipine  5 mg Oral Daily   aspirin EC  81 mg Oral Daily   atorvastatin  80 mg Oral Daily   bisacodyl  10 mg Oral Daily   Or   bisacodyl  10 mg Rectal Daily   carvedilol  12.5 mg Oral BID WC   Chlorhexidine Gluconate Cloth  6 each  Topical Daily   clopidogrel  75 mg Oral Daily   docusate sodium  200 mg Oral Daily   empagliflozin  10 mg Oral Daily   enoxaparin (LOVENOX) injection  40 mg Subcutaneous QHS   furosemide  40 mg Oral Daily   lidocaine  1 patch Transdermal Daily   methocarbamol  500 mg Oral TID   pantoprazole  40 mg Oral Daily   sacubitril-valsartan  1 tablet Oral BID   sodium chloride flush  3 mL Intravenous Q12H   spironolactone  12.5 mg Oral Daily   Continuous Infusions:  sodium chloride Stopped (11/20/22 1806)   sodium chloride     albumin human 999 mL/hr at 11/20/22 1000   lactated ringers     lactated ringers Stopped (11/21/22 0621)   PRN Meds:.sodium chloride, sodium chloride, albumin human, diclofenac Sodium, metoprolol tartrate, midazolam, ondansetron (ZOFRAN) IV, mouth rinse, oxyCODONE, sodium chloride flush, traMADol  Xrays No results found.  Assessment/Plan: S/P Procedure(s) (LRB): CORONARY ARTERY BYPASS GRAFTING (CABG) X THREE  USING LEFT INTERNAL MAMMARY ARTERY, LEFT RADIAL ARTERY, AND ENDOSCOPICALLY HARVESTED RIGHT GREATER SAPHENOUS VEIN (N/A) TRANSESOPHAGEAL ECHOCARDIOGRAM (N/A) LEFT RADIAL ARTERY HARVEST (Left)  1 afeb, VSS s BP 100's-140's, mostly in good range , sinus rhythm, on plavix/lipitor, coreg, norvasc, entresto, aldactone, lasix 2 O2 sats good on RA 3 voiding - not measured  4 K+ 5.7- not on  potassium, on GDMT, creat bumped a little to 1.25, improved from peak of 1.55- will stop lasix as appears well below preop weight, 126 kg today, will d/c spiro while on entresto since K+ has been somewhat elevated 5 stable for d/c    LOS: 11 days    Rowe Clack PA-C Pager [phone] 11/25/2022  Patient seen and examined, agree with above Home today  Salvatore Decent. Dorris Fetch, MD [facility] [phone]

## 2022-11-27 ENCOUNTER — Telehealth: Payer: Self-pay | Admitting: *Deleted

## 2022-11-27 MED ORDER — SACUBITRIL-VALSARTAN 24-26 MG PO TABS: 1.0000 | ORAL_TABLET | Freq: Two times a day (BID) | ORAL | 0 refills | Status: DC

## 2022-11-27 MED ORDER — ENTRESTO 24-26 MG PO TABS: 1.0000 | ORAL_TABLET | Freq: Two times a day (BID) | ORAL | 3 refills | Status: DC

## 2022-11-27 NOTE — Telephone Encounter (Signed)
Spoke with patient and reviewed request for assistance. Advised that we need the first 2 pages of his 1040 and then sign application for me to submit to company. He verbalized understanding with no further questions at this time.   Instructed him to ask for Rinaldo Cloud when he comes into the office.

## 2022-11-28 MED FILL — Electrolyte-R (PH 7.4) Solution: INTRAVENOUS | Qty: 3000 | Status: AC

## 2022-11-28 MED FILL — Heparin Sodium (Porcine) Inj 1000 Unit/ML: INTRAMUSCULAR | Qty: 30 | Status: AC

## 2022-11-28 MED FILL — Sodium Bicarbonate IV Soln 8.4%: INTRAVENOUS | Qty: 50 | Status: AC

## 2022-11-28 MED FILL — Calcium Chloride Inj 10%: INTRAVENOUS | Qty: 10 | Status: AC

## 2022-11-28 MED FILL — Mannitol IV Soln 20%: INTRAVENOUS | Qty: 500 | Status: AC

## 2022-11-30 ENCOUNTER — Ambulatory Visit (INDEPENDENT_AMBULATORY_CARE_PROVIDER_SITE_OTHER): Payer: Self-pay | Admitting: Thoracic Surgery (Cardiothoracic Vascular Surgery)

## 2022-11-30 DIAGNOSIS — Z951 Presence of aortocoronary bypass graft: Secondary | ICD-10-CM

## 2022-11-30 NOTE — Progress Notes (Signed)
     [address]       [place] [address]             [phone]       Patient: Home Provider: Office Consent for Telemedicine visit obtained.  Today's visit was completed via a real-time telehealth (see specific modality noted below). The patient/authorized person provided oral consent at the time of the visit to engage in a telemedicine encounter with the present provider at [facility]. The patient/authorized person was informed of the potential benefits, limitations, and risks of telemedicine. The patient/authorized person expressed understanding that the laws that protect confidentiality also apply to telemedicine. The patient/authorized person acknowledged understanding that telemedicine does not provide emergency services and that he or she would need to call 911 or proceed to the nearest hospital for help if such a need arose.   Total time spent in the clinical discussion 10 minutes.  Telehealth Modality: Phone visit (audio only)  I had a telephone visit with  Alexander Simon who is s/p CABG.  Overall doing well.  Pain is minimal.  Ambulating well. Vitals have been stable.  Alexander Simon will see [place] back in 1 month with a chest x-ray for cardiac rehab clearance.  [name]

## 2022-12-10 ENCOUNTER — Telehealth: Payer: Self-pay | Admitting: *Deleted

## 2022-12-10 NOTE — Telephone Encounter (Signed)
STD paperwork faxed to [facility] at [phone] with an estimated RTW date of 02/11/23. Original forms mailed to patient's home address.

## 2022-12-11 ENCOUNTER — Telehealth: Payer: Self-pay | Admitting: Internal Medicine

## 2022-12-11 NOTE — Telephone Encounter (Signed)
Received a request from [facility] requesting office notes and information regarding patient's FMLA and time off from work.  Returned fax to [employer] fax# [phone] with note that the request needs to go to Dr. Cliffton Asters at [facility].  Patient was seen at that practice on 11/30/22

## 2022-12-12 NOTE — Progress Notes (Signed)
Cardiology Office Note:    Date:  12/14/2022   ID:  Alexander Simon, DOB 05-Dec-1970, MRN [id number]  PCP:  Oneita Hurt, No  [facility] Cardiologist:  Yvonne Kendall, MD  [facility] Electrophysiologist:  None   Referring MD: No ref. provider found   Chief Complaint: Hospital follow-up  History of Present Illness:    Alexander Simon is a 52 y.o. male with a hx of Hypertension, obesity, CAD s/p CABG x 3, HFmrEF, ICM, prediabetes who is being seen for hospital follow-up.   The patient presented to [facility] 11/12/22 with chest pain. HS troponin elevated to 700s. Echo showed LVEF 45-50%, moderate LVH, G2DD. Cardiac cath showed severe 3V CAD and he was transferred to [facility] for CABG consult. The patient underwent CABG x3 using LIMA to LAD, Radial artery to OM and SVG to diagonal on 9/23. Post-op patient required some IV diuresis. He was started on Plavix for NSTEMI. He was transitioned to [place], [place], and [place]. [name] was held for elevated K.   Today, the patient is overall doing well. He will see CTS next month. He has been walking daily. Surgical sites look good. He has some chest soreness from the surgery, no exertional symptoms. No lower leg edema. He has MSK pain at night and can't lay flight. He needs ASA, Entresto and Lipitor.   Past Medical History:  Diagnosis Date   GERD (gastroesophageal reflux disease)    no meds   Hypertension    No pertinent past medical history     Past Surgical History:  Procedure Laterality Date   CIRCUMCISION     CORONARY ARTERY BYPASS GRAFT N/A 11/19/2022   Procedure: CORONARY ARTERY BYPASS GRAFTING (CABG) X THREE  USING LEFT INTERNAL MAMMARY ARTERY, LEFT RADIAL ARTERY, AND ENDOSCOPICALLY HARVESTED RIGHT GREATER SAPHENOUS VEIN;  Surgeon: Corliss Skains, MD;  Location: [facility] OR;  Service: Open Heart Surgery;  Laterality: N/A;   EXCISION MASS NECK Right 06/12/2019   Procedure: EXCISION Superficial MASS NECK;  Surgeon: Duanne Guess, MD;  Location: [facility]  ORS;  Service: General;  Laterality: Right;   EXCISION OF SKIN TAG Bilateral 06/12/2019   Procedure: EXCISION OF SKIN TAG on neck;  Surgeon: Duanne Guess, MD;  Location: [facility] ORS;  Service: General;  Laterality: Bilateral;   LEFT HEART CATH AND CORONARY ANGIOGRAPHY N/A 11/13/2022   Procedure: LEFT HEART CATH AND CORONARY ANGIOGRAPHY;  Surgeon: Yvonne Kendall, MD;  Location: [facility] INVASIVE CV LAB;  Service: Cardiovascular;  Laterality: N/A;   RADIAL ARTERY HARVEST Left 11/19/2022   Procedure: LEFT RADIAL ARTERY HARVEST;  Surgeon: Corliss Skains, MD;  Location: [facility] OR;  Service: Open Heart Surgery;  Laterality: Left;   SHOULDER SURGERY Right 2014   TEE WITHOUT CARDIOVERSION N/A 11/19/2022   Procedure: TRANSESOPHAGEAL ECHOCARDIOGRAM;  Surgeon: Corliss Skains, MD;  Location: [facility] OR;  Service: Open Heart Surgery;  Laterality: N/A;    Current Medications: Current Meds  Medication Sig   amLODipine (NORVASC) 5 MG tablet Take 1 tablet (5 mg total) by mouth daily.   carvedilol (COREG) 12.5 MG tablet Take 1 tablet (12.5 mg total) by mouth 2 (two) times daily with a meal.   clopidogrel (PLAVIX) 75 MG tablet Take 1 tablet (75 mg total) by mouth daily.   empagliflozin (JARDIANCE) 10 MG TABS tablet Take 1 tablet (10 mg total) by mouth daily.   nitroGLYCERIN (NITROSTAT) 0.4 MG SL tablet Place 1 tablet (0.4 mg total) under the tongue every 5 (five) minutes as needed for chest pain.  oxyCODONE (OXY IR/ROXICODONE) 5 MG immediate release tablet Take 1 tablet (5 mg total) by mouth every 6 (six) hours as needed for severe pain.   [DISCONTINUED] aspirin EC 81 MG tablet Take 1 tablet (81 mg total) by mouth daily. Swallow whole. (Patient taking differently: Take 162 mg by mouth daily. Swallow whole.)     Allergies:   Patient has no known allergies.   Social History   Socioeconomic History   Marital status: Single    Spouse name: Not on file   Number of children: Not on file   Years of education:  Not on file   Highest education level: Not on file  Occupational History   Not on file  Tobacco Use   Smoking status: Never   Smokeless tobacco: Never  Vaping Use   Vaping status: Never Used  Substance and Sexual Activity   Alcohol use: No    Alcohol/week: 0.0 standard drinks of alcohol   Drug use: Never   Sexual activity: Yes  Other Topics Concern   Not on file  Social History Narrative   Not on file   Social Determinants of Health   Financial Resource Strain: Not on file  Food Insecurity: No Food Insecurity (11/22/2022)   Hunger Vital Sign    Worried About Running Out of Food in the Last Year: Never true    Ran Out of Food in the Last Year: Never true  Transportation Needs: No Transportation Needs (11/22/2022)   PRAPARE - Administrator, Civil Service (Medical): No    Lack of Transportation (Non-Medical): No  Physical Activity: Not on file  Stress: Not on file  Social Connections: Not on file     Family History: The patient's family history includes Breast cancer in his mother; Cancer in his maternal uncle; Lung cancer in his mother.  ROS:   Please see the history of present illness.     All other systems reviewed and are negative.  EKGs/Labs/Other Studies Reviewed:    The following studies were reviewed today:  Intraoperative TEE 10/2022 Complications: No known complications during this procedure.  POST-OP IMPRESSIONS  _ Left Ventricle: The left ventricle is unchanged from pre-bypass.  _ Right Ventricle: The right ventricle appears unchanged from pre-bypass.  _ Aorta: The aorta appears unchanged from pre-bypass.  _ Left Atrium: The left atrium appears unchanged from pre-bypass.  _ Left Atrial Appendage: The left atrial appendage appears unchanged from  pre-bypass.  _ Aortic Valve: The aortic valve appears unchanged from pre-bypass.  _ Mitral Valve: The mitral valve appears unchanged from pre-bypass.  _ Tricuspid Valve: The tricuspid valve appears  unchanged from pre-bypass.  _ Pulmonic Valve: The pulmonic valve appears unchanged from pre-bypass.  _ Interatrial Septum: The interatrial septum appears unchanged from  pre-bypass.  _ Interventricular Septum: The interventricular septum appears unchanged  from  pre-bypass.  _ Pericardium: The pericardium appears unchanged from pre-bypass.   LHC 10/2022 Conclusions: Multivessel CAD, including 50% ostial LMCA stenosis, mild luminal irregularities involving the LAD, 90% ostial and mid/distal LCx lesions, and sequential 50-60% RCA stenoses. LVEDP 18 mmHg.   Recommendations: Transfer to [facility] for CABG evaluation.  Ostial LCx lesion would be very difficult to treat percutaneously due to angulation of the vessel relative to the LMCA and LAD.  Additionally, there is at least moderate, bordering on severe ostial LMCA and sequential mid/distal RCA disease. Resume heparin infusion 2 hours after TR band removal. Aggressive secondary prevention of CAD.   Yvonne Kendall, MD  [facility]  Coronary Diagrams  Diagnostic Dominance: Right     Echo 10/2022  1. Left ventricular ejection fraction, by estimation, is 45 to 50%. The  left ventricle has mildly decreased function. The left ventricle  demonstrates regional wall motion abnormalities (see scoring  diagram/findings for description). There is moderate  left ventricular hypertrophy. Left ventricular diastolic parameters are  consistent with Grade II diastolic dysfunction (pseudonormalization).  Elevated left atrial pressure. There is severe hypokinesis of the left  ventricular, basal-mid inferior wall and  inferolateral wall.   2. Right ventricular systolic function is normal. The right ventricular  size is normal. Tricuspid regurgitation signal is inadequate for assessing  PA pressure.   3. The mitral valve is degenerative. Trivial mitral valve regurgitation.  No evidence of mitral stenosis.   4. The aortic valve was not well  visualized. Aortic valve regurgitation  is not visualized. No aortic stenosis is present.    EKG:  EKG is ordered today.  The ekg ordered today demonstrates NSR 91bpm, LAD, nonspecific ST/T wave changes  Recent Labs: 11/12/2022: ALT 34; TSH 1.991 11/20/2022: Magnesium 2.6 11/24/2022: Hemoglobin 10.5; Platelets 214 11/25/2022: BUN 21; Creatinine, Ser 1.25; Potassium 5.7; Sodium 136  Recent Lipid Panel    Component Value Date/Time   CHOL 179 11/13/2022 0503   CHOL 208 (H) 05/05/2018 1047   TRIG 203 (H) 11/13/2022 0503   HDL 35 (L) 11/13/2022 0503   HDL 40 05/05/2018 1047   CHOLHDL 5.1 11/13/2022 0503   VLDL 41 (H) 11/13/2022 0503   LDLCALC 103 (H) 11/13/2022 0503   LDLCALC 129 (H) 05/05/2018 1047    Physical Exam:    VS:  BP 134/72 (BP Location: Left Arm, Patient Position: Sitting, Cuff Size: Large)   Pulse 91   Ht 6' (1.829 m)   Wt 289 lb 6.4 oz (131.3 kg)   SpO2 98%   BMI 39.25 kg/m     Wt Readings from Last 3 Encounters:  12/14/22 289 lb 6.4 oz (131.3 kg)  11/25/22 279 lb 6.4 oz (126.7 kg)  11/13/22 291 lb (132 kg)     GEN:  Well nourished, well developed in no acute distress HEENT: Normal NECK: No JVD; No carotid bruits LYMPHATICS: No lymphadenopathy CARDIAC: RRR, no murmurs, rubs, gallops RESPIRATORY:  Clear to auscultation without rales, wheezing or rhonchi  ABDOMEN: Soft, non-tender, non-distended MUSCULOSKELETAL:  No edema; No deformity  SKIN: Warm and dry NEUROLOGIC:  Alert and oriented x 3 PSYCHIATRIC:  Normal affect   ASSESSMENT:    1. Non-ST elevation (NSTEMI) myocardial infarction (HCC)   2. Coronary artery disease involving native coronary artery of native heart without angina pectoris   3. Ischemic cardiomyopathy   4. Chronic heart failure with mildly reduced ejection fraction (HFmrEF, 41-49%) (HCC)   5. Hyperkalemia   6. Hypertension, unspecified type    PLAN:    In order of problems listed above:  NSTEMI CAD s/p CABG Patient underwent  CABG x3. He has appointment with CTS next month. He is overall recovering well. He is walking daily with good progression. He has some chest pain soreness and improving DOE. He has not received Lipitor so we will send this in. Continue ASA 81mg  daily, Coreg, Plavix, and Lipitor. I will also send in SL NTG to use PRN chest pain.  HFmrEF ICM Echo showed LVEF 45-50%. Patient was started on Coreg, Entresto, Jardiance, and spiro in the hospital. [name] was stopped for hyperkalemia. The patient has not gotten his Sherryll Burger, so we will  send this in. The patient is euvolemic on exam. Continue Coreg 12.5mg  BID and Jardiance 10mg  daily.   HTN BP is good today, restart Entresto as above. Continue Amlodipine 5mg  daily, Coreg 12.5mg  BID, and Jardiance 10mg  daily.   Post-op anemia Re-check CBC  Hyperkalemia K 5.7 two weeks ago. [name] stopped in the hospital. I will re-check a BMET today.  Disposition: Follow up in 1 month(s) with MD/APP     Signed, Lanasia Porras David Stall, PA-C  12/14/2022 11:48 AM     [facility]

## 2022-12-14 ENCOUNTER — Ambulatory Visit: Payer: Commercial Managed Care - PPO | Attending: Medical | Admitting: Medical

## 2022-12-14 ENCOUNTER — Encounter: Payer: Self-pay | Admitting: Medical

## 2022-12-14 VITALS — BP 134/72 | HR 91 | Ht 72.0 in | Wt 289.4 lb

## 2022-12-14 DIAGNOSIS — I10 Essential (primary) hypertension: Secondary | ICD-10-CM

## 2022-12-14 DIAGNOSIS — I5022 Chronic systolic (congestive) heart failure: Secondary | ICD-10-CM

## 2022-12-14 DIAGNOSIS — I2510 Atherosclerotic heart disease of native coronary artery without angina pectoris: Secondary | ICD-10-CM | POA: Diagnosis not present

## 2022-12-14 DIAGNOSIS — I214 Non-ST elevation (NSTEMI) myocardial infarction: Secondary | ICD-10-CM

## 2022-12-14 DIAGNOSIS — I255 Ischemic cardiomyopathy: Secondary | ICD-10-CM | POA: Diagnosis not present

## 2022-12-14 DIAGNOSIS — E875 Hyperkalemia: Secondary | ICD-10-CM

## 2022-12-14 MED ORDER — NITROGLYCERIN 0.4 MG SL SUBL: 0.4000 mg | SUBLINGUAL_TABLET | SUBLINGUAL | 3 refills | Status: DC | PRN

## 2022-12-14 MED ORDER — ATORVASTATIN CALCIUM 80 MG PO TABS: 80.0000 mg | ORAL_TABLET | Freq: Every day | ORAL | 1 refills | Status: DC

## 2022-12-14 MED ORDER — ASPIRIN 81 MG PO TBEC: 81.0000 mg | DELAYED_RELEASE_TABLET | Freq: Every day | ORAL | Status: AC

## 2022-12-14 MED ORDER — ENTRESTO 24-26 MG PO TABS: 1.0000 | ORAL_TABLET | Freq: Two times a day (BID) | ORAL | 3 refills | Status: DC

## 2022-12-14 NOTE — Patient Instructions (Addendum)
Medication Instructions:  START taking the:  -Aspirin 81 mg once daily  -Atorvastatin 80 mg once daily  -Entresto 24-26 mg twice daily  Take Nitroglycerin as needed for chest pain: Place one tablet under the tongue as needed every 5 minutes for chest pain. If you have to use three tablets with no relief, please call 911 or go to your closest Emergency Department.   *If you need a refill on your cardiac medications before your next appointment, please call your pharmacy*   Lab Work: Your provider would like for you to have following labs drawn today BMET and CBC  Your provider would like for you to return in 2 weeks to have the following labs drawn: BMET.   Please go to [facility] [address] [address], [place] [address] You do not need an appointment.  They are open from 7:30 am-4 pm.  Lunch from 1:00 pm- 2:00 pm You will not need to be fasting.  If you have labs (blood work) drawn today and your tests are completely normal, you will receive your results only by: MyChart Message (if you have MyChart) OR A paper copy in the mail If you have any lab test that is abnormal or we need to change your treatment, we will call you to review the results.   Testing/Procedures: None ordered   Follow-Up: At [facility], you and your health needs are our priority.  As part of our continuing mission to provide you with exceptional heart care, we have created designated Provider Care Teams.  These Care Teams include your primary Cardiologist (physician) and Advanced Practice Providers (APPs -  Physician Assistants and Nurse Practitioners) who all work together to provide you with the care you need, when you need it.  We recommend signing up for the patient portal called "MyChart".  Sign up information is provided on this After Visit Summary.  MyChart is used to connect with patients for Virtual Visits (Telemedicine).  Patients are able to view  lab/test results, encounter notes, upcoming appointments, etc.  Non-urgent messages can be sent to your provider as well.   To learn more about what you can do with MyChart, go to [email or web address].    Your next appointment:   1 month(s)  Provider:   You may see Yvonne Kendall, MD or one of the following Advanced Practice Providers on your designated Care Team:   Cadence [place], [place]

## 2022-12-15 LAB — CBC
Hematocrit: 32.7 % — ABNORMAL LOW (ref 37.5–51.0)
Hemoglobin: 10.3 g/dL — ABNORMAL LOW (ref 13.0–17.7)
MCH: 24.7 pg — ABNORMAL LOW (ref 26.6–33.0)
MCHC: 31.5 g/dL (ref 31.5–35.7)
MCV: 78 fL — ABNORMAL LOW (ref 79–97)
Platelets: 359 10*3/uL (ref 150–450)
RBC: 4.17 x10E6/uL (ref 4.14–5.80)
RDW: 14 % (ref 11.6–15.4)
WBC: 5.3 10*3/uL (ref 3.4–10.8)

## 2022-12-15 LAB — BASIC METABOLIC PANEL
BUN/Creatinine Ratio: 14 (ref 9–20)
BUN: 12 mg/dL (ref 6–24)
CO2: 22 mmol/L (ref 20–29)
Calcium: 9.5 mg/dL (ref 8.7–10.2)
Chloride: 103 mmol/L (ref 96–106)
Creatinine, Ser: 0.85 mg/dL (ref 0.76–1.27)
Glucose: 100 mg/dL — ABNORMAL HIGH (ref 70–99)
Potassium: 4.4 mmol/L (ref 3.5–5.2)
Sodium: 140 mmol/L (ref 134–144)
eGFR: 105 mL/min/{1.73_m2} (ref >59)

## 2022-12-18 ENCOUNTER — Telehealth: Payer: Self-pay | Admitting: Medical

## 2022-12-18 NOTE — Telephone Encounter (Signed)
Pt c/o medication issue:  1. Name of Medication:  sacubitril-valsartan (ENTRESTO) 24-26 MG   2. How are you currently taking this medication (dosage and times per day)?   3. Are you having a reaction (difficulty breathing--STAT)?   4. What is your medication issue?   Darl Pikes, nurse case manager with [employer], states [facility] on file needs a verbal PA for [facility]. She provided phone # [phone] and PA# [phone] for [facility]. She would also like to know if there are any samples available at this office.  Patient calling the office for samples of medication:   1.  What medication and dosage are you requesting samples for? sacubitril-valsartan (ENTRESTO) 24-26 MG   2.  Are you currently out of this medication?   Patient hasn't started this medication.

## 2022-12-19 ENCOUNTER — Telehealth: Payer: Self-pay

## 2022-12-19 ENCOUNTER — Other Ambulatory Visit (HOSPITAL_COMMUNITY): Payer: Self-pay

## 2022-12-19 NOTE — Telephone Encounter (Signed)
Pharmacy Patient Advocate Encounter  Received notification from [facility] that Prior Authorization for ENTRESTO has been DENIED.  Full denial letter will be uploaded to the media tab. See denial reason below.  PLAN ONLY COVERS WHEN PT HAS OR HAS HAD SPONTANEOUS OR PROVOKABLE INCREASED LEFT VENTRICULAR FILLING PRESSURES. (FULL DENIAL IN CHART MEDIA)

## 2022-12-19 NOTE — Telephone Encounter (Signed)
The patient stated that he is unable to pick up the samples due to lack of transportation. The nurse will reach out to [employer].

## 2022-12-19 NOTE — Telephone Encounter (Addendum)
Spoke with [employer] [facility] regarding PA form for this patient. Good chance based on the questions that they will not approve. PA is pending.

## 2022-12-19 NOTE — Telephone Encounter (Signed)
Pt returning nurses call from earlier. Please advise 

## 2022-12-19 NOTE — Telephone Encounter (Signed)
Pharmacy Patient Advocate Encounter   Received notification from CoverMyMeds that prior authorization for ENTRESTO is required/requested.   Insurance verification completed.   The patient is insured through [facility] .   Per test claim: PA required; PA submitted to [facility] via CoverMyMeds Key/confirmation #/EOC [facility] Status is pending

## 2022-12-19 NOTE — Telephone Encounter (Signed)
Attempted to contact pt to provide samples.  Will forward message to PA department for prior auth

## 2022-12-20 NOTE — Telephone Encounter (Signed)
Nurse attempted to contact nurse care manager. Left message to call back

## 2022-12-21 ENCOUNTER — Other Ambulatory Visit: Payer: Self-pay

## 2022-12-21 MED ORDER — SACUBITRIL-VALSARTAN 24-26 MG PO TABS: 1.0000 | ORAL_TABLET | Freq: Two times a day (BID) | ORAL | 0 refills | Status: DC

## 2022-12-21 NOTE — Telephone Encounter (Signed)
Darl Pikes, from [employer] called stating she would like for the samples for the [facility] to be ready for the patient to pick-up.  Once they are ready if she can be called so she can arrange transportation for him.  She said a detailed message can be left on her voice mail.

## 2022-12-21 NOTE — Telephone Encounter (Signed)
Attempted to contact Darl Pikes from [employer] to advise that samples were ready for pick up.   Unable to reach Darl Pikes, will try again later.    Medication Samples have been set aside for the patient.  Drug name: Alexander Simon        Strength: 24/26        Qty: 56 tablets (2 bottles)  LOT: [id number]  Exp.Date: 06/2024  Dosing instructions: Take 1 tablet by mouth twice daily   The patient has been instructed regarding the correct time, dose, and frequency of taking this medication, including desired effects and most common side effects.   [name] 1:56 PM 12/21/2022

## 2022-12-24 NOTE — Progress Notes (Signed)
[address]       [place] [address]             [phone]     HPI: Alexander Simon is a 52 year old male with a past medical history of CAD, NSTEMI, GERD and HTN. The patient returns for routine postoperative follow-up having undergone CABG x 3 with radial artery by Dr. Cliffton Asters on 09/23. He had a routine postoperative hospital stay and was placed on GDMT prior to discharge. At his cardiology appointment on 10/18 he was noted to be doing well. He had not gotten his Entresto or Lipitor so these were added but insurance denied his Entresto prior authorization. He was not able to pick up samples due to lack of transportation. Spironolactone was discontinued in the hospital due to hyperkalemia and the patient is not on Lasix.  Since hospital discharge the patient admits to some chest soreness but no longer requires pain medication. He denies SOB, dizziness, lower extremity swelling and LOC. He is ambulating well and eager to start cardiac rehab.    Current Outpatient Medications  Medication Sig Dispense Refill   amLODipine (NORVASC) 5 MG tablet Take 1 tablet (5 mg total) by mouth daily. 30 tablet 0   aspirin EC 81 MG tablet Take 1 tablet (81 mg total) by mouth daily. Swallow whole.     atorvastatin (LIPITOR) 80 MG tablet Take 1 tablet (80 mg total) by mouth daily at 6 PM. 90 tablet 1   carvedilol (COREG) 12.5 MG tablet Take 1 tablet (12.5 mg total) by mouth 2 (two) times daily with a meal. 60 tablet 1   clopidogrel (PLAVIX) 75 MG tablet Take 1 tablet (75 mg total) by mouth daily. 30 tablet 1   empagliflozin (JARDIANCE) 10 MG TABS tablet Take 1 tablet (10 mg total) by mouth daily. 30 tablet 1   nitroGLYCERIN (NITROSTAT) 0.4 MG SL tablet Place 1 tablet (0.4 mg total) under the tongue every 5 (five) minutes as needed for chest pain. 25 tablet 3   oxyCODONE (OXY IR/ROXICODONE) 5 MG immediate release tablet Take 1 tablet (5 mg total) by mouth every 6 (six) hours as needed for  severe pain. 28 tablet 0   sacubitril-valsartan (ENTRESTO) 24-26 MG Take 1 tablet by mouth 2 (two) times daily. 180 tablet 3   sacubitril-valsartan (ENTRESTO) 24-26 MG Take 1 tablet by mouth 2 (two) times daily. 56 tablet 0   No current facility-administered medications for this visit.   Vitals: Today's Vitals   01/01/23 1352  BP: (!) 149/77  Pulse: 95  Resp: 20  SpO2: 92%  Weight: 287 lb (130.2 kg)  Height: 6' (1.829 m)   Body mass index is 38.92 kg/m.   Physical Exam: General: Alert and oriented, no acute distress CV: Regular rate and rhythm, no murmur Pulm: Clear to auscultation bilaterally GI: +BS, nontender, no distension Extremities: 1+ edema bilaterally Wound: Clean and dry without sign of infection. Left hand is neurovascularly intact  Diagnostic Tests: CXR read pending but looks like a small right sided pleural effusion   Impression/Plan: S/p CABG: The patient is progressing very well from sugrery. He is ambulating well daily and his sternal soreness continues to improve. He is no longer taking anything for his sternal soreness. He denies shortness of breath and leg swelling but on exam he has some edema in BLE. CXR also looks to show a small right sided pleural effusion. Will give PO Lasix 40mg  x 5days as well  as potassium supplement. His incisions are healing well without sign of infection. We reviewed slowly increasing lifting but no heavy lifting until 3 months postop. I will clear him for cardiac rehab today and he has been cleared to drive. Plan to have to patient return to clinic as needed.   HTN: The patient developed a dry cough when he stated Entresto. It also looks like insurance will not approve the [facility] and he is using samples at the moment. He does have samples left but I told him to discontinue this and we will start Losartan 50mg  daily tomorrow. He follows up with cardiology 11/18.  Left knee pain: Seems to be orthopedic as we did not harvest from the  left side, will have the patient follow up with PCP. He establishes care with a PCP on 11/11.    Alexander Reichmann, PA-C [facility] [phone]

## 2022-12-24 NOTE — Telephone Encounter (Signed)
Left a detail message for [employer] stating samples have been placed up front for pick.

## 2022-12-31 ENCOUNTER — Encounter: Payer: Self-pay | Attending: Internal Medicine

## 2022-12-31 ENCOUNTER — Other Ambulatory Visit: Payer: Self-pay

## 2022-12-31 ENCOUNTER — Other Ambulatory Visit: Payer: Self-pay | Admitting: Thoracic Surgery (Cardiothoracic Vascular Surgery)

## 2022-12-31 DIAGNOSIS — Z951 Presence of aortocoronary bypass graft: Secondary | ICD-10-CM | POA: Insufficient documentation

## 2022-12-31 DIAGNOSIS — I252 Old myocardial infarction: Secondary | ICD-10-CM | POA: Insufficient documentation

## 2022-12-31 DIAGNOSIS — I214 Non-ST elevation (NSTEMI) myocardial infarction: Secondary | ICD-10-CM

## 2022-12-31 DIAGNOSIS — Z5189 Encounter for other specified aftercare: Secondary | ICD-10-CM | POA: Insufficient documentation

## 2022-12-31 NOTE — Progress Notes (Signed)
Virtual Visit completed. Patient informed on EP and RD appointment and 6 Minute walk test. Patient also informed of patient health questionnaires on My Chart. Patient Verbalizes understanding. Visit diagnosis can be found in [facility] 11/30/2022.

## 2023-01-01 ENCOUNTER — Ambulatory Visit (INDEPENDENT_AMBULATORY_CARE_PROVIDER_SITE_OTHER): Payer: Self-pay | Admitting: Physician Assistant

## 2023-01-01 ENCOUNTER — Ambulatory Visit
Admission: RE | Admit: 2023-01-01 | Discharge: 2023-01-01 | Disposition: A | Discharge status: Home / Self Care | Payer: Commercial Managed Care - PPO | Source: Ambulatory Visit | Attending: Thoracic Surgery (Cardiothoracic Vascular Surgery)

## 2023-01-01 ENCOUNTER — Encounter: Payer: Self-pay | Admitting: Physician Assistant

## 2023-01-01 VITALS — BP 149/77 | HR 95 | Resp 20 | Ht 72.0 in | Wt 287.0 lb

## 2023-01-01 DIAGNOSIS — Z951 Presence of aortocoronary bypass graft: Secondary | ICD-10-CM

## 2023-01-01 MED ORDER — AMLODIPINE BESYLATE 5 MG PO TABS: 5.0000 mg | ORAL_TABLET | Freq: Every day | ORAL | 1 refills | Status: DC

## 2023-01-01 MED ORDER — LOSARTAN POTASSIUM 50 MG PO TABS: 50.0000 mg | ORAL_TABLET | Freq: Every day | ORAL | 1 refills | Status: DC

## 2023-01-01 MED ORDER — FUROSEMIDE 40 MG PO TABS: 40.0000 mg | ORAL_TABLET | Freq: Every day | ORAL | 0 refills | Status: DC

## 2023-01-01 MED ORDER — POTASSIUM CHLORIDE CRYS ER 20 MEQ PO TBCR: 20.0000 meq | EXTENDED_RELEASE_TABLET | Freq: Every day | ORAL | 0 refills | Status: DC

## 2023-01-01 NOTE — Telephone Encounter (Signed)
Pt stated his primary care stopped his Entresto and started him on losartan 50 mg daily due to a cough.    Furth, Cadence H, PA-C  You1 hour ago (3:21 PM)    We can send in Losartan 25mg  daily for now.

## 2023-01-01 NOTE — Patient Instructions (Signed)
Continue to avoid any heavy lifting or strenuous use of your arms or shoulders for at least a total of three months from the time of surgery.  After three months you may gradually increase how much you lift or otherwise use your arms or chest as tolerated, with limits based upon whether or not activities lead to the return of significant discomfort.   You may return to driving an automobile as long as you are no longer requiring oral narcotic pain relievers during the daytime.  It would be wise to start driving only short distances during the daylight and gradually increase from there as you feel comfortable.  You are encouraged to enroll and participate in the outpatient cardiac rehab program beginning as soon as practical. 

## 2023-01-03 ENCOUNTER — Ambulatory Visit: Payer: Commercial Managed Care - PPO

## 2023-01-07 ENCOUNTER — Encounter: Payer: Self-pay | Admitting: Physician Assistant

## 2023-01-07 ENCOUNTER — Ambulatory Visit: Payer: Commercial Managed Care - PPO | Admitting: Physician Assistant

## 2023-01-07 VITALS — BP 132/74 | HR 93 | Ht 72.0 in | Wt 292.0 lb

## 2023-01-07 DIAGNOSIS — D649 Anemia, unspecified: Secondary | ICD-10-CM | POA: Diagnosis not present

## 2023-01-07 DIAGNOSIS — I252 Old myocardial infarction: Secondary | ICD-10-CM

## 2023-01-07 DIAGNOSIS — G4700 Insomnia, unspecified: Secondary | ICD-10-CM | POA: Insufficient documentation

## 2023-01-07 MED ORDER — TRAZODONE HCL 50 MG PO TABS
25.0000 mg | ORAL_TABLET | Freq: Every evening | ORAL | 2 refills | Status: DC | PRN
Start: 2023-01-07 — End: 2023-05-31

## 2023-01-07 NOTE — Patient Instructions (Signed)
-  It was a pleasure to see you today! Please review your visit summary for helpful information -Lab results are usually available within 1-2 days and we will call once reviewed -I would encourage you to follow your care via MyChart where you can access lab results, notes, messages, and more -If you feel that we did a nice job today, please complete your after-visit survey and leave us a Google review! Your CMA today was Kieandra and your provider was Dan Waddell, PA-C, DMSc -Please return for follow-up in about 2 months  

## 2023-01-07 NOTE — Progress Notes (Signed)
Date:  01/07/2023   Name:  Alexander Simon   DOB:  09/28/1970   MRN:  [id number]   Chief Complaint: Establish Care  HPI Alexander Simon is a pleasant 52 year old male with history of CAD, recent NSTEMI s/p CABG x3, HTN, and GERD who presents to the practice today to establish care.  He recently suffered NSTEMI 11/12/2022 which was preceded by various chest pains for years prior.  He has since established with cardiology with next visit 01/14/2023.  Just had follow-up with CT surgery 01/01/2023, and begins cardiac rehab in 2 days.  Overall he reports doing well.  Taking all medications with good compliance.  Postsurgical soreness is subsiding and he is no longer taking any medication for this pain.  Complains of trouble with sleep onset and sleep maintenance, reports sleeping about 2-3 hours per night with naps during the day also about 2-3 hours.  Thinks it is related to recent healthcare stressors.  Has been searching for a PCP for a while.  Also wonders if it is okay to go back on his ED medication and engage in sexual activity.  He has nitroglycerin in the event of chest pain, but is not using it.  Per patient report cardiology advised he may return to sexual activity 6 weeks following surgery.  Chart review shows anemia dating as far back as 2019, but patient says he has never been told he has anemia.  Hemoglobin dropped as low as 8.7 while in the hospital.   Medication list has been reviewed and updated.  Current Meds  Medication Sig   amLODipine (NORVASC) 5 MG tablet Take 1 tablet (5 mg total) by mouth daily.   aspirin EC 81 MG tablet Take 1 tablet (81 mg total) by mouth daily. Swallow whole.   atorvastatin (LIPITOR) 80 MG tablet Take 1 tablet (80 mg total) by mouth daily at 6 PM.   carvedilol (COREG) 12.5 MG tablet Take 1 tablet (12.5 mg total) by mouth 2 (two) times daily with a meal.   clopidogrel (PLAVIX) 75 MG tablet Take 1 tablet (75 mg total) by mouth daily.   empagliflozin  (JARDIANCE) 10 MG TABS tablet Take 1 tablet (10 mg total) by mouth daily.   losartan (COZAAR) 50 MG tablet Take 1 tablet (50 mg total) by mouth daily.   nitroGLYCERIN (NITROSTAT) 0.4 MG SL tablet Place 1 tablet (0.4 mg total) under the tongue every 5 (five) minutes as needed for chest pain.   potassium chloride SA (KLOR-CON M) 20 MEQ tablet Take 1 tablet (20 mEq total) by mouth daily.   traZODone (DESYREL) 50 MG tablet Take 0.5-1 tablets (25-50 mg total) by mouth at bedtime as needed for sleep.     Review of Systems  Constitutional:  Positive for fatigue. Negative for fever.  Respiratory:  Negative for chest tightness and shortness of breath.   Cardiovascular:  Negative for chest pain and palpitations.  Gastrointestinal:  Negative for abdominal pain.  Psychiatric/Behavioral:  Positive for sleep disturbance.     Patient Active Problem List   Diagnosis Date Noted   Insomnia 01/07/2023   Anemia 01/07/2023   S/P CABG x 3 11/20/2022   H/O open surgical procurement of radial artery 11/20/2022   Dyslipidemia 11/13/2022   History of non-ST elevation myocardial infarction (NSTEMI) 11/12/2022   Class 2 severe obesity with serious comorbidity and body mass index (BMI) of 39.0 to 39.9 in adult [facility]) 11/12/2022   Prediabetes 11/12/2022   Mass of right side of neck  Skin tags, multiple acquired    Hypertension 02/20/2018   Chest pain 03/09/2017    No Known Allergies  Immunization History  Administered Date(s) Administered   PFIZER(Purple Top)SARS-COV-2 Vaccination 11/10/2019    Past Surgical History:  Procedure Laterality Date   CIRCUMCISION     CORONARY ARTERY BYPASS GRAFT N/A 11/19/2022   Procedure: CORONARY ARTERY BYPASS GRAFTING (CABG) X THREE  USING LEFT INTERNAL MAMMARY ARTERY, LEFT RADIAL ARTERY, AND ENDOSCOPICALLY HARVESTED RIGHT GREATER SAPHENOUS VEIN;  Surgeon: Corliss Skains, MD;  Location: [facility] OR;  Service: Open Heart Surgery;  Laterality: N/A;   EXCISION MASS NECK  Right 06/12/2019   Procedure: EXCISION Superficial MASS NECK;  Surgeon: Duanne Guess, MD;  Location: [facility] ORS;  Service: General;  Laterality: Right;   EXCISION OF SKIN TAG Bilateral 06/12/2019   Procedure: EXCISION OF SKIN TAG on neck;  Surgeon: Duanne Guess, MD;  Location: [facility] ORS;  Service: General;  Laterality: Bilateral;   LEFT HEART CATH AND CORONARY ANGIOGRAPHY N/A 11/13/2022   Procedure: LEFT HEART CATH AND CORONARY ANGIOGRAPHY;  Surgeon: Yvonne Kendall, MD;  Location: [facility] INVASIVE CV LAB;  Service: Cardiovascular;  Laterality: N/A;   RADIAL ARTERY HARVEST Left 11/19/2022   Procedure: LEFT RADIAL ARTERY HARVEST;  Surgeon: Corliss Skains, MD;  Location: [facility] OR;  Service: Open Heart Surgery;  Laterality: Left;   SHOULDER SURGERY Right 2014   TEE WITHOUT CARDIOVERSION N/A 11/19/2022   Procedure: TRANSESOPHAGEAL ECHOCARDIOGRAM;  Surgeon: Corliss Skains, MD;  Location: [facility];  Service: Open Heart Surgery;  Laterality: N/A;    Social History   Tobacco Use   Smoking status: Never   Smokeless tobacco: Never  Vaping Use   Vaping status: Never Used  Substance Use Topics   Alcohol use: No   Drug use: Never    Family History  Problem Relation Age of Onset   Breast cancer Mother    Lung cancer Mother    Cancer Maternal Uncle         01/07/2023    8:56 AM  GAD 7 : Generalized Anxiety Score  Nervous, Anxious, on Edge 0  Control/stop worrying 0  Worry too much - different things 0  Trouble relaxing 2  Restless 0  Easily annoyed or irritable 0  Afraid - awful might happen 0  Total GAD 7 Score 2  Anxiety Difficulty Not difficult at all       01/07/2023    8:55 AM 05/04/2019    8:46 AM 05/02/2018    8:35 AM  Depression screen PHQ 2/9  Decreased Interest 0 0 0  Down, Depressed, Hopeless 0 0 0  PHQ - 2 Score 0 0 0  Altered sleeping 3    Tired, decreased energy 3    Change in appetite 0    Feeling bad or failure about yourself  0    Trouble concentrating 0     Moving slowly or fidgety/restless 0    Suicidal thoughts 0    PHQ-9 Score 6    Difficult doing work/chores Not difficult at all      BP Readings from Last 3 Encounters:  01/07/23 132/74  01/01/23 (!) 149/77  12/14/22 134/72    Wt Readings from Last 3 Encounters:  01/07/23 292 lb (132.5 kg)  01/01/23 287 lb (130.2 kg)  12/14/22 289 lb 6.4 oz (131.3 kg)    BP 132/74   Pulse 93   Ht 6' (1.829 m)   Wt 292 lb (132.5 kg)   SpO2 96%  BMI 39.60 kg/m   Physical Exam Vitals and nursing note reviewed.  Constitutional:      Appearance: Normal appearance. He is obese.  Cardiovascular:     Rate and Rhythm: Normal rate and regular rhythm.     Heart sounds: No murmur heard.    No friction rub. No gallop.  Pulmonary:     Effort: Pulmonary effort is normal.     Breath sounds: Normal breath sounds.  Abdominal:     General: There is no distension.  Musculoskeletal:        General: Normal range of motion.  Skin:    General: Skin is warm and dry.  Neurological:     Mental Status: He is alert and oriented to person, place, and time.     Gait: Gait is intact.  Psychiatric:        Mood and Affect: Mood and affect normal.     Recent Labs     Component Value Date/Time   NA 140 12/14/2022 1117   NA 138 09/19/2012 0003   K 4.4 12/14/2022 1117   K 3.9 09/19/2012 0003   CL 103 12/14/2022 1117   CL 105 09/19/2012 0003   CO2 22 12/14/2022 1117   CO2 30 09/19/2012 0003   GLUCOSE 100 (H) 12/14/2022 1117   GLUCOSE 105 (H) 11/25/2022 0316   GLUCOSE 111 (H) 09/19/2012 0003   BUN 12 12/14/2022 1117   BUN 14 09/19/2012 0003   CREATININE 0.85 12/14/2022 1117   CREATININE 1.00 09/19/2012 0003   CALCIUM 9.5 12/14/2022 1117   CALCIUM 8.4 (L) 09/19/2012 0003   PROT 7.9 11/12/2022 2000   PROT 7.5 05/05/2018 1047   ALBUMIN 4.0 11/12/2022 2000   ALBUMIN 4.5 05/05/2018 1047   AST 23 11/12/2022 2000   ALT 34 11/12/2022 2000   ALKPHOS 50 11/12/2022 2000   BILITOT 0.9 11/12/2022 2000    BILITOT <0.2 05/05/2018 1047   GFRNONAA >60 11/25/2022 0316   GFRNONAA >60 09/19/2012 0003   GFRAA 101 05/05/2018 1047   GFRAA >60 09/19/2012 0003    Lab Results  Component Value Date   WBC 5.3 12/14/2022   HGB 10.3 (L) 12/14/2022   HCT 32.7 (L) 12/14/2022   MCV 78 (L) 12/14/2022   PLT 359 12/14/2022   Lab Results  Component Value Date   HGBA1C 6.2 (H) 11/12/2022   Lab Results  Component Value Date   CHOL 179 11/13/2022   HDL 35 (L) 11/13/2022   LDLCALC 103 (H) 11/13/2022   TRIG 203 (H) 11/13/2022   CHOLHDL 5.1 11/13/2022   Lab Results  Component Value Date   TSH 1.991 11/12/2022     Assessment and Plan:  1. History of non-ST elevation myocardial infarction (NSTEMI) Seems to be recovering nicely, continue specialist follow-up.  Continue taking all medications as prescribed by cardiology.  2. Insomnia, unspecified type Reviewed common practices to improve sleep hygiene including consistent sleep schedule, cool sleeping temperatures, keeping bedroom as dark as possible, daytime physical activity (especially resistance training), avoiding harmful bedtime habits (e.g. TV, phone use, etc),  avoiding oral intake within 1 hour of bed, avoiding evening caffeine/alcohol consumption.  Consider mindfulness exercises for sleep e.g. Calm app, yoga, etc.  Prescribing trazodone for use as needed to assist with sleep, which is important for his recovery.  - traZODone (DESYREL) 50 MG tablet; Take 0.5-1 tablets (25-50 mg total) by mouth at bedtime as needed for sleep.  Dispense: 30 tablet; Refill: 2  3. Anemia, unspecified type Repeat CBC  and BMP today, with plan to add on anemia labs if necessary - CBC with Differential/Platelet - Basic metabolic panel    Return in about 2 months (around 03/09/2023) for OV f/u chronic conditions.    Alvester Morin, PA-C, DMSc, Nutritionist [facility] [phone]

## 2023-01-08 LAB — CBC WITH DIFFERENTIAL/PLATELET
Basophils Absolute: 0.1 10*3/uL (ref 0.0–0.2)
Basos: 1 %
EOS (ABSOLUTE): 0.2 10*3/uL (ref 0.0–0.4)
Eos: 3 %
Hematocrit: 35.4 % — ABNORMAL LOW (ref 37.5–51.0)
Hemoglobin: 10.8 g/dL — ABNORMAL LOW (ref 13.0–17.7)
Immature Grans (Abs): 0 10*3/uL (ref 0.0–0.1)
Immature Granulocytes: 1 %
Lymphocytes Absolute: 1.6 10*3/uL (ref 0.7–3.1)
Lymphs: 29 %
MCH: 23.9 pg — ABNORMAL LOW (ref 26.6–33.0)
MCHC: 30.5 g/dL — ABNORMAL LOW (ref 31.5–35.7)
MCV: 79 fL (ref 79–97)
Monocytes Absolute: 0.6 10*3/uL (ref 0.1–0.9)
Monocytes: 11 %
Neutrophils Absolute: 3.2 10*3/uL (ref 1.4–7.0)
Neutrophils: 55 %
Platelets: 293 10*3/uL (ref 150–450)
RBC: 4.51 x10E6/uL (ref 4.14–5.80)
RDW: 14.3 % (ref 11.6–15.4)
WBC: 5.7 10*3/uL (ref 3.4–10.8)

## 2023-01-08 LAB — BASIC METABOLIC PANEL
BUN/Creatinine Ratio: 9 (ref 9–20)
BUN: 10 mg/dL (ref 6–24)
CO2: 25 mmol/L (ref 20–29)
Calcium: 9.4 mg/dL (ref 8.7–10.2)
Chloride: 105 mmol/L (ref 96–106)
Creatinine, Ser: 1.06 mg/dL (ref 0.76–1.27)
Glucose: 107 mg/dL — ABNORMAL HIGH (ref 70–99)
Potassium: 4.5 mmol/L (ref 3.5–5.2)
Sodium: 143 mmol/L (ref 134–144)
eGFR: 84 mL/min/{1.73_m2} (ref >59)

## 2023-01-09 VITALS — Ht 70.71 in | Wt 296.6 lb

## 2023-01-09 DIAGNOSIS — Z951 Presence of aortocoronary bypass graft: Secondary | ICD-10-CM | POA: Diagnosis not present

## 2023-01-09 DIAGNOSIS — I252 Old myocardial infarction: Secondary | ICD-10-CM | POA: Diagnosis not present

## 2023-01-09 DIAGNOSIS — Z5189 Encounter for other specified aftercare: Secondary | ICD-10-CM | POA: Diagnosis present

## 2023-01-09 DIAGNOSIS — I214 Non-ST elevation (NSTEMI) myocardial infarction: Secondary | ICD-10-CM

## 2023-01-09 LAB — IRON AND TIBC
Iron Saturation: 13 % — ABNORMAL LOW (ref 15–55)
Iron: 40 ug/dL (ref 38–169)
Total Iron Binding Capacity: 305 ug/dL (ref 250–450)
UIBC: 265 ug/dL (ref 111–343)

## 2023-01-09 LAB — SPECIMEN STATUS REPORT

## 2023-01-09 LAB — B12 AND FOLATE PANEL
Folate: 8.3 ng/mL (ref >3.0)
Vitamin B-12: 454 pg/mL (ref 232–1245)

## 2023-01-09 LAB — FERRITIN: Ferritin: 56 ng/mL (ref 30–400)

## 2023-01-09 NOTE — Progress Notes (Signed)
Cardiac Individual Treatment Plan  Patient Details  Name: Alexander Simon MRN: [id number] Date of Birth: 1971-01-24 Referring Provider:   Flowsheet Row Cardiac Rehab from 01/09/2023 in [facility] Cardiac and Pulmonary Rehab  Referring Provider Dr. Cristal Deer End       Initial Encounter Date:  Flowsheet Row Cardiac Rehab from 01/09/2023 in [facility] Cardiac and Pulmonary Rehab  Date 01/09/23       Visit Diagnosis: S/P CABG x 3  NSTEMI (non-ST elevated myocardial infarction) (HCC)  Patient's Home Medications on Admission:  Current Outpatient Medications:    amLODipine (NORVASC) 5 MG tablet, Take 1 tablet (5 mg total) by mouth daily., Disp: 30 tablet, Rfl: 1   aspirin EC 81 MG tablet, Take 1 tablet (81 mg total) by mouth daily. Swallow whole., Disp: , Rfl:    atorvastatin (LIPITOR) 80 MG tablet, Take 1 tablet (80 mg total) by mouth daily at 6 PM., Disp: 90 tablet, Rfl: 1   carvedilol (COREG) 12.5 MG tablet, Take 1 tablet (12.5 mg total) by mouth 2 (two) times daily with a meal., Disp: 60 tablet, Rfl: 1   clopidogrel (PLAVIX) 75 MG tablet, Take 1 tablet (75 mg total) by mouth daily., Disp: 30 tablet, Rfl: 1   empagliflozin (JARDIANCE) 10 MG TABS tablet, Take 1 tablet (10 mg total) by mouth daily., Disp: 30 tablet, Rfl: 1   losartan (COZAAR) 50 MG tablet, Take 1 tablet (50 mg total) by mouth daily., Disp: 30 tablet, Rfl: 1   nitroGLYCERIN (NITROSTAT) 0.4 MG SL tablet, Place 1 tablet (0.4 mg total) under the tongue every 5 (five) minutes as needed for chest pain., Disp: 25 tablet, Rfl: 3   potassium chloride SA (KLOR-CON M) 20 MEQ tablet, Take 1 tablet (20 mEq total) by mouth daily., Disp: 5 tablet, Rfl: 0   traZODone (DESYREL) 50 MG tablet, Take 0.5-1 tablets (25-50 mg total) by mouth at bedtime as needed for sleep., Disp: 30 tablet, Rfl: 2  Past Medical History: Past Medical History:  Diagnosis Date   GERD (gastroesophageal reflux disease)    no meds   Hypertension    No pertinent past  medical history     Tobacco Use: Social History   Tobacco Use  Smoking Status Never  Smokeless Tobacco Never    Labs: Review Flowsheet  More data exists      Latest Ref Rng & Units 05/05/2018 11/12/2022 11/13/2022 11/18/2022 11/19/2022  Labs for ITP Cardiac and Pulmonary Rehab  Cholestrol 0 - 200 mg/dL 253  - 664  - -  LDL (calc) 0 - 99 mg/dL 403  - 474  - -  HDL-C >40 mg/dL 40  - 35  - -  Trlycerides <150 mg/dL 259  - 563  - -  Hemoglobin A1c 4.8 - 5.6 % 6.3  6.2  - - -  PH, Arterial 7.35 - 7.45 - - - 7.39  7.336  7.311  7.283  7.312  7.415  7.304   PCO2 arterial 32 - 48 mmHg - - - 43  43.5  36.4  44.1  46.6  41.3  45.3   Bicarbonate 20.0 - 28.0 mmol/L - - - 26.0  23.0  18.3  20.9  23.6  26.5  22.5   TCO2 22 - 32 mmol/L - - - - 24  19  22  23  25  26  27  28  23  24  22    Acid-base deficit 0.0 - 2.0 mmol/L - - - - 2.0  7.0  6.0  3.0  4.0   O2 Saturation % - - - 98.1  95  95  96  93  100  99     Details       Multiple values from one day are sorted in reverse-chronological order          Exercise Target Goals: Exercise Program Goal: Individual exercise prescription set using results from initial 6 min walk test and THRR while considering  patient's activity barriers and safety.   Exercise Prescription Goal: Initial exercise prescription builds to 30-45 minutes a day of aerobic activity, 2-3 days per week.  Home exercise guidelines will be given to patient during program as part of exercise prescription that the participant will acknowledge.   Education: Aerobic Exercise: - Group verbal and visual presentation on the components of exercise prescription. Introduces F.I.T.T principle from ACSM for exercise prescriptions.  Reviews F.I.T.T. principles of aerobic exercise including progression. Written material given at graduation.   Education: Resistance Exercise: - Group verbal and visual presentation on the components of exercise prescription. Introduces F.I.T.T principle  from ACSM for exercise prescriptions  Reviews F.I.T.T. principles of resistance exercise including progression. Written material given at graduation.    Education: Exercise & Equipment Safety: - Individual verbal instruction and demonstration of equipment use and safety with use of the equipment. Flowsheet Row Cardiac Rehab from 01/09/2023 in [facility] Cardiac and Pulmonary Rehab  Date 01/09/23  Educator [facility]  Instruction Review Code 1- Verbalizes Understanding       Education: Exercise Physiology & General Exercise Guidelines: - Group verbal and written instruction with models to review the exercise physiology of the cardiovascular system and associated critical values. Provides general exercise guidelines with specific guidelines to those with heart or lung disease.  Flowsheet Row Cardiac Rehab from 01/09/2023 in [facility] Cardiac and Pulmonary Rehab  Education need identified 01/09/23       Education: Flexibility, Balance, Mind/Body Relaxation: - Group verbal and visual presentation with interactive activity on the components of exercise prescription. Introduces F.I.T.T principle from ACSM for exercise prescriptions. Reviews F.I.T.T. principles of flexibility and balance exercise training including progression. Also discusses the mind body connection.  Reviews various relaxation techniques to help reduce and manage stress (i.e. Deep breathing, progressive muscle relaxation, and visualization). Balance handout provided to take home. Written material given at graduation.   Activity Barriers & Risk Stratification:  Activity Barriers & Cardiac Risk Stratification - 01/09/23 1612       Activity Barriers & Cardiac Risk Stratification   Cardiac Risk Stratification High             6 Minute Walk:  6 Minute Walk     Row Name 01/09/23 1611         6 Minute Walk   Phase Initial     Distance 1385 feet     Walk Time 6 minutes     # of Rest Breaks 0     MPH 2.6     METS 3.64     RPE 12      Perceived Dyspnea  1     VO2 Peak 12.75     Symptoms No     Resting HR 87 bpm     Resting BP 124/62     Resting Oxygen Saturation  97 %     Exercise Oxygen Saturation  during 6 min walk 98 %     Max Ex. HR 113 bpm     Max Ex. BP 182/66  2 Minute Post BP 136/60              Oxygen Initial Assessment:   Oxygen Re-Evaluation:   Oxygen Discharge (Final Oxygen Re-Evaluation):   Initial Exercise Prescription:  Initial Exercise Prescription - 01/09/23 1600       Date of Initial Exercise RX and Referring Provider   Date 01/09/23    Referring Provider Dr. Cristal Deer End      Oxygen   Oxygen Continuous    Maintain Oxygen Saturation 88% or higher      Treadmill   MPH 2.5    Grade 0    Minutes 15    METs 2.91      REL-XR   Level 2    Speed 50    Minutes 15    METs 3.64      Intensity   THRR 40-80% of Max Heartrate 119-151    Ratings of Perceived Exertion 11-13    Perceived Dyspnea 0-4      Progression   Progression Continue to progress workloads to maintain intensity without signs/symptoms of physical distress.      Resistance Training   Training Prescription Yes    Weight 5 lb    Reps 10-15             Perform Capillary Blood Glucose checks as needed.  Exercise Prescription Changes:   Exercise Prescription Changes     Row Name 01/09/23 1600             Response to Exercise   Blood Pressure (Admit) 124/62       Blood Pressure (Exercise) 182/66       Blood Pressure (Exit) 136/60       Heart Rate (Admit) 87 bpm       Heart Rate (Exercise) 113 bpm       Heart Rate (Exit) 87 bpm       Oxygen Saturation (Admit) 97 %       Oxygen Saturation (Exercise) 98 %       Oxygen Saturation (Exit) 98 %       Rating of Perceived Exertion (Exercise) 12       Perceived Dyspnea (Exercise) 1       Symptoms none       Comments results                Exercise Comments:   Exercise Goals and Review:   Exercise Goals     Row Name  01/09/23 1615             Exercise Goals   Increase Physical Activity Yes       Intervention Provide advice, education, support and counseling about physical activity/exercise needs.;Develop an individualized exercise prescription for aerobic and resistive training based on initial evaluation findings, risk stratification, comorbidities and participant's personal goals.       Expected Outcomes Long Term: Exercising regularly at least 3-5 days a week.;Long Term: Add in home exercise to make exercise part of routine and to increase amount of physical activity.;Short Term: Attend rehab on a regular basis to increase amount of physical activity.       Increase Strength and Stamina Yes       Intervention Develop an individualized exercise prescription for aerobic and resistive training based on initial evaluation findings, risk stratification, comorbidities and participant's personal goals.;Provide advice, education, support and counseling about physical activity/exercise needs.       Expected Outcomes Long Term: Improve cardiorespiratory fitness, muscular endurance  and strength as measured by increased METs and functional capacity ( );Short Term: Perform resistance training exercises routinely during rehab and add in resistance training at home;Short Term: Increase workloads from initial exercise prescription for resistance, speed, and METs.       Able to understand and use rate of perceived exertion (RPE) scale Yes       Intervention Provide education and explanation on how to use RPE scale       Expected Outcomes Long Term:  Able to use RPE to guide intensity level when exercising independently;Short Term: Able to use RPE daily in rehab to express subjective intensity level       Able to understand and use Dyspnea scale Yes       Intervention Provide education and explanation on how to use Dyspnea scale       Expected Outcomes Long Term: Able to use Dyspnea scale to guide intensity level when  exercising independently;Short Term: Able to use Dyspnea scale daily in rehab to express subjective sense of shortness of breath during exertion       Knowledge and understanding of Target Heart Rate Range (THRR) Yes       Intervention Provide education and explanation of THRR including how the numbers were predicted and where they are located for reference       Expected Outcomes Short Term: Able to use daily as guideline for intensity in rehab;Long Term: Able to use THRR to govern intensity when exercising independently;Short Term: Able to state/look up THRR       Able to check pulse independently Yes       Intervention Review the importance of being able to check your own pulse for safety during independent exercise;Provide education and demonstration on how to check pulse in carotid and radial arteries.       Expected Outcomes Long Term: Able to check pulse independently and accurately;Short Term: Able to explain why pulse checking is important during independent exercise       Understanding of Exercise Prescription Yes       Intervention Provide education, explanation, and written materials on patient's individual exercise prescription       Expected Outcomes Long Term: Able to explain home exercise prescription to exercise independently;Short Term: Able to explain program exercise prescription                Exercise Goals Re-Evaluation :   Discharge Exercise Prescription (Final Exercise Prescription Changes):  Exercise Prescription Changes - 01/09/23 1600       Response to Exercise   Blood Pressure (Admit) 124/62    Blood Pressure (Exercise) 182/66    Blood Pressure (Exit) 136/60    Heart Rate (Admit) 87 bpm    Heart Rate (Exercise) 113 bpm    Heart Rate (Exit) 87 bpm    Oxygen Saturation (Admit) 97 %    Oxygen Saturation (Exercise) 98 %    Oxygen Saturation (Exit) 98 %    Rating of Perceived Exertion (Exercise) 12    Perceived Dyspnea (Exercise) 1    Symptoms none     Comments results             Nutrition:  Target Goals: Understanding of nutrition guidelines, daily intake of sodium 1500mg , cholesterol 200mg , calories 30% from fat and 7% or less from saturated fats, daily to have 5 or more servings of fruits and vegetables.  Education: All About Nutrition: -Group instruction provided by verbal, written material, interactive activities, discussions, models, and posters to  present general guidelines for heart healthy nutrition including fat, fiber, MyPlate, the role of sodium in heart healthy nutrition, utilization of the nutrition label, and utilization of this knowledge for meal planning. Follow up email sent as well. Written material given at graduation. Flowsheet Row Cardiac Rehab from 01/09/2023 in [facility] Cardiac and Pulmonary Rehab  Education need identified 01/09/23       Biometrics:  Pre Biometrics - 01/09/23 1616       Pre Biometrics   Height 5' 10.71" (1.796 m)    Weight 296 lb 9.6 oz (134.5 kg)    Waist Circumference 50.1 inches    Hip Circumference 46.5 inches    Waist to Hip Ratio 1.08 %    BMI (Calculated) 41.71    Single Leg Stand 21.5 seconds              Nutrition Therapy Plan and Nutrition Goals:   Nutrition Assessments:  MEDIFICTS Score Key: >=70 Need to make dietary changes  40-70 Heart Healthy Diet <= 40 Therapeutic Level Cholesterol Diet  Flowsheet Row Cardiac Rehab from 01/09/2023 in [facility] Cardiac and Pulmonary Rehab  Picture Your Plate Total Score on Admission 67      Picture Your Plate Scores: <96 Unhealthy dietary pattern with much room for improvement. 41-50 Dietary pattern unlikely to meet recommendations for good health and room for improvement. 51-60 More healthful dietary pattern, with some room for improvement.  >60 Healthy dietary pattern, although there may be some specific behaviors that could be improved.    Nutrition Goals Re-Evaluation:   Nutrition Goals Discharge (Final  Nutrition Goals Re-Evaluation):   Psychosocial: Target Goals: Acknowledge presence or absence of significant depression and/or stress, maximize coping skills, provide positive support system. Participant is able to verbalize types and ability to use techniques and skills needed for reducing stress and depression.   Education: Stress, Anxiety, and Depression - Group verbal and visual presentation to define topics covered.  Reviews how body is impacted by stress, anxiety, and depression.  Also discusses healthy ways to reduce stress and to treat/manage anxiety and depression.  Written material given at graduation.   Education: Sleep Hygiene -Provides group verbal and written instruction about how sleep can affect your health.  Define sleep hygiene, discuss sleep cycles and impact of sleep habits. Review good sleep hygiene tips.    Initial Review & Psychosocial Screening:  Initial Psych Review & Screening - 12/31/22 1026       Initial Review   Current issues with None Identified;Current Stress Concerns      Family Dynamics   Good Support System? Yes    Comments Tyrei states that he prays alot and can get through tough times. He lives alone and does not have a great support system.      Barriers   Psychosocial barriers to participate in program The patient should benefit from training in stress management and relaxation.;There are no identifiable barriers or psychosocial needs.      Screening Interventions   Interventions To provide support and resources with identified psychosocial needs;Provide feedback about the scores to participant;Encouraged to exercise    Expected Outcomes Short Term goal: Utilizing psychosocial counselor, staff and physician to assist with identification of specific Stressors or current issues interfering with healing process. Setting desired goal for each stressor or current issue identified.;Long Term Goal: Stressors or current issues are controlled or  eliminated.;Short Term goal: Identification and review with participant of any Quality of Life or Depression concerns found by scoring the questionnaire.;Long  Term goal: The participant improves quality of Life and PHQ9 Scores as seen by post scores and/or verbalization of changes             Quality of Life Scores:   Quality of Life - 01/09/23 1618       Quality of Life   Select Quality of Life      Quality of Life Scores   Health/Function Pre 24.4 %    Socioeconomic Pre 20.25 %    Psych/Spiritual Pre 29.14 %    Family Pre 21 %    GLOBAL Pre 23.91 %            Scores of 19 and below usually indicate a poorer quality of life in these areas.  A difference of  2-3 points is a clinically meaningful difference.  A difference of 2-3 points in the total score of the Quality of Life Index has been associated with significant improvement in overall quality of life, self-image, physical symptoms, and general health in studies assessing change in quality of life.  PHQ-9: Review Flowsheet  More data may exist      01/09/2023 01/07/2023 05/04/2019 05/02/2018 02/20/2018  Depression screen PHQ 2/9  Decreased Interest 0 0 0 0 0  Down, Depressed, Hopeless 0 0 0 0 0  PHQ - 2 Score 0 0 0 0 0  Altered sleeping 1 3 - - -  Tired, decreased energy 1 3 - - -  Change in appetite 0 0 - - -  Feeling bad or failure about yourself  0 0 - - -  Trouble concentrating 0 0 - - -  Moving slowly or fidgety/restless 0 0 - - -  Suicidal thoughts 0 0 - - -  PHQ-9 Score 2 6 - - -  Difficult doing work/chores Not difficult at all Not difficult at all - - -    Details           Interpretation of Total Score  Total Score Depression Severity:  1-4 = Minimal depression, 5-9 = Mild depression, 10-14 = Moderate depression, 15-19 = Moderately severe depression, 20-27 = Severe depression   Psychosocial Evaluation and Intervention:  Psychosocial Evaluation - 12/31/22 1027       Psychosocial Evaluation &  Interventions   Interventions Relaxation education;Stress management education;Encouraged to exercise with the program and follow exercise prescription    Comments Jeshawn states that he prays alot and can get through tough times. He lives alone and does not have a great support system.    Expected Outcomes Short: Start Heart[facility] to help with mood. Long: Maintain a healthy mental state    Continue Psychosocial Services  Follow up required by staff             Psychosocial Re-Evaluation:   Psychosocial Discharge (Final Psychosocial Re-Evaluation):   Vocational Rehabilitation: Provide vocational rehab assistance to qualifying candidates.   Vocational Rehab Evaluation & Intervention:   Education: Education Goals: Education classes will be provided on a variety of topics geared toward better understanding of heart health and risk factor modification. Participant will state understanding/return demonstration of topics presented as noted by education test scores.  Learning Barriers/Preferences:  Learning Barriers/Preferences - 12/31/22 1026       Learning Barriers/Preferences   Learning Barriers None    Learning Preferences None             General Cardiac Education Topics:  AED/CPR: - Group verbal and written instruction with the use of models to demonstrate the  basic use of the AED with the basic ABC's of resuscitation.   Anatomy and Cardiac Procedures: - Group verbal and visual presentation and models provide information about basic cardiac anatomy and function. Reviews the testing methods done to diagnose heart disease and the outcomes of the test results. Describes the treatment choices: Medical Management, Angioplasty, or Coronary Bypass Surgery for treating various heart conditions including Myocardial Infarction, Angina, Valve Disease, and Cardiac Arrhythmias.  Written material given at graduation. Flowsheet Row Cardiac Rehab from 01/09/2023 in [facility] Cardiac and  Pulmonary Rehab  Education need identified 01/09/23       Medication Safety: - Group verbal and visual instruction to review commonly prescribed medications for heart and lung disease. Reviews the medication, class of the drug, and side effects. Includes the steps to properly store meds and maintain the prescription regimen.  Written material given at graduation.   Intimacy: - Group verbal instruction through game format to discuss how heart and lung disease can affect sexual intimacy. Written material given at graduation..   Know Your Numbers and Heart Failure: - Group verbal and visual instruction to discuss disease risk factors for cardiac and pulmonary disease and treatment options.  Reviews associated critical values for Overweight/Obesity, Hypertension, Cholesterol, and Diabetes.  Discusses basics of heart failure: signs/symptoms and treatments.  Introduces Heart Failure Zone chart for action plan for heart failure.  Written material given at graduation. Flowsheet Row Cardiac Rehab from 01/09/2023 in [facility] Cardiac and Pulmonary Rehab  Education need identified 01/09/23       Infection Prevention: - Provides verbal and written material to individual with discussion of infection control including proper hand washing and proper equipment cleaning during exercise session. Flowsheet Row Cardiac Rehab from 01/09/2023 in [facility] Cardiac and Pulmonary Rehab  Date 01/09/23  Educator [facility]  Instruction Review Code 1- Verbalizes Understanding       Falls Prevention: - Provides verbal and written material to individual with discussion of falls prevention and safety. Flowsheet Row Cardiac Rehab from 01/09/2023 in [facility] Cardiac and Pulmonary Rehab  Date 01/09/23  Educator [facility]  Instruction Review Code 1- Verbalizes Understanding       Other: -Provides group and verbal instruction on various topics (see comments)   Knowledge Questionnaire Score:  Knowledge Questionnaire Score - 01/09/23  1618       Knowledge Questionnaire Score   Pre Score 19/26             Core Components/Risk Factors/Patient Goals at Admission:  Personal Goals and Risk Factors at Admission - 01/09/23 1619       Core Components/Risk Factors/Patient Goals on Admission    Weight Management Yes;Weight Loss;Obesity    Intervention Weight Management: Develop a combined nutrition and exercise program designed to reach desired caloric intake, while maintaining appropriate intake of nutrient and fiber, sodium and fats, and appropriate energy expenditure required for the weight goal.;Weight Management: Provide education and appropriate resources to help participant work on and attain dietary goals.;Weight Management/Obesity: Establish reasonable short term and long term weight goals.;Obesity: Provide education and appropriate resources to help participant work on and attain dietary goals.    Admit Weight 296 lb 9.6 oz (134.5 kg)    Goal Weight: Short Term 290 lb (131.5 kg)    Goal Weight: Long Term 250 lb (113.4 kg)    Expected Outcomes Weight Loss: Understanding of general recommendations for a balanced deficit meal plan, which promotes 1-2 lb weight loss per week and includes a negative energy balance of [phone] kcal/d;Short  Term: Continue to assess and modify interventions until short term weight is achieved;Long Term: Adherence to nutrition and physical activity/exercise program aimed toward attainment of established weight goal;Understanding recommendations for meals to include 15-35% energy as protein, 25-35% energy from fat, 35-60% energy from carbohydrates, less than 200mg  of dietary cholesterol, 20-35 gm of total fiber daily;Understanding of distribution of calorie intake throughout the day with the consumption of 4-5 meals/snacks    Hypertension Yes    Intervention Provide education on lifestyle modifcations including regular physical activity/exercise, weight management, moderate sodium restriction and  increased consumption of fresh fruit, vegetables, and low fat dairy, alcohol moderation, and smoking cessation.;Monitor prescription use compliance.    Expected Outcomes Short Term: Continued assessment and intervention until BP is < 140/39mm HG in hypertensive participants. < 130/41mm HG in hypertensive participants with diabetes, heart failure or chronic kidney disease.;Long Term: Maintenance of blood pressure at goal levels.    Lipids Yes    Intervention Provide education and support for participant on nutrition & aerobic/resistive exercise along with prescribed medications to achieve LDL 70mg , HDL >40mg .    Expected Outcomes Short Term: Participant states understanding of desired cholesterol values and is compliant with medications prescribed. Participant is following exercise prescription and nutrition guidelines.;Long Term: Cholesterol controlled with medications as prescribed, with individualized exercise RX and with personalized nutrition plan. Value goals: LDL < 70mg , HDL > 40 mg.             Education:Diabetes - Individual verbal and written instruction to review signs/symptoms of diabetes, desired ranges of glucose level fasting, after meals and with exercise. Acknowledge that pre and post exercise glucose checks will be done for 3 sessions at entry of program.   Core Components/Risk Factors/Patient Goals Review:    Core Components/Risk Factors/Patient Goals at Discharge (Final Review):    ITP Comments:  ITP Comments     Row Name 12/31/22 1025 01/09/23 1610         ITP Comments Virtual Visit completed. Patient informed on EP and RD appointment and 6 Minute walk test. Patient also informed of patient health questionnaires on My Chart. Patient Verbalizes understanding. Visit diagnosis can be found in [facility] 11/30/2022. Completed and gym orientation. Initial ITP created and sent for review to Dr. Bethann Punches, Medical Director.               Comments: Initial ITP

## 2023-01-09 NOTE — Patient Instructions (Signed)
Patient Instructions  Patient Details  Name: Alexander Simon MRN: [id number] Date of Birth: 16-Nov-1970 Referring Provider:  Yvonne Kendall, MD  Below are your personal goals for exercise, nutrition, and risk factors. Our goal is to help you stay on track towards obtaining and maintaining these goals. We will be discussing your progress on these goals with you throughout the program.  Initial Exercise Prescription:  Initial Exercise Prescription - 01/09/23 1600       Date of Initial Exercise RX and Referring Provider   Date 01/09/23    Referring Provider Dr. Cristal Deer End      Oxygen   Oxygen Continuous    Maintain Oxygen Saturation 88% or higher      Treadmill   MPH 2.5    Grade 0    Minutes 15    METs 2.91      REL-XR   Level 2    Speed 50    Minutes 15    METs 3.64      Intensity   THRR 40-80% of Max Heartrate 119-151    Ratings of Perceived Exertion 11-13    Perceived Dyspnea 0-4      Progression   Progression Continue to progress workloads to maintain intensity without signs/symptoms of physical distress.      Resistance Training   Training Prescription Yes    Weight 5 lb    Reps 10-15             Exercise Goals: Frequency: Be able to perform aerobic exercise two to three times per week in program working toward 2-5 days per week of home exercise.  Intensity: Work with a perceived exertion of 11 (fairly light) - 15 (hard) while following your exercise prescription.  We will make changes to your prescription with you as you progress through the program.   Duration: Be able to do 30 to 45 minutes of continuous aerobic exercise in addition to a 5 minute warm-up and a 5 minute cool-down routine.   Nutrition Goals: Your personal nutrition goals will be established when you do your nutrition analysis with the dietician.  The following are general nutrition guidelines to follow: Cholesterol < 200mg /day Sodium < 1500mg /day Fiber: Men over 50 yrs - 30  grams per day  Personal Goals:  Personal Goals and Risk Factors at Admission - 01/09/23 1619       Core Components/Risk Factors/Patient Goals on Admission    Weight Management Yes;Weight Loss;Obesity    Intervention Weight Management: Develop a combined nutrition and exercise program designed to reach desired caloric intake, while maintaining appropriate intake of nutrient and fiber, sodium and fats, and appropriate energy expenditure required for the weight goal.;Weight Management: Provide education and appropriate resources to help participant work on and attain dietary goals.;Weight Management/Obesity: Establish reasonable short term and long term weight goals.;Obesity: Provide education and appropriate resources to help participant work on and attain dietary goals.    Admit Weight 296 lb 9.6 oz (134.5 kg)    Goal Weight: Short Term 290 lb (131.5 kg)    Goal Weight: Long Term 250 lb (113.4 kg)    Expected Outcomes Weight Loss: Understanding of general recommendations for a balanced deficit meal plan, which promotes 1-2 lb weight loss per week and includes a negative energy balance of [id number] kcal/d;Short Term: Continue to assess and modify interventions until short term weight is achieved;Long Term: Adherence to nutrition and physical activity/exercise program aimed toward attainment of established weight goal;Understanding recommendations for meals to include 15-35%  energy as protein, 25-35% energy from fat, 35-60% energy from carbohydrates, less than 200mg  of dietary cholesterol, 20-35 gm of total fiber daily;Understanding of distribution of calorie intake throughout the day with the consumption of 4-5 meals/snacks    Hypertension Yes    Intervention Provide education on lifestyle modifcations including regular physical activity/exercise, weight management, moderate sodium restriction and increased consumption of fresh fruit, vegetables, and low fat dairy, alcohol moderation, and smoking  cessation.;Monitor prescription use compliance.    Expected Outcomes Short Term: Continued assessment and intervention until BP is < 140/45mm HG in hypertensive participants. < 130/16mm HG in hypertensive participants with diabetes, heart failure or chronic kidney disease.;Long Term: Maintenance of blood pressure at goal levels.    Lipids Yes    Intervention Provide education and support for participant on nutrition & aerobic/resistive exercise along with prescribed medications to achieve LDL 70mg , HDL >40mg .    Expected Outcomes Short Term: Participant states understanding of desired cholesterol values and is compliant with medications prescribed. Participant is following exercise prescription and nutrition guidelines.;Long Term: Cholesterol controlled with medications as prescribed, with individualized exercise RX and with personalized nutrition plan. Value goals: LDL < 70mg , HDL > 40 mg.             Exercise Goals and Review:  Exercise Goals     Row Name 01/09/23 1615             Exercise Goals   Increase Physical Activity Yes       Intervention Provide advice, education, support and counseling about physical activity/exercise needs.;Develop an individualized exercise prescription for aerobic and resistive training based on initial evaluation findings, risk stratification, comorbidities and participant's personal goals.       Expected Outcomes Long Term: Exercising regularly at least 3-5 days a week.;Long Term: Add in home exercise to make exercise part of routine and to increase amount of physical activity.;Short Term: Attend rehab on a regular basis to increase amount of physical activity.       Increase Strength and Stamina Yes       Intervention Develop an individualized exercise prescription for aerobic and resistive training based on initial evaluation findings, risk stratification, comorbidities and participant's personal goals.;Provide advice, education, support and counseling  about physical activity/exercise needs.       Expected Outcomes Long Term: Improve cardiorespiratory fitness, muscular endurance and strength as measured by increased METs and functional capacity ( );Short Term: Perform resistance training exercises routinely during rehab and add in resistance training at home;Short Term: Increase workloads from initial exercise prescription for resistance, speed, and METs.       Able to understand and use rate of perceived exertion (RPE) scale Yes       Intervention Provide education and explanation on how to use RPE scale       Expected Outcomes Long Term:  Able to use RPE to guide intensity level when exercising independently;Short Term: Able to use RPE daily in rehab to express subjective intensity level       Able to understand and use Dyspnea scale Yes       Intervention Provide education and explanation on how to use Dyspnea scale       Expected Outcomes Long Term: Able to use Dyspnea scale to guide intensity level when exercising independently;Short Term: Able to use Dyspnea scale daily in rehab to express subjective sense of shortness of breath during exertion       Knowledge and understanding of Target Heart Rate Range (THRR) Yes  Intervention Provide education and explanation of THRR including how the numbers were predicted and where they are located for reference       Expected Outcomes Short Term: Able to use daily as guideline for intensity in rehab;Long Term: Able to use THRR to govern intensity when exercising independently;Short Term: Able to state/look up THRR       Able to check pulse independently Yes       Intervention Review the importance of being able to check your own pulse for safety during independent exercise;Provide education and demonstration on how to check pulse in carotid and radial arteries.       Expected Outcomes Long Term: Able to check pulse independently and accurately;Short Term: Able to explain why pulse checking is  important during independent exercise       Understanding of Exercise Prescription Yes       Intervention Provide education, explanation, and written materials on patient's individual exercise prescription       Expected Outcomes Long Term: Able to explain home exercise prescription to exercise independently;Short Term: Able to explain program exercise prescription                Copy of goals given to participant.

## 2023-01-10 LAB — SPECIMEN STATUS REPORT

## 2023-01-10 LAB — PSA TOTAL (REFLEX TO FREE): Prostate Specific Ag, Serum: 0.9 ng/mL (ref 0.0–4.0)

## 2023-01-14 ENCOUNTER — Ambulatory Visit: Payer: Commercial Managed Care - PPO | Attending: Medical | Admitting: Medical

## 2023-01-14 ENCOUNTER — Encounter: Payer: Self-pay | Admitting: Medical

## 2023-01-14 ENCOUNTER — Encounter: Payer: Self-pay | Admitting: *Deleted

## 2023-01-14 VITALS — BP 134/70 | HR 83 | Ht 72.0 in | Wt 295.6 lb

## 2023-01-14 DIAGNOSIS — I2510 Atherosclerotic heart disease of native coronary artery without angina pectoris: Secondary | ICD-10-CM

## 2023-01-14 DIAGNOSIS — I10 Essential (primary) hypertension: Secondary | ICD-10-CM

## 2023-01-14 DIAGNOSIS — I5020 Unspecified systolic (congestive) heart failure: Secondary | ICD-10-CM

## 2023-01-14 DIAGNOSIS — I255 Ischemic cardiomyopathy: Secondary | ICD-10-CM

## 2023-01-14 DIAGNOSIS — Z5189 Encounter for other specified aftercare: Secondary | ICD-10-CM | POA: Diagnosis not present

## 2023-01-14 DIAGNOSIS — Z79899 Other long term (current) drug therapy: Secondary | ICD-10-CM

## 2023-01-14 DIAGNOSIS — Z951 Presence of aortocoronary bypass graft: Secondary | ICD-10-CM

## 2023-01-14 DIAGNOSIS — I214 Non-ST elevation (NSTEMI) myocardial infarction: Secondary | ICD-10-CM

## 2023-01-14 DIAGNOSIS — E782 Mixed hyperlipidemia: Secondary | ICD-10-CM

## 2023-01-14 NOTE — Progress Notes (Signed)
Daily Session Note  Patient Details  Name: Alexander Simon MRN: [id number] Date of Birth: 1971-01-05 Referring Provider:   Flowsheet Row Cardiac Rehab from 01/09/2023 in [facility] Cardiac and Pul[facility]  Referring Provider Dr. Cristal Deer End       Encounter Date: 01/14/2023  Check In:  Session Check In - 01/14/23 0936       Check-In   Supervising physician immediately available to respond to emergencies See telemetry face sheet for immediately available ER MD    Location [facility]-Cardiac & Pulmonary Rehab    Staff Present Rory Percy, MS, Exercise Physiologist;Krista Karleen Hampshire RN, Mabeline Caras, BS, ACSM CEP, Exercise Physiologist;Luan Maberry Katrinka Blazing, RN, ADN    Virtual Visit No    Medication changes reported     No    Fall or balance concerns reported    No    Warm-up and Cool-down Performed on first and last piece of equipment    Resistance Training Performed Yes    VAD Patient? No    PAD/SET Patient? No      Pain Assessment   Currently in Pain? No/denies                Social History   Tobacco Use  Smoking Status Never  Smokeless Tobacco Never    Goals Met:  Independence with exercise equipment Exercise tolerated well No report of concerns or symptoms today Strength training completed today  Goals Unmet:  Not Applicable  Comments: First full day of exercise!  Patient was oriented to gym and equipment including functions, settings, policies, and procedures.  Patient's individual exercise prescription and treatment plan were reviewed.  All starting workloads were established based on the results of the 6 minute walk test done at initial orientation visit.  The plan for exercise progression was also introduced and progression will be customized based on patient's performance and goals.    Dr. Bethann Punches is Medical Director for [facility] Cardiac Rehabilitation.  Dr. Vida Rigger is Medical Director for [facility] Pulmonary Rehabilitation.

## 2023-01-14 NOTE — Progress Notes (Signed)
Cardiology Office Note:    Date:  01/14/2023   ID:  Alexander Simon, DOB August 23, 1970, MRN [id number]  PCP:  Remo Lipps, PA  [facility] Cardiologist:  Yvonne Kendall, MD  [facility] Electrophysiologist:  None   Referring MD: No ref. provider found   Chief Complaint: 1 month follow-up  History of Present Illness:    Alexander Simon is a 52 y.o. male with a hx of Hypertension, obesity, CAD s/p CABG x 3, HFmrEF, ICM, prediabetes who is being seen for follow-up.    The patient presented to [facility] 11/12/22 with chest pain. HS troponin elevated to 700s. Echo showed LVEF 45-50%, moderate LVH, G2DD. Cardiac cath showed severe 3V CAD and he was transferred to [facility] for CABG consult. The patient underwent CABG x3 using LIMA to LAD, Radial artery to OM and SVG to diagonal on 9/23. Post-op patient required some IV diuresis. He was started on Plavix for NSTEMI. He was transitioned to [place], [place], and [place]. [name] was held for elevated K.   The patient was last seen 12/14/22 and was overall doing well from a cardiac perspective.   Today, the patient reports he has been doing cardiac rehab. He goes back to work in December. He reports chest soreness along the incision line.  He has some shortness of breath when he overexerts himself, but this is overall improving. He walks on his treadmill at home. He has trouble sleeping at night, but he naps during the day. Sherryll Burger was making him cough and he is now on Losartan. He has trivial lower leg edema. He reports he eats low salt diet.    Past Medical History:  Diagnosis Date   GERD (gastroesophageal reflux disease)    no meds   Hypertension    No pertinent past medical history     Past Surgical History:  Procedure Laterality Date   CIRCUMCISION     CORONARY ARTERY BYPASS GRAFT N/A 11/19/2022   Procedure: CORONARY ARTERY BYPASS GRAFTING (CABG) X THREE  USING LEFT INTERNAL MAMMARY ARTERY, LEFT RADIAL ARTERY, AND ENDOSCOPICALLY HARVESTED  RIGHT GREATER SAPHENOUS VEIN;  Surgeon: Corliss Skains, MD;  Location: [facility] OR;  Service: Open Heart Surgery;  Laterality: N/A;   EXCISION MASS NECK Right 06/12/2019   Procedure: EXCISION Superficial MASS NECK;  Surgeon: Duanne Guess, MD;  Location: [facility] ORS;  Service: General;  Laterality: Right;   EXCISION OF SKIN TAG Bilateral 06/12/2019   Procedure: EXCISION OF SKIN TAG on neck;  Surgeon: Duanne Guess, MD;  Location: [facility] ORS;  Service: General;  Laterality: Bilateral;   LEFT HEART CATH AND CORONARY ANGIOGRAPHY N/A 11/13/2022   Procedure: LEFT HEART CATH AND CORONARY ANGIOGRAPHY;  Surgeon: Yvonne Kendall, MD;  Location: [facility] INVASIVE CV LAB;  Service: Cardiovascular;  Laterality: N/A;   RADIAL ARTERY HARVEST Left 11/19/2022   Procedure: LEFT RADIAL ARTERY HARVEST;  Surgeon: Corliss Skains, MD;  Location: [facility] OR;  Service: Open Heart Surgery;  Laterality: Left;   SHOULDER SURGERY Right 2014   TEE WITHOUT CARDIOVERSION N/A 11/19/2022   Procedure: TRANSESOPHAGEAL ECHOCARDIOGRAM;  Surgeon: Corliss Skains, MD;  Location: [facility] OR;  Service: Open Heart Surgery;  Laterality: N/A;    Current Medications: Current Meds  Medication Sig   amLODipine (NORVASC) 5 MG tablet Take 1 tablet (5 mg total) by mouth daily.   aspirin EC 81 MG tablet Take 1 tablet (81 mg total) by mouth daily. Swallow whole.   atorvastatin (LIPITOR) 80 MG tablet Take 1 tablet (80  mg total) by mouth daily at 6 PM.   carvedilol (COREG) 12.5 MG tablet Take 1 tablet (12.5 mg total) by mouth 2 (two) times daily with a meal.   clopidogrel (PLAVIX) 75 MG tablet Take 1 tablet (75 mg total) by mouth daily.   empagliflozin (JARDIANCE) 10 MG TABS tablet Take 1 tablet (10 mg total) by mouth daily.   losartan (COZAAR) 50 MG tablet Take 1 tablet (50 mg total) by mouth daily.   nitroGLYCERIN (NITROSTAT) 0.4 MG SL tablet Place 1 tablet (0.4 mg total) under the tongue every 5 (five) minutes as needed for chest pain.   potassium  chloride SA (KLOR-CON M) 20 MEQ tablet Take 1 tablet (20 mEq total) by mouth daily.   traZODone (DESYREL) 50 MG tablet Take 0.5-1 tablets (25-50 mg total) by mouth at bedtime as needed for sleep.     Allergies:   Patient has no known allergies.   Social History   Socioeconomic History   Marital status: Single    Spouse name: Not on file   Number of children: Not on file   Years of education: Not on file   Highest education level: Not on file  Occupational History   Not on file  Tobacco Use   Smoking status: Never   Smokeless tobacco: Never  Vaping Use   Vaping status: Never Used  Substance and Sexual Activity   Alcohol use: No   Drug use: Never   Sexual activity: Not Currently    Birth control/protection: None  Other Topics Concern   Not on file  Social History Narrative   Not on file   Social Determinants of Health   Financial Resource Strain: Not on file  Food Insecurity: No Food Insecurity (11/22/2022)   Hunger Vital Sign    Worried About Running Out of Food in the Last Year: Never true    Ran Out of Food in the Last Year: Never true  Transportation Needs: No Transportation Needs (11/22/2022)   PRAPARE - Administrator, Civil Service (Medical): No    Lack of Transportation (Non-Medical): No  Physical Activity: Not on file  Stress: Not on file  Social Connections: Not on file     Family History: The patient's family history includes Breast cancer in his mother; Cancer in his maternal uncle; Lung cancer in his mother.  ROS:   Please see the history of present illness.     All other systems reviewed and are negative.  EKGs/Labs/Other Studies Reviewed:    The following studies were reviewed today:  [facility] 10/2022 Conclusions: Multivessel CAD, including 50% ostial LMCA stenosis, mild luminal irregularities involving the LAD, 90% ostial and mid/distal LCx lesions, and sequential 50-60% RCA stenoses. LVEDP 18 mmHg.   Recommendations: Transfer to [facility] for CABG evaluation.  Ostial LCx lesion would be very difficult to treat percutaneously due to angulation of the vessel relative to the LMCA and LAD.  Additionally, there is at least moderate, bordering on severe ostial LMCA and sequential mid/distal RCA disease. Resume heparin infusion 2 hours after TR band removal. Aggressive secondary prevention of CAD.   Yvonne Kendall, MD [facility] Coronary Diagrams  Diagnostic Dominance: Right     Echo 10/2022  1. Left ventricular ejection fraction, by estimation, is 45 to 50%. The  left ventricle has mildly decreased function. The left ventricle  demonstrates regional wall motion abnormalities (see scoring  diagram/findings for description). There is moderate  left ventricular hypertrophy. Left ventricular diastolic parameters are  consistent with Grade II diastolic dysfunction (pseudonormalization).  Elevated left atrial pressure. There is severe hypokinesis of the left  ventricular, basal-mid inferior wall and  inferolateral wall.   2. Right ventricular systolic function is normal. The right ventricular  size is normal. Tricuspid regurgitation signal is inadequate for assessing  PA pressure.   3. The mitral valve is degenerative. Trivial mitral valve regurgitation.  No evidence of mitral stenosis.   4. The aortic valve was not well visualized. Aortic valve regurgitation  is not visualized. No aortic stenosis is present.   EKG:  EKG is not ordered today.    Recent Labs: 11/12/2022: ALT 34; TSH 1.991 11/20/2022: Magnesium 2.6 01/07/2023: BUN 10; Creatinine, Ser 1.06; Hemoglobin 10.8; Platelets 293; Potassium 4.5; Sodium 143  Recent Lipid Panel    Component Value Date/Time   CHOL 179 11/13/2022 0503   CHOL 208 (H) 05/05/2018 1047   TRIG 203 (H) 11/13/2022 0503   HDL 35 (L) 11/13/2022 0503   HDL 40 05/05/2018 1047   CHOLHDL 5.1 11/13/2022 0503   VLDL 41 (H) 11/13/2022 0503   LDLCALC 103 (H) 11/13/2022 0503   LDLCALC 129  (H) 05/05/2018 1047     Physical Exam:    VS:  BP 134/70 (BP Location: Left Arm, Patient Position: Sitting)   Pulse 83   Ht 6' (1.829 m)   Wt 295 lb 9.6 oz (134.1 kg)   SpO2 97%   BMI 40.09 kg/m     Wt Readings from Last 3 Encounters:  01/14/23 295 lb 9.6 oz (134.1 kg)  01/09/23 296 lb 9.6 oz (134.5 kg)  01/07/23 292 lb (132.5 kg)     GEN:  Well nourished, well developed in no acute distress HEENT: Normal NECK: No JVD; No carotid bruits LYMPHATICS: No lymphadenopathy CARDIAC: RRR, no murmurs, rubs, gallops RESPIRATORY:  Clear to auscultation without rales, wheezing or rhonchi  ABDOMEN: Soft, non-tender, non-distended MUSCULOSKELETAL:  No edema; No deformity  SKIN: Warm and dry NEUROLOGIC:  Alert and oriented x 3 PSYCHIATRIC:  Normal affect   ASSESSMENT:    1. Coronary artery disease involving native coronary artery of native heart without angina pectoris   2. Non-ST elevation (NSTEMI) myocardial infarction (HCC)   3. Medication management   4. HFrEF (heart failure with reduced ejection fraction) (HCC)   5. Ischemic cardiomyopathy   6. Essential hypertension   7. Hyperlipidemia, mixed    PLAN:    In order of problems listed above:  NSTEMI CAD s/p CABG The patient is overall recovering well. He starts work back in December. He is doing cardiac rehab and feels this is helping him. He does have chest soreness, 2/10. He also has SOB when he overexerts himself, but this is improving. Continue Aspirin 81mg  daily, Plavix 75mg  daily, Lipitor 80mg  daily, Coreg 12.5mg  BID, SL NTG  HFmrEF ICM Echo in 10/2022 showed LVEF 45-50%. He did not tolerate Entresto due to cough and spiro due to hyperkalemia. Continue Jardiance 10mg  daily, Losartan 50mg  daily and Coreg 12.5mg  BID. I will update a limited echocardiogram.   HTN BP is mildly above goal. Continue current medications. Can increase at follow-up if still elevated.   HLD LDL 103, HDL 35, TG 203, Total chol 179. I will  re-check a lipid panel today. Continue Lipitor 80mg  daily.    Disposition: Follow up in 2 month(s) with MD/APP    Signed, Vanisha Whiten David Stall, PA-C  01/14/2023 11:29 AM    [facility]

## 2023-01-14 NOTE — Patient Instructions (Signed)
Medication Instructions:  Your physician recommends that you continue on your current medications as directed. Please refer to the Current Medication list given to you today.   *If you need a refill on your cardiac medications before your next appointment, please call your pharmacy*   Lab Work: Your provider would like for you to have following labs drawn today (Lipid).     Testing/Procedures: Your physician has requested that you have a Limited echocardiogram in 1 month. Echocardiography is a painless test that uses sound waves to create images of your heart. It provides your doctor with information about the size and shape of your heart and how well your heart's chambers and valves are working.   You may receive an ultrasound enhancing agent through an IV if needed to better visualize your heart during the echo. This procedure takes approximately one hour.  There are no restrictions for this procedure.  This will take place at [address] [facility]) [address], [place] [address]  Please note: We ask at that you not bring children with you during ultrasound (echo/ vascular) testing. Due to room size and safety concerns, children are not allowed in the ultrasound rooms during exams. Our front office staff cannot provide observation of children in our lobby area while testing is being conducted. An adult accompanying a patient to their appointment will only be allowed in the ultrasound room at the discretion of the ultrasound technician under special circumstances. We apologize for any inconvenience.    Follow-Up: At [facility], you and your health needs are our priority.  As part of our continuing mission to provide you with exceptional heart care, we have created designated Provider Care Teams.  These Care Teams include your primary Cardiologist (physician) and Advanced Practice Providers (APPs -  Physician Assistants and Nurse Practitioners) who all work together to  provide you with the care you need, when you need it.  We recommend signing up for the patient portal called "MyChart".  Sign up information is provided on this After Visit Summary.  MyChart is used to connect with patients for Virtual Visits (Telemedicine).  Patients are able to view lab/test results, encounter notes, upcoming appointments, etc.  Non-urgent messages can be sent to your provider as well.   To learn more about what you can do with MyChart, go to [email or web address].    Your next appointment:   2 month(s)  Provider:   You may see Yvonne Kendall, MD or one of the following Advanced Practice Providers on your designated Care Team:   Nicolasa Ducking, NP Eula Listen, PA-C Cadence Fransico Michael, PA-C Charlsie Quest, NP Carlos Levering, NP

## 2023-01-15 LAB — LIPID PANEL
Chol/HDL Ratio: 2.7 ratio (ref 0.0–5.0)
Cholesterol, Total: 106 mg/dL (ref 100–199)
HDL: 40 mg/dL (ref >39)
LDL Chol Calc (NIH): 50 mg/dL (ref 0–99)
Triglycerides: 75 mg/dL (ref 0–149)
VLDL Cholesterol Cal: 16 mg/dL (ref 5–40)

## 2023-01-16 ENCOUNTER — Encounter: Payer: Self-pay | Admitting: *Deleted

## 2023-01-16 DIAGNOSIS — Z5189 Encounter for other specified aftercare: Secondary | ICD-10-CM | POA: Diagnosis not present

## 2023-01-16 DIAGNOSIS — Z951 Presence of aortocoronary bypass graft: Secondary | ICD-10-CM

## 2023-01-16 NOTE — Progress Notes (Signed)
Cardiac Individual Treatment Plan  Patient Details  Name: Alexander Simon MRN: [id number] Date of Birth: 02/03/1971 Referring Provider:   Flowsheet Row Cardiac Rehab from 01/09/2023 in [facility] Cardiac and Pulmonary Rehab  Referring Provider Dr. Cristal Deer End       Initial Encounter Date:  Flowsheet Row Cardiac Rehab from 01/09/2023 in [facility] Cardiac and Pulmonary Rehab  Date 01/09/23       Visit Diagnosis: S/P CABG x 3  Patient's Home Medications on Admission:  Current Outpatient Medications:    amLODipine (NORVASC) 5 MG tablet, Take 1 tablet (5 mg total) by mouth daily., Disp: 30 tablet, Rfl: 1   aspirin EC 81 MG tablet, Take 1 tablet (81 mg total) by mouth daily. Swallow whole., Disp: , Rfl:    atorvastatin (LIPITOR) 80 MG tablet, Take 1 tablet (80 mg total) by mouth daily at 6 PM., Disp: 90 tablet, Rfl: 1   carvedilol (COREG) 12.5 MG tablet, Take 1 tablet (12.5 mg total) by mouth 2 (two) times daily with a meal., Disp: 60 tablet, Rfl: 1   clopidogrel (PLAVIX) 75 MG tablet, Take 1 tablet (75 mg total) by mouth daily., Disp: 30 tablet, Rfl: 1   empagliflozin (JARDIANCE) 10 MG TABS tablet, Take 1 tablet (10 mg total) by mouth daily., Disp: 30 tablet, Rfl: 1   losartan (COZAAR) 50 MG tablet, Take 1 tablet (50 mg total) by mouth daily., Disp: 30 tablet, Rfl: 1   nitroGLYCERIN (NITROSTAT) 0.4 MG SL tablet, Place 1 tablet (0.4 mg total) under the tongue every 5 (five) minutes as needed for chest pain., Disp: 25 tablet, Rfl: 3   potassium chloride SA (KLOR-CON M) 20 MEQ tablet, Take 1 tablet (20 mEq total) by mouth daily., Disp: 5 tablet, Rfl: 0   traZODone (DESYREL) 50 MG tablet, Take 0.5-1 tablets (25-50 mg total) by mouth at bedtime as needed for sleep., Disp: 30 tablet, Rfl: 2  Past Medical History: Past Medical History:  Diagnosis Date   GERD (gastroesophageal reflux disease)    no meds   Hypertension    No pertinent past medical history     Tobacco Use: Social History    Tobacco Use  Smoking Status Never  Smokeless Tobacco Never    Labs: Review Flowsheet  More data exists      Latest Ref Rng & Units 11/12/2022 11/13/2022 11/18/2022 11/19/2022 01/14/2023  Labs for ITP Cardiac and Pulmonary Rehab  Cholestrol 100 - 199 mg/dL - 578  - - 469   LDL (calc) 0 - 99 mg/dL - 629  - - 50   HDL-C >52 mg/dL - 35  - - 40   Trlycerides 0 - 149 mg/dL - 841  - - 75   Hemoglobin A1c 4.8 - 5.6 % 6.2  - - - -  PH, Arterial 7.35 - 7.45 - - 7.39  7.336  7.311  7.283  7.312  7.415  7.304  -  PCO2 arterial 32 - 48 mmHg - - 43  43.5  36.4  44.1  46.6  41.3  45.3  -  Bicarbonate 20.0 - 28.0 mmol/L - - 26.0  23.0  18.3  20.9  23.6  26.5  22.5  -  TCO2 22 - 32 mmol/L - - - 24  19  22  23  25  26  27  28  23  24  22   -  Acid-base deficit 0.0 - 2.0 mmol/L - - - 2.0  7.0  6.0  3.0  4.0  -  O2 Saturation % - - 98.1  95  95  96  93  100  99  -    Details       Multiple values from one day are sorted in reverse-chronological order          Exercise Target Goals: Exercise Program Goal: Individual exercise prescription set using results from initial 6 min walk test and THRR while considering  patient's activity barriers and safety.   Exercise Prescription Goal: Initial exercise prescription builds to 30-45 minutes a day of aerobic activity, 2-3 days per week.  Home exercise guidelines will be given to patient during program as part of exercise prescription that the participant will acknowledge.   Education: Aerobic Exercise: - Group verbal and visual presentation on the components of exercise prescription. Introduces F.I.T.T principle from ACSM for exercise prescriptions.  Reviews F.I.T.T. principles of aerobic exercise including progression. Written material given at graduation.   Education: Resistance Exercise: - Group verbal and visual presentation on the components of exercise prescription. Introduces F.I.T.T principle from ACSM for exercise prescriptions  Reviews  F.I.T.T. principles of resistance exercise including progression. Written material given at graduation.    Education: Exercise & Equipment Safety: - Individual verbal instruction and demonstration of equipment use and safety with use of the equipment. Flowsheet Row Cardiac Rehab from 01/16/2023 in [facility] Cardiac and Pulmonary Rehab  Date 01/09/23  Educator [facility]  Instruction Review Code 1- Verbalizes Understanding       Education: Exercise Physiology & General Exercise Guidelines: - Group verbal and written instruction with models to review the exercise physiology of the cardiovascular system and associated critical values. Provides general exercise guidelines with specific guidelines to those with heart or lung disease.  Flowsheet Row Cardiac Rehab from 01/16/2023 in [facility] Cardiac and Pulmonary Rehab  Education need identified 01/09/23       Education: Flexibility, Balance, Mind/Body Relaxation: - Group verbal and visual presentation with interactive activity on the components of exercise prescription. Introduces F.I.T.T principle from ACSM for exercise prescriptions. Reviews F.I.T.T. principles of flexibility and balance exercise training including progression. Also discusses the mind body connection.  Reviews various relaxation techniques to help reduce and manage stress (i.e. Deep breathing, progressive muscle relaxation, and visualization). Balance handout provided to take home. Written material given at graduation.   Activity Barriers & Risk Stratification:  Activity Barriers & Cardiac Risk Stratification - 01/09/23 1612       Activity Barriers & Cardiac Risk Stratification   Cardiac Risk Stratification High             6 Minute Walk:  6 Minute Walk     Row Name 01/09/23 1611         6 Minute Walk   Phase Initial     Distance 1385 feet     Walk Time 6 minutes     # of Rest Breaks 0     MPH 2.6     METS 3.64     RPE 12     Perceived Dyspnea  1     VO2 Peak 12.75      Symptoms No     Resting HR 87 bpm     Resting BP 124/62     Resting Oxygen Saturation  97 %     Exercise Oxygen Saturation  during 6 min walk 98 %     Max Ex. HR 113 bpm     Max Ex. BP 182/66     2 Minute Post BP 136/60  Oxygen Initial Assessment:   Oxygen Re-Evaluation:   Oxygen Discharge (Final Oxygen Re-Evaluation):   Initial Exercise Prescription:  Initial Exercise Prescription - 01/09/23 1600       Date of Initial Exercise RX and Referring Provider   Date 01/09/23    Referring Provider Dr. Cristal Deer End      Oxygen   Oxygen Continuous    Maintain Oxygen Saturation 88% or higher      Treadmill   MPH 2.5    Grade 0    Minutes 15    METs 2.91      REL-XR   Level 2    Speed 50    Minutes 15    METs 3.64      Intensity   THRR 40-80% of Max Heartrate 119-151    Ratings of Perceived Exertion 11-13    Perceived Dyspnea 0-4      Progression   Progression Continue to progress workloads to maintain intensity without signs/symptoms of physical distress.      Resistance Training   Training Prescription Yes    Weight 5 lb    Reps 10-15             Perform Capillary Blood Glucose checks as needed.  Exercise Prescription Changes:   Exercise Prescription Changes     Row Name 01/09/23 1600             Response to Exercise   Blood Pressure (Admit) 124/62       Blood Pressure (Exercise) 182/66       Blood Pressure (Exit) 136/60       Heart Rate (Admit) 87 bpm       Heart Rate (Exercise) 113 bpm       Heart Rate (Exit) 87 bpm       Oxygen Saturation (Admit) 97 %       Oxygen Saturation (Exercise) 98 %       Oxygen Saturation (Exit) 98 %       Rating of Perceived Exertion (Exercise) 12       Perceived Dyspnea (Exercise) 1       Symptoms none       Comments results                Exercise Comments:   Exercise Comments     Row Name 01/14/23 0937           Exercise Comments First full day of exercise!   Patient was oriented to gym and equipment including functions, settings, policies, and procedures.  Patient's individual exercise prescription and treatment plan were reviewed.  All starting workloads were established based on the results of the 6 minute walk test done at initial orientation visit.  The plan for exercise progression was also introduced and progression will be customized based on patient's performance and goals.                Exercise Goals and Review:   Exercise Goals     Row Name 01/09/23 1615             Exercise Goals   Increase Physical Activity Yes       Intervention Provide advice, education, support and counseling about physical activity/exercise needs.;Develop an individualized exercise prescription for aerobic and resistive training based on initial evaluation findings, risk stratification, comorbidities and participant's personal goals.       Expected Outcomes Long Term: Exercising regularly at least 3-5 days a week.;Long Term: Add in home exercise to  make exercise part of routine and to increase amount of physical activity.;Short Term: Attend rehab on a regular basis to increase amount of physical activity.       Increase Strength and Stamina Yes       Intervention Develop an individualized exercise prescription for aerobic and resistive training based on initial evaluation findings, risk stratification, comorbidities and participant's personal goals.;Provide advice, education, support and counseling about physical activity/exercise needs.       Expected Outcomes Long Term: Improve cardiorespiratory fitness, muscular endurance and strength as measured by increased METs and functional capacity ( );Short Term: Perform resistance training exercises routinely during rehab and add in resistance training at home;Short Term: Increase workloads from initial exercise prescription for resistance, speed, and METs.       Able to understand and use rate of perceived  exertion (RPE) scale Yes       Intervention Provide education and explanation on how to use RPE scale       Expected Outcomes Long Term:  Able to use RPE to guide intensity level when exercising independently;Short Term: Able to use RPE daily in rehab to express subjective intensity level       Able to understand and use Dyspnea scale Yes       Intervention Provide education and explanation on how to use Dyspnea scale       Expected Outcomes Long Term: Able to use Dyspnea scale to guide intensity level when exercising independently;Short Term: Able to use Dyspnea scale daily in rehab to express subjective sense of shortness of breath during exertion       Knowledge and understanding of Target Heart Rate Range (THRR) Yes       Intervention Provide education and explanation of THRR including how the numbers were predicted and where they are located for reference       Expected Outcomes Short Term: Able to use daily as guideline for intensity in rehab;Long Term: Able to use THRR to govern intensity when exercising independently;Short Term: Able to state/look up THRR       Able to check pulse independently Yes       Intervention Review the importance of being able to check your own pulse for safety during independent exercise;Provide education and demonstration on how to check pulse in carotid and radial arteries.       Expected Outcomes Long Term: Able to check pulse independently and accurately;Short Term: Able to explain why pulse checking is important during independent exercise       Understanding of Exercise Prescription Yes       Intervention Provide education, explanation, and written materials on patient's individual exercise prescription       Expected Outcomes Long Term: Able to explain home exercise prescription to exercise independently;Short Term: Able to explain program exercise prescription                Exercise Goals Re-Evaluation :  Exercise Goals Re-Evaluation     Row Name  01/14/23 [phone]             Exercise Goal Re-Evaluation   Exercise Goals Review Able to understand and use rate of perceived exertion (RPE) scale;Able to understand and use Dyspnea scale;Knowledge and understanding of Target Heart Rate Range (THRR);Understanding of Exercise Prescription       Comments Reviewed RPE and dyspnea scale, THR and program prescription with pt today.  Pt voiced understanding and was given a copy of goals to take home.  Expected Outcomes Short: Use RPE daily to regulate intensity. Long: Follow program prescription in THR.                Discharge Exercise Prescription (Final Exercise Prescription Changes):  Exercise Prescription Changes - 01/09/23 1600       Response to Exercise   Blood Pressure (Admit) 124/62    Blood Pressure (Exercise) 182/66    Blood Pressure (Exit) 136/60    Heart Rate (Admit) 87 bpm    Heart Rate (Exercise) 113 bpm    Heart Rate (Exit) 87 bpm    Oxygen Saturation (Admit) 97 %    Oxygen Saturation (Exercise) 98 %    Oxygen Saturation (Exit) 98 %    Rating of Perceived Exertion (Exercise) 12    Perceived Dyspnea (Exercise) 1    Symptoms none    Comments results             Nutrition:  Target Goals: Understanding of nutrition guidelines, daily intake of sodium 1500mg , cholesterol 200mg , calories 30% from fat and 7% or less from saturated fats, daily to have 5 or more servings of fruits and vegetables.  Education: All About Nutrition: -Group instruction provided by verbal, written material, interactive activities, discussions, models, and posters to present general guidelines for heart healthy nutrition including fat, fiber, MyPlate, the role of sodium in heart healthy nutrition, utilization of the nutrition label, and utilization of this knowledge for meal planning. Follow up email sent as well. Written material given at graduation. Flowsheet Row Cardiac Rehab from 01/16/2023 in [facility] Cardiac and Pulmonary Rehab   Education need identified 01/09/23       Biometrics:  Pre Biometrics - 01/09/23 1616       Pre Biometrics   Height 5' 10.71" (1.796 m)    Weight 296 lb 9.6 oz (134.5 kg)    Waist Circumference 50.1 inches    Hip Circumference 46.5 inches    Waist to Hip Ratio 1.08 %    BMI (Calculated) 41.71    Single Leg Stand 21.5 seconds              Nutrition Therapy Plan and Nutrition Goals:   Nutrition Assessments:  MEDIFICTS Score Key: >=70 Need to make dietary changes  40-70 Heart Healthy Diet <= 40 Therapeutic Level Cholesterol Diet  Flowsheet Row Cardiac Rehab from 01/09/2023 in [facility] Cardiac and Pulmonary Rehab  Picture Your Plate Total Score on Admission 67      Picture Your Plate Scores: <06 Unhealthy dietary pattern with much room for improvement. 41-50 Dietary pattern unlikely to meet recommendations for good health and room for improvement. 51-60 More healthful dietary pattern, with some room for improvement.  >60 Healthy dietary pattern, although there may be some specific behaviors that could be improved.    Nutrition Goals Re-Evaluation:   Nutrition Goals Discharge (Final Nutrition Goals Re-Evaluation):   Psychosocial: Target Goals: Acknowledge presence or absence of significant depression and/or stress, maximize coping skills, provide positive support system. Participant is able to verbalize types and ability to use techniques and skills needed for reducing stress and depression.   Education: Stress, Anxiety, and Depression - Group verbal and visual presentation to define topics covered.  Reviews how body is impacted by stress, anxiety, and depression.  Also discusses healthy ways to reduce stress and to treat/manage anxiety and depression.  Written material given at graduation.   Education: Sleep Hygiene -Provides group verbal and written instruction about how sleep can affect your health.  Define sleep  hygiene, discuss sleep cycles and impact of  sleep habits. Review good sleep hygiene tips.    Initial Review & Psychosocial Screening:  Initial Psych Review & Screening - 12/31/22 1026       Initial Review   Current issues with None Identified;Current Stress Concerns      Family Dynamics   Good Support System? Yes    Comments Alexander Simon states that he prays alot and can get through tough times. He lives alone and does not have a great support system.      Barriers   Psychosocial barriers to participate in program The patient should benefit from training in stress management and relaxation.;There are no identifiable barriers or psychosocial needs.      Screening Interventions   Interventions To provide support and resources with identified psychosocial needs;Provide feedback about the scores to participant;Encouraged to exercise    Expected Outcomes Short Term goal: Utilizing psychosocial counselor, staff and physician to assist with identification of specific Stressors or current issues interfering with healing process. Setting desired goal for each stressor or current issue identified.;Long Term Goal: Stressors or current issues are controlled or eliminated.;Short Term goal: Identification and review with participant of any Quality of Life or Depression concerns found by scoring the questionnaire.;Long Term goal: The participant improves quality of Life and PHQ9 Scores as seen by post scores and/or verbalization of changes             Quality of Life Scores:   Quality of Life - 01/09/23 1618       Quality of Life   Select Quality of Life      Quality of Life Scores   Health/Function Pre 24.4 %    Socioeconomic Pre 20.25 %    Psych/Spiritual Pre 29.14 %    Family Pre 21 %    GLOBAL Pre 23.91 %            Scores of 19 and below usually indicate a poorer quality of life in these areas.  A difference of  2-3 points is a clinically meaningful difference.  A difference of 2-3 points in the total score of the Quality of  Life Index has been associated with significant improvement in overall quality of life, self-image, physical symptoms, and general health in studies assessing change in quality of life.  PHQ-9: Review Flowsheet  More data may exist      01/09/2023 01/07/2023 05/04/2019 05/02/2018 02/20/2018  Depression screen PHQ 2/9  Decreased Interest 0 0 0 0 0  Down, Depressed, Hopeless 0 0 0 0 0  PHQ - 2 Score 0 0 0 0 0  Altered sleeping 1 3 - - -  Tired, decreased energy 1 3 - - -  Change in appetite 0 0 - - -  Feeling bad or failure about yourself  0 0 - - -  Trouble concentrating 0 0 - - -  Moving slowly or fidgety/restless 0 0 - - -  Suicidal thoughts 0 0 - - -  PHQ-9 Score 2 6 - - -  Difficult doing work/chores Not difficult at all Not difficult at all - - -    Details           Interpretation of Total Score  Total Score Depression Severity:  1-4 = Minimal depression, 5-9 = Mild depression, 10-14 = Moderate depression, 15-19 = Moderately severe depression, 20-27 = Severe depression   Psychosocial Evaluation and Intervention:  Psychosocial Evaluation - 12/31/22 1027  Psychosocial Evaluation & Interventions   Interventions Relaxation education;Stress management education;Encouraged to exercise with the program and follow exercise prescription    Comments Alexander Simon states that he prays alot and can get through tough times. He lives alone and does not have a great support system.    Expected Outcomes Short: Start [facility] to help with mood. Long: Maintain a healthy mental state    Continue Psychosocial Services  Follow up required by staff             Psychosocial Re-Evaluation:   Psychosocial Discharge (Final Psychosocial Re-Evaluation):   Vocational Rehabilitation: Provide vocational rehab assistance to qualifying candidates.   Vocational Rehab Evaluation & Intervention:   Education: Education Goals: Education classes will be provided on a variety of topics geared  toward better understanding of heart health and risk factor modification. Participant will state understanding/return demonstration of topics presented as noted by education test scores.  Learning Barriers/Preferences:  Learning Barriers/Preferences - 12/31/22 1026       Learning Barriers/Preferences   Learning Barriers None    Learning Preferences None             General Cardiac Education Topics:  AED/CPR: - Group verbal and written instruction with the use of models to demonstrate the basic use of the AED with the basic ABC's of resuscitation.   Anatomy and Cardiac Procedures: - Group verbal and visual presentation and models provide information about basic cardiac anatomy and function. Reviews the testing methods done to diagnose heart disease and the outcomes of the test results. Describes the treatment choices: Medical Management, Angioplasty, or Coronary Bypass Surgery for treating various heart conditions including Myocardial Infarction, Angina, Valve Disease, and Cardiac Arrhythmias.  Written material given at graduation. Flowsheet Row Cardiac Rehab from 01/16/2023 in [facility] Cardiac and Pulmonary Rehab  Education need identified 01/09/23  Date 01/16/23  Educator SB  Instruction Review Code 1- Verbalizes Understanding       Medication Safety: - Group verbal and visual instruction to review commonly prescribed medications for heart and lung disease. Reviews the medication, class of the drug, and side effects. Includes the steps to properly store meds and maintain the prescription regimen.  Written material given at graduation.   Intimacy: - Group verbal instruction through game format to discuss how heart and lung disease can affect sexual intimacy. Written material given at graduation..   Know Your Numbers and Heart Failure: - Group verbal and visual instruction to discuss disease risk factors for cardiac and pulmonary disease and treatment options.  Reviews associated  critical values for Overweight/Obesity, Hypertension, Cholesterol, and Diabetes.  Discusses basics of heart failure: signs/symptoms and treatments.  Introduces Heart Failure Zone chart for action plan for heart failure.  Written material given at graduation. Flowsheet Row Cardiac Rehab from 01/16/2023 in [facility] Cardiac and Pulmonary Rehab  Education need identified 01/09/23       Infection Prevention: - Provides verbal and written material to individual with discussion of infection control including proper hand washing and proper equipment cleaning during exercise session. Flowsheet Row Cardiac Rehab from 01/16/2023 in [facility] Cardiac and Pulmonary Rehab  Date 01/09/23  Educator [facility]  Instruction Review Code 1- Verbalizes Understanding       Falls Prevention: - Provides verbal and written material to individual with discussion of falls prevention and safety. Flowsheet Row Cardiac Rehab from 01/16/2023 in [facility] Cardiac and Pulmonary Rehab  Date 01/09/23  Educator [facility]  Instruction Review Code 1- Verbalizes Understanding       Other: -  Provides group and verbal instruction on various topics (see comments)   Knowledge Questionnaire Score:  Knowledge Questionnaire Score - 01/09/23 1618       Knowledge Questionnaire Score   Pre Score 19/26             Core Components/Risk Factors/Patient Goals at Admission:  Personal Goals and Risk Factors at Admission - 01/09/23 1619       Core Components/Risk Factors/Patient Goals on Admission    Weight Management Yes;Weight Loss;Obesity    Intervention Weight Management: Develop a combined nutrition and exercise program designed to reach desired caloric intake, while maintaining appropriate intake of nutrient and fiber, sodium and fats, and appropriate energy expenditure required for the weight goal.;Weight Management: Provide education and appropriate resources to help participant work on and attain dietary goals.;Weight Management/Obesity:  Establish reasonable short term and long term weight goals.;Obesity: Provide education and appropriate resources to help participant work on and attain dietary goals.    Admit Weight 296 lb 9.6 oz (134.5 kg)    Goal Weight: Short Term 290 lb (131.5 kg)    Goal Weight: Long Term 250 lb (113.4 kg)    Expected Outcomes Weight Loss: Understanding of general recommendations for a balanced deficit meal plan, which promotes 1-2 lb weight loss per week and includes a negative energy balance of [id number] kcal/d;Short Term: Continue to assess and modify interventions until short term weight is achieved;Long Term: Adherence to nutrition and physical activity/exercise program aimed toward attainment of established weight goal;Understanding recommendations for meals to include 15-35% energy as protein, 25-35% energy from fat, 35-60% energy from carbohydrates, less than 200mg  of dietary cholesterol, 20-35 gm of total fiber daily;Understanding of distribution of calorie intake throughout the day with the consumption of 4-5 meals/snacks    Hypertension Yes    Intervention Provide education on lifestyle modifcations including regular physical activity/exercise, weight management, moderate sodium restriction and increased consumption of fresh fruit, vegetables, and low fat dairy, alcohol moderation, and smoking cessation.;Monitor prescription use compliance.    Expected Outcomes Short Term: Continued assessment and intervention until BP is < 140/49mm HG in hypertensive participants. < 130/65mm HG in hypertensive participants with diabetes, heart failure or chronic kidney disease.;Long Term: Maintenance of blood pressure at goal levels.    Lipids Yes    Intervention Provide education and support for participant on nutrition & aerobic/resistive exercise along with prescribed medications to achieve LDL 70mg , HDL >40mg .    Expected Outcomes Short Term: Participant states understanding of desired cholesterol values and is  compliant with medications prescribed. Participant is following exercise prescription and nutrition guidelines.;Long Term: Cholesterol controlled with medications as prescribed, with individualized exercise RX and with personalized nutrition plan. Value goals: LDL < 70mg , HDL > 40 mg.             Education:Diabetes - Individual verbal and written instruction to review signs/symptoms of diabetes, desired ranges of glucose level fasting, after meals and with exercise. Acknowledge that pre and post exercise glucose checks will be done for 3 sessions at entry of program.   Core Components/Risk Factors/Patient Goals Review:    Core Components/Risk Factors/Patient Goals at Discharge (Final Review):    ITP Comments:  ITP Comments     Row Name 12/31/22 1025 01/09/23 1610 01/14/23 0937 01/16/23 0956     ITP Comments Virtual Visit completed. Patient informed on EP and RD appointment and 6 Minute walk test. Patient also informed of patient health questionnaires on My Chart. Patient Verbalizes understanding. Visit diagnosis can be  found in [facility] 11/30/2022. Completed and gym orientation. Initial ITP created and sent for review to Dr. Bethann Punches, Medical Director. First full day of exercise!  Patient was oriented to gym and equipment including functions, settings, policies, and procedures.  Patient's individual exercise prescription and treatment plan were reviewed.  All starting workloads were established based on the results of the 6 minute walk test done at initial orientation visit.  The plan for exercise progression was also introduced and progression will be customized based on patient's performance and goals. 30 Day review completed. Medical Director ITP review done, changes made as directed, and signed approval by Medical Director.    new to program             Comments:

## 2023-01-16 NOTE — Progress Notes (Signed)
Daily Session Note  Patient Details  Name: FINNEAN FYOCK MRN: [id number] Date of Birth: 1971-01-12 Referring Provider:   Flowsheet Row Cardiac Rehab from 01/09/2023 in [facility] Cardiac and Pulmonary Rehab  Referring Provider Dr. Cristal Deer End       Encounter Date: 01/16/2023  Check In:  Session Check In - 01/16/23 0926       Check-In   Supervising physician immediately available to respond to emergencies See telemetry face sheet for immediately available ER MD    Location [facility]-Cardiac & Pulmonary Rehab    Staff Present Rory Percy, MS, Exercise Physiologist;Joseph Reino Kent, RCP,RRT,BSRT;Maxon Elgin BS, , Exercise Physiologist;Marykay Mccleod Katrinka Blazing, RN, ADN    Virtual Visit No    Medication changes reported     No    Fall or balance concerns reported    No    Warm-up and Cool-down Performed on first and last piece of equipment    Resistance Training Performed Yes    VAD Patient? No    PAD/SET Patient? No      Pain Assessment   Currently in Pain? No/denies                Social History   Tobacco Use  Smoking Status Never  Smokeless Tobacco Never    Goals Met:  Independence with exercise equipment Exercise tolerated well No report of concerns or symptoms today Strength training completed today  Goals Unmet:  Not Applicable  Comments: Pt able to follow exercise prescription today without complaint.  Will continue to monitor for progression.    Dr. Bethann Punches is Medical Director for [facility] Cardiac Rehabilitation.  Dr. Vida Rigger is Medical Director for [facility] Pulmonary Rehabilitation.

## 2023-01-23 ENCOUNTER — Encounter: Payer: Self-pay | Admitting: *Deleted

## 2023-01-23 ENCOUNTER — Other Ambulatory Visit: Payer: Self-pay | Admitting: Physician Assistant

## 2023-01-23 DIAGNOSIS — Z1211 Encounter for screening for malignant neoplasm of colon: Secondary | ICD-10-CM

## 2023-01-23 DIAGNOSIS — D649 Anemia, unspecified: Secondary | ICD-10-CM

## 2023-01-23 MED ORDER — EMPAGLIFLOZIN 10 MG PO TABS: 10.0000 mg | ORAL_TABLET | Freq: Every day | ORAL | 1 refills | Status: DC

## 2023-01-23 MED ORDER — CARVEDILOL 12.5 MG PO TABS: 12.5000 mg | ORAL_TABLET | Freq: Two times a day (BID) | ORAL | 1 refills | Status: DC

## 2023-01-23 MED ORDER — AMLODIPINE BESYLATE 5 MG PO TABS: 5.0000 mg | ORAL_TABLET | Freq: Every day | ORAL | 1 refills | Status: DC

## 2023-01-23 MED ORDER — CLOPIDOGREL BISULFATE 75 MG PO TABS: 75.0000 mg | ORAL_TABLET | Freq: Every day | ORAL | 1 refills | Status: DC

## 2023-01-23 MED ORDER — LOSARTAN POTASSIUM 50 MG PO TABS: 50.0000 mg | ORAL_TABLET | Freq: Every day | ORAL | 1 refills | Status: DC

## 2023-01-28 ENCOUNTER — Encounter: Payer: Self-pay | Attending: Internal Medicine | Admitting: *Deleted

## 2023-01-28 DIAGNOSIS — Z951 Presence of aortocoronary bypass graft: Secondary | ICD-10-CM | POA: Insufficient documentation

## 2023-01-28 NOTE — Progress Notes (Signed)
Daily Session Note  Patient Details  Name: Alexander Simon MRN: [id number] Date of Birth: February 18, 1971 Referring Provider:   Flowsheet Row Cardiac Rehab from 01/09/2023 in [facility] Cardiac and Pulmonary Rehab  Referring Provider Dr. Cristal Deer End       Encounter Date: 01/28/2023  Check In:  Session Check In - 01/28/23 1005       Check-In   Supervising physician immediately available to respond to emergencies See telemetry face sheet for immediately available ER MD    Location [facility]-Cardiac & Pulmonary Rehab    Staff Present Rory Percy, MS, Exercise Physiologist;Kelly Madilyn Fireman, BS, ACSM CEP, Exercise Physiologist;Laureen Manson Passey, BS, RRT, CPFT;Marquavis Hannen Katrinka Blazing, RN, ADN    Virtual Visit No    Medication changes reported     No    Fall or balance concerns reported    No    Warm-up and Cool-down Performed on first and last piece of equipment    Resistance Training Performed Yes    VAD Patient? No    PAD/SET Patient? No      Pain Assessment   Currently in Pain? No/denies                Social History   Tobacco Use  Smoking Status Never  Smokeless Tobacco Never    Goals Met:  Independence with exercise equipment Exercise tolerated well No report of concerns or symptoms today Strength training completed today  Goals Unmet:  Not Applicable  Comments: Pt able to follow exercise prescription today without complaint.  Will continue to monitor for progression.    Dr. Bethann Punches is Medical Director for [facility] Cardiac Rehabilitation.  Dr. Vida Rigger is Medical Director for [facility] Pulmonary Rehabilitation.

## 2023-01-29 ENCOUNTER — Telehealth: Payer: Self-pay | Admitting: Internal Medicine

## 2023-01-29 NOTE — Telephone Encounter (Signed)
Patient noted he has an echocardiogram test on 12/18.  Patient stated he is due to return to work on 12/16 but is still in cardiac rehab.  Patient wants to send in paperwork to be completed, signed and sent to his Short Term Disability Service clearing him to return to work.

## 2023-01-29 NOTE — Telephone Encounter (Signed)
Spoke with the patient, who stated that his employer faxed the STD forms to our office for review. The nurse informed the patient that we have not received the forms yet but will continue to be on the lookout.

## 2023-01-30 ENCOUNTER — Telehealth: Payer: Self-pay | Admitting: Internal Medicine

## 2023-01-30 ENCOUNTER — Encounter: Payer: Self-pay | Admitting: *Deleted

## 2023-01-30 DIAGNOSIS — Z951 Presence of aortocoronary bypass graft: Secondary | ICD-10-CM

## 2023-01-30 NOTE — Progress Notes (Signed)
Daily Session Note  Patient Details  Name: Alexander Simon MRN: [id number] Date of Birth: 07-13-1970 Referring Provider:   Flowsheet Row Cardiac Rehab from 01/09/2023 in [facility] Cardiac and Pulmonary Rehab  Referring Provider Dr. Cristal Deer End       Encounter Date: 01/30/2023  Check In:  Session Check In - 01/30/23 0948       Check-In   Supervising physician immediately available to respond to emergencies See telemetry face sheet for immediately available ER MD    Location [facility]-Cardiac & Pulmonary Rehab    Staff Present Rory Percy, MS, Exercise Physiologist;Joseph Reino Kent, RCP,RRT,BSRT;Maxon Clara BS, , Exercise Physiologist;Jeryl Umholtz Katrinka Blazing, RN, ADN    Virtual Visit No    Medication changes reported     No    Fall or balance concerns reported    No    Warm-up and Cool-down Performed on first and last piece of equipment    Resistance Training Performed Yes    VAD Patient? No    PAD/SET Patient? No      Pain Assessment   Currently in Pain? No/denies                Social History   Tobacco Use  Smoking Status Never  Smokeless Tobacco Never    Goals Met:  Independence with exercise equipment Exercise tolerated well No report of concerns or symptoms today Strength training completed today  Goals Unmet:  Not Applicable  Comments: Pt able to follow exercise prescription today without complaint.  Will continue to monitor for progression.    Dr. Bethann Punches is Medical Director for [facility] Cardiac Rehabilitation.  Dr. Vida Rigger is Medical Director for [facility] Pulmonary Rehabilitation.

## 2023-01-30 NOTE — Telephone Encounter (Signed)
Called patient to advise we received fax from [employer]-Disability forms for patient. Patient aware of  $29 fee and will come in tomorrow to complete forms.

## 2023-01-31 DIAGNOSIS — Z0279 Encounter for issue of other medical certificate: Secondary | ICD-10-CM

## 2023-01-31 NOTE — Telephone Encounter (Signed)
Patient completed [employer] forms. Paid $29 cash and billing sheet faxed to billing. Forms  placed in Dr. Serita Kyle RN box.

## 2023-02-04 ENCOUNTER — Encounter: Payer: Self-pay | Admitting: *Deleted

## 2023-02-05 NOTE — Telephone Encounter (Signed)
Forms placed on provider's desk for review

## 2023-02-06 ENCOUNTER — Encounter: Payer: Self-pay | Admitting: *Deleted

## 2023-02-06 NOTE — Telephone Encounter (Signed)
Completed forms faxed to [facility] and scanned into chart. Called patient-left voicemail to inform him forms were faxed to [facility] and ready to be picked up. Completed forms placed in front.

## 2023-02-06 NOTE — Telephone Encounter (Signed)
Forms returned to [facility] for processing.

## 2023-02-11 ENCOUNTER — Encounter: Payer: Self-pay | Admitting: *Deleted

## 2023-02-13 ENCOUNTER — Encounter: Payer: Self-pay | Admitting: *Deleted

## 2023-02-13 ENCOUNTER — Ambulatory Visit: Payer: Self-pay | Attending: Medical

## 2023-02-13 DIAGNOSIS — Z951 Presence of aortocoronary bypass graft: Secondary | ICD-10-CM

## 2023-02-13 DIAGNOSIS — I5020 Unspecified systolic (congestive) heart failure: Secondary | ICD-10-CM

## 2023-02-13 LAB — ECHOCARDIOGRAM LIMITED
Area-P 1/2: 3.92 cm2
S' Lateral: 4.1 cm
Single Plane A4C EF: 52.7 %

## 2023-02-13 NOTE — Progress Notes (Signed)
Cardiac Individual Treatment Plan  Patient Details  Name: Alexander Simon MRN: [id number] Date of Birth: May 13, 1970 Referring Provider:   Flowsheet Row Cardiac Rehab from 01/09/2023 in [facility] Cardiac and Pulmonary Rehab  Referring Provider Dr. Cristal Deer End       Initial Encounter Date:  Flowsheet Row Cardiac Rehab from 01/09/2023 in [facility] Cardiac and Pulmonary Rehab  Date 01/09/23       Visit Diagnosis: S/P CABG x 3  Patient's Home Medications on Admission:  Current Outpatient Medications:    amLODipine (NORVASC) 5 MG tablet, Take 1 tablet (5 mg total) by mouth daily., Disp: 90 tablet, Rfl: 1   aspirin EC 81 MG tablet, Take 1 tablet (81 mg total) by mouth daily. Swallow whole., Disp: , Rfl:    atorvastatin (LIPITOR) 80 MG tablet, Take 1 tablet (80 mg total) by mouth daily at 6 PM., Disp: 90 tablet, Rfl: 1   carvedilol (COREG) 12.5 MG tablet, Take 1 tablet (12.5 mg total) by mouth 2 (two) times daily with a meal., Disp: 180 tablet, Rfl: 1   clopidogrel (PLAVIX) 75 MG tablet, Take 1 tablet (75 mg total) by mouth daily., Disp: 90 tablet, Rfl: 1   empagliflozin (JARDIANCE) 10 MG TABS tablet, Take 1 tablet (10 mg total) by mouth daily., Disp: 90 tablet, Rfl: 1   losartan (COZAAR) 50 MG tablet, Take 1 tablet (50 mg total) by mouth daily., Disp: 90 tablet, Rfl: 1   nitroGLYCERIN (NITROSTAT) 0.4 MG SL tablet, Place 1 tablet (0.4 mg total) under the tongue every 5 (five) minutes as needed for chest pain., Disp: 25 tablet, Rfl: 3   potassium chloride SA (KLOR-CON M) 20 MEQ tablet, Take 1 tablet (20 mEq total) by mouth daily., Disp: 5 tablet, Rfl: 0   traZODone (DESYREL) 50 MG tablet, Take 0.5-1 tablets (25-50 mg total) by mouth at bedtime as needed for sleep., Disp: 30 tablet, Rfl: 2  Past Medical History: Past Medical History:  Diagnosis Date   GERD (gastroesophageal reflux disease)    no meds   Hypertension    No pertinent past medical history     Tobacco Use: Social History    Tobacco Use  Smoking Status Never  Smokeless Tobacco Never    Labs: Review Flowsheet  More data exists      Latest Ref Rng & Units 11/12/2022 11/13/2022 11/18/2022 11/19/2022 01/14/2023  Labs for ITP Cardiac and Pulmonary Rehab  Cholestrol 100 - 199 mg/dL - 409  - - 811   LDL (calc) 0 - 99 mg/dL - 914  - - 50   HDL-C >78 mg/dL - 35  - - 40   Trlycerides 0 - 149 mg/dL - 295  - - 75   Hemoglobin A1c 4.8 - 5.6 % 6.2  - - - -  PH, Arterial 7.35 - 7.45 - - 7.39  7.336  7.311  7.283  7.312  7.415  7.304  -  PCO2 arterial 32 - 48 mmHg - - 43  43.5  36.4  44.1  46.6  41.3  45.3  -  Bicarbonate 20.0 - 28.0 mmol/L - - 26.0  23.0  18.3  20.9  23.6  26.5  22.5  -  TCO2 22 - 32 mmol/L - - - 24  19  22  23  25  26  27  28  23  24  22   -  Acid-base deficit 0.0 - 2.0 mmol/L - - - 2.0  7.0  6.0  3.0  4.0  -  O2 Saturation % - - 98.1  95  95  96  93  100  99  -    Details       Multiple values from one day are sorted in reverse-chronological order          Exercise Target Goals: Exercise Program Goal: Individual exercise prescription set using results from initial 6 min walk test and THRR while considering  patient's activity barriers and safety.   Exercise Prescription Goal: Initial exercise prescription builds to 30-45 minutes a day of aerobic activity, 2-3 days per week.  Home exercise guidelines will be given to patient during program as part of exercise prescription that the participant will acknowledge.   Education: Aerobic Exercise: - Group verbal and visual presentation on the components of exercise prescription. Introduces F.I.T.T principle from ACSM for exercise prescriptions.  Reviews F.I.T.T. principles of aerobic exercise including progression. Written material given at graduation.   Education: Resistance Exercise: - Group verbal and visual presentation on the components of exercise prescription. Introduces F.I.T.T principle from ACSM for exercise prescriptions  Reviews  F.I.T.T. principles of resistance exercise including progression. Written material given at graduation.    Education: Exercise & Equipment Safety: - Individual verbal instruction and demonstration of equipment use and safety with use of the equipment. Flowsheet Row Cardiac Rehab from 01/16/2023 in [facility] Cardiac and Pulmonary Rehab  Date 01/09/23  Educator [facility]  Instruction Review Code 1- Verbalizes Understanding       Education: Exercise Physiology & General Exercise Guidelines: - Group verbal and written instruction with models to review the exercise physiology of the cardiovascular system and associated critical values. Provides general exercise guidelines with specific guidelines to those with heart or lung disease.  Flowsheet Row Cardiac Rehab from 01/16/2023 in [facility] Cardiac and Pulmonary Rehab  Education need identified 01/09/23       Education: Flexibility, Balance, Mind/Body Relaxation: - Group verbal and visual presentation with interactive activity on the components of exercise prescription. Introduces F.I.T.T principle from ACSM for exercise prescriptions. Reviews F.I.T.T. principles of flexibility and balance exercise training including progression. Also discusses the mind body connection.  Reviews various relaxation techniques to help reduce and manage stress (i.e. Deep breathing, progressive muscle relaxation, and visualization). Balance handout provided to take home. Written material given at graduation.   Activity Barriers & Risk Stratification:  Activity Barriers & Cardiac Risk Stratification - 01/09/23 1612       Activity Barriers & Cardiac Risk Stratification   Cardiac Risk Stratification High             6 Minute Walk:  6 Minute Walk     Row Name 01/09/23 1611         6 Minute Walk   Phase Initial     Distance 1385 feet     Walk Time 6 minutes     # of Rest Breaks 0     MPH 2.6     METS 3.64     RPE 12     Perceived Dyspnea  1     VO2 Peak 12.75      Symptoms No     Resting HR 87 bpm     Resting BP 124/62     Resting Oxygen Saturation  97 %     Exercise Oxygen Saturation  during 6 min walk 98 %     Max Ex. HR 113 bpm     Max Ex. BP 182/66     2 Minute Post BP 136/60  Oxygen Initial Assessment:   Oxygen Re-Evaluation:   Oxygen Discharge (Final Oxygen Re-Evaluation):   Initial Exercise Prescription:  Initial Exercise Prescription - 01/09/23 1600       Date of Initial Exercise RX and Referring Provider   Date 01/09/23    Referring Provider Dr. Cristal Deer End      Oxygen   Oxygen Continuous    Maintain Oxygen Saturation 88% or higher      Treadmill   MPH 2.5    Grade 0    Minutes 15    METs 2.91      REL-XR   Level 2    Speed 50    Minutes 15    METs 3.64      Intensity   THRR 40-80% of Max Heartrate 119-151    Ratings of Perceived Exertion 11-13    Perceived Dyspnea 0-4      Progression   Progression Continue to progress workloads to maintain intensity without signs/symptoms of physical distress.      Resistance Training   Training Prescription Yes    Weight 5 lb    Reps 10-15             Perform Capillary Blood Glucose checks as needed.  Exercise Prescription Changes:   Exercise Prescription Changes     Row Name 01/09/23 1600 01/30/23 0800 02/13/23 0700         Response to Exercise   Blood Pressure (Admit) 124/62 146/80 134/70     Blood Pressure (Exercise) 182/66 180/98 160/82     Blood Pressure (Exit) 136/60 124/72 112/62     Heart Rate (Admit) 87 bpm 81 bpm 83 bpm     Heart Rate (Exercise) 113 bpm 114 bpm 125 bpm     Heart Rate (Exit) 87 bpm 76 bpm 95 bpm     Oxygen Saturation (Admit) 97 % -- --     Oxygen Saturation (Exercise) 98 % -- --     Oxygen Saturation (Exit) 98 % -- --     Rating of Perceived Exertion (Exercise) 12 13 14      Perceived Dyspnea (Exercise) 1 -- --     Symptoms none none none     Comments results First two days of exercise --      Duration -- Continue with 30 min of aerobic exercise without signs/symptoms of physical distress. Continue with 30 min of aerobic exercise without signs/symptoms of physical distress.     Intensity -- THRR unchanged THRR unchanged       Progression   Progression -- Continue to progress workloads to maintain intensity without signs/symptoms of physical distress. Continue to progress workloads to maintain intensity without signs/symptoms of physical distress.     Average METs -- 2.68 2.69       Resistance Training   Training Prescription -- Yes Yes     Weight -- 5 lb 5 lb     Reps -- 10-15 10-15       Interval Training   Interval Training -- No No       Treadmill   MPH -- 1.8 2.1     Grade -- 0 0     Minutes -- 15 15     METs -- 2.38 2.61       Recumbant Bike   Level -- -- 6     Watts -- -- 40     Minutes -- -- 15     METs -- -- 2.94  REL-XR   Level -- 1 --     Minutes -- 15 --     METs -- 3 --       Oxygen   Maintain Oxygen Saturation -- 88% or higher 88% or higher              Exercise Comments:   Exercise Comments     Row Name 01/14/23 0937           Exercise Comments First full day of exercise!  Patient was oriented to gym and equipment including functions, settings, policies, and procedures.  Patient's individual exercise prescription and treatment plan were reviewed.  All starting workloads were established based on the results of the 6 minute walk test done at initial orientation visit.  The plan for exercise progression was also introduced and progression will be customized based on patient's performance and goals.                Exercise Goals and Review:   Exercise Goals     Row Name 01/09/23 1615             Exercise Goals   Increase Physical Activity Yes       Intervention Provide advice, education, support and counseling about physical activity/exercise needs.;Develop an individualized exercise prescription for aerobic and  resistive training based on initial evaluation findings, risk stratification, comorbidities and participant's personal goals.       Expected Outcomes Long Term: Exercising regularly at least 3-5 days a week.;Long Term: Add in home exercise to make exercise part of routine and to increase amount of physical activity.;Short Term: Attend rehab on a regular basis to increase amount of physical activity.       Increase Strength and Stamina Yes       Intervention Develop an individualized exercise prescription for aerobic and resistive training based on initial evaluation findings, risk stratification, comorbidities and participant's personal goals.;Provide advice, education, support and counseling about physical activity/exercise needs.       Expected Outcomes Long Term: Improve cardiorespiratory fitness, muscular endurance and strength as measured by increased METs and functional capacity ( );Short Term: Perform resistance training exercises routinely during rehab and add in resistance training at home;Short Term: Increase workloads from initial exercise prescription for resistance, speed, and METs.       Able to understand and use rate of perceived exertion (RPE) scale Yes       Intervention Provide education and explanation on how to use RPE scale       Expected Outcomes Long Term:  Able to use RPE to guide intensity level when exercising independently;Short Term: Able to use RPE daily in rehab to express subjective intensity level       Able to understand and use Dyspnea scale Yes       Intervention Provide education and explanation on how to use Dyspnea scale       Expected Outcomes Long Term: Able to use Dyspnea scale to guide intensity level when exercising independently;Short Term: Able to use Dyspnea scale daily in rehab to express subjective sense of shortness of breath during exertion       Knowledge and understanding of Target Heart Rate Range (THRR) Yes       Intervention Provide education and  explanation of THRR including how the numbers were predicted and where they are located for reference       Expected Outcomes Short Term: Able to use daily as guideline for intensity in rehab;Long Term: Able to  use THRR to govern intensity when exercising independently;Short Term: Able to state/look up THRR       Able to check pulse independently Yes       Intervention Review the importance of being able to check your own pulse for safety during independent exercise;Provide education and demonstration on how to check pulse in carotid and radial arteries.       Expected Outcomes Long Term: Able to check pulse independently and accurately;Short Term: Able to explain why pulse checking is important during independent exercise       Understanding of Exercise Prescription Yes       Intervention Provide education, explanation, and written materials on patient's individual exercise prescription       Expected Outcomes Long Term: Able to explain home exercise prescription to exercise independently;Short Term: Able to explain program exercise prescription                Exercise Goals Re-Evaluation :  Exercise Goals Re-Evaluation     Row Name 01/14/23 [phone] 01/30/23 0808 02/13/23 0736         Exercise Goal Re-Evaluation   Exercise Goals Review Able to understand and use rate of perceived exertion (RPE) scale;Able to understand and use Dyspnea scale;Knowledge and understanding of Target Heart Rate Range (THRR);Understanding of Exercise Prescription Increase Physical Activity;Increase Strength and Stamina;Understanding of Exercise Prescription Increase Physical Activity;Increase Strength and Stamina;Understanding of Exercise Prescription     Comments Reviewed RPE and dyspnea scale, THR and program prescription with pt today.  Pt voiced understanding and was given a copy of goals to take home. Nikkolai is off to a good start in the program. He has worked at level 1 on the XR and walked the treadmill at a  speed at 1.8 mph with no incline. He also has done well with 5 lb hand weights for resistance training. We will continue to monitor his progress in the program. Aydyn has only attended two sessions since the last review. He was able to increase his treadmill workload to a speed of 2.1 mph with no incline. He also improved to level 6 on the recumbent bike. We will continue to monitor his progress in the program.     Expected Outcomes Short: Use RPE daily to regulate intensity. Long: Follow program prescription in THR. Short: Continue to follow current exercise prescription. Long: Continue exercise to improve strength and stamina. Short: Attend rehab more consistently. Long: Continue exercise to improve strength and stamina.              Discharge Exercise Prescription (Final Exercise Prescription Changes):  Exercise Prescription Changes - 02/13/23 0700       Response to Exercise   Blood Pressure (Admit) 134/70    Blood Pressure (Exercise) 160/82    Blood Pressure (Exit) 112/62    Heart Rate (Admit) 83 bpm    Heart Rate (Exercise) 125 bpm    Heart Rate (Exit) 95 bpm    Rating of Perceived Exertion (Exercise) 14    Symptoms none    Duration Continue with 30 min of aerobic exercise without signs/symptoms of physical distress.    Intensity THRR unchanged      Progression   Progression Continue to progress workloads to maintain intensity without signs/symptoms of physical distress.    Average METs 2.69      Resistance Training   Training Prescription Yes    Weight 5 lb    Reps 10-15      Interval Training   Interval  Training No      Treadmill   MPH 2.1    Grade 0    Minutes 15    METs 2.61      Recumbant Bike   Level 6    Watts 40    Minutes 15    METs 2.94      Oxygen   Maintain Oxygen Saturation 88% or higher             Nutrition:  Target Goals: Understanding of nutrition guidelines, daily intake of sodium 1500mg , cholesterol 200mg , calories 30% from fat  and 7% or less from saturated fats, daily to have 5 or more servings of fruits and vegetables.  Education: All About Nutrition: -Group instruction provided by verbal, written material, interactive activities, discussions, models, and posters to present general guidelines for heart healthy nutrition including fat, fiber, MyPlate, the role of sodium in heart healthy nutrition, utilization of the nutrition label, and utilization of this knowledge for meal planning. Follow up email sent as well. Written material given at graduation. Flowsheet Row Cardiac Rehab from 01/16/2023 in [facility] Cardiac and Pulmonary Rehab  Education need identified 01/09/23       Biometrics:  Pre Biometrics - 01/09/23 1616       Pre Biometrics   Height 5' 10.71" (1.796 m)    Weight 296 lb 9.6 oz (134.5 kg)    Waist Circumference 50.1 inches    Hip Circumference 46.5 inches    Waist to Hip Ratio 1.08 %    BMI (Calculated) 41.71    Single Leg Stand 21.5 seconds              Nutrition Therapy Plan and Nutrition Goals:   Nutrition Assessments:  MEDIFICTS Score Key: >=70 Need to make dietary changes  40-70 Heart Healthy Diet <= 40 Therapeutic Level Cholesterol Diet  Flowsheet Row Cardiac Rehab from 01/09/2023 in [facility] Cardiac and Pulmonary Rehab  Picture Your Plate Total Score on Admission 67      Picture Your Plate Scores: <73 Unhealthy dietary pattern with much room for improvement. 41-50 Dietary pattern unlikely to meet recommendations for good health and room for improvement. 51-60 More healthful dietary pattern, with some room for improvement.  >60 Healthy dietary pattern, although there may be some specific behaviors that could be improved.    Nutrition Goals Re-Evaluation:   Nutrition Goals Discharge (Final Nutrition Goals Re-Evaluation):   Psychosocial: Target Goals: Acknowledge presence or absence of significant depression and/or stress, maximize coping skills, provide positive support  system. Participant is able to verbalize types and ability to use techniques and skills needed for reducing stress and depression.   Education: Stress, Anxiety, and Depression - Group verbal and visual presentation to define topics covered.  Reviews how body is impacted by stress, anxiety, and depression.  Also discusses healthy ways to reduce stress and to treat/manage anxiety and depression.  Written material given at graduation.   Education: Sleep Hygiene -Provides group verbal and written instruction about how sleep can affect your health.  Define sleep hygiene, discuss sleep cycles and impact of sleep habits. Review good sleep hygiene tips.    Initial Review & Psychosocial Screening:  Initial Psych Review & Screening - 12/31/22 1026       Initial Review   Current issues with None Identified;Current Stress Concerns      Family Dynamics   Good Support System? Yes    Comments Chidiebube states that he prays alot and can get through tough times. He lives alone  and does not have a great support system.      Barriers   Psychosocial barriers to participate in program The patient should benefit from training in stress management and relaxation.;There are no identifiable barriers or psychosocial needs.      Screening Interventions   Interventions To provide support and resources with identified psychosocial needs;Provide feedback about the scores to participant;Encouraged to exercise    Expected Outcomes Short Term goal: Utilizing psychosocial counselor, staff and physician to assist with identification of specific Stressors or current issues interfering with healing process. Setting desired goal for each stressor or current issue identified.;Long Term Goal: Stressors or current issues are controlled or eliminated.;Short Term goal: Identification and review with participant of any Quality of Life or Depression concerns found by scoring the questionnaire.;Long Term goal: The participant improves  quality of Life and PHQ9 Scores as seen by post scores and/or verbalization of changes             Quality of Life Scores:   Quality of Life - 01/09/23 1618       Quality of Life   Select Quality of Life      Quality of Life Scores   Health/Function Pre 24.4 %    Socioeconomic Pre 20.25 %    Psych/Spiritual Pre 29.14 %    Family Pre 21 %    GLOBAL Pre 23.91 %            Scores of 19 and below usually indicate a poorer quality of life in these areas.  A difference of  2-3 points is a clinically meaningful difference.  A difference of 2-3 points in the total score of the Quality of Life Index has been associated with significant improvement in overall quality of life, self-image, physical symptoms, and general health in studies assessing change in quality of life.  PHQ-9: Review Flowsheet  More data may exist      01/09/2023 01/07/2023 05/04/2019 05/02/2018 02/20/2018  Depression screen PHQ 2/9  Decreased Interest 0 0 0 0 0  Down, Depressed, Hopeless 0 0 0 0 0  PHQ - 2 Score 0 0 0 0 0  Altered sleeping 1 3 - - -  Tired, decreased energy 1 3 - - -  Change in appetite 0 0 - - -  Feeling bad or failure about yourself  0 0 - - -  Trouble concentrating 0 0 - - -  Moving slowly or fidgety/restless 0 0 - - -  Suicidal thoughts 0 0 - - -  PHQ-9 Score 2 6 - - -  Difficult doing work/chores Not difficult at all Not difficult at all - - -   Interpretation of Total Score  Total Score Depression Severity:  1-4 = Minimal depression, 5-9 = Mild depression, 10-14 = Moderate depression, 15-19 = Moderately severe depression, 20-27 = Severe depression   Psychosocial Evaluation and Intervention:  Psychosocial Evaluation - 12/31/22 1027       Psychosocial Evaluation & Interventions   Interventions Relaxation education;Stress management education;Encouraged to exercise with the program and follow exercise prescription    Comments Viraaj states that he prays alot and can get through  tough times. He lives alone and does not have a great support system.    Expected Outcomes Short: Start [facility] to help with mood. Long: Maintain a healthy mental state    Continue Psychosocial Services  Follow up required by staff             Psychosocial Re-Evaluation:  Psychosocial Discharge (Final Psychosocial Re-Evaluation):   Vocational Rehabilitation: Provide vocational rehab assistance to qualifying candidates.   Vocational Rehab Evaluation & Intervention:   Education: Education Goals: Education classes will be provided on a variety of topics geared toward better understanding of heart health and risk factor modification. Participant will state understanding/return demonstration of topics presented as noted by education test scores.  Learning Barriers/Preferences:  Learning Barriers/Preferences - 12/31/22 1026       Learning Barriers/Preferences   Learning Barriers None    Learning Preferences None             General Cardiac Education Topics:  AED/CPR: - Group verbal and written instruction with the use of models to demonstrate the basic use of the AED with the basic ABC's of resuscitation.   Anatomy and Cardiac Procedures: - Group verbal and visual presentation and models provide information about basic cardiac anatomy and function. Reviews the testing methods done to diagnose heart disease and the outcomes of the test results. Describes the treatment choices: Medical Management, Angioplasty, or Coronary Bypass Surgery for treating various heart conditions including Myocardial Infarction, Angina, Valve Disease, and Cardiac Arrhythmias.  Written material given at graduation. Flowsheet Row Cardiac Rehab from 01/16/2023 in [facility] Cardiac and Pulmonary Rehab  Education need identified 01/09/23  Date 01/16/23  Educator SB  Instruction Review Code 1- Verbalizes Understanding       Medication Safety: - Group verbal and visual instruction to review  commonly prescribed medications for heart and lung disease. Reviews the medication, class of the drug, and side effects. Includes the steps to properly store meds and maintain the prescription regimen.  Written material given at graduation.   Intimacy: - Group verbal instruction through game format to discuss how heart and lung disease can affect sexual intimacy. Written material given at graduation..   Know Your Numbers and Heart Failure: - Group verbal and visual instruction to discuss disease risk factors for cardiac and pulmonary disease and treatment options.  Reviews associated critical values for Overweight/Obesity, Hypertension, Cholesterol, and Diabetes.  Discusses basics of heart failure: signs/symptoms and treatments.  Introduces Heart Failure Zone chart for action plan for heart failure.  Written material given at graduation. Flowsheet Row Cardiac Rehab from 01/16/2023 in [facility] Cardiac and Pulmonary Rehab  Education need identified 01/09/23       Infection Prevention: - Provides verbal and written material to individual with discussion of infection control including proper hand washing and proper equipment cleaning during exercise session. Flowsheet Row Cardiac Rehab from 01/16/2023 in [facility] Cardiac and Pulmonary Rehab  Date 01/09/23  Educator [facility]  Instruction Review Code 1- Verbalizes Understanding       Falls Prevention: - Provides verbal and written material to individual with discussion of falls prevention and safety. Flowsheet Row Cardiac Rehab from 01/16/2023 in [facility] Cardiac and Pulmonary Rehab  Date 01/09/23  Educator [facility]  Instruction Review Code 1- Verbalizes Understanding       Other: -Provides group and verbal instruction on various topics (see comments)   Knowledge Questionnaire Score:  Knowledge Questionnaire Score - 01/09/23 1618       Knowledge Questionnaire Score   Pre Score 19/26             Core Components/Risk Factors/Patient Goals at  Admission:  Personal Goals and Risk Factors at Admission - 01/09/23 1619       Core Components/Risk Factors/Patient Goals on Admission    Weight Management Yes;Weight Loss;Obesity    Intervention Weight Management: Develop a combined  nutrition and exercise program designed to reach desired caloric intake, while maintaining appropriate intake of nutrient and fiber, sodium and fats, and appropriate energy expenditure required for the weight goal.;Weight Management: Provide education and appropriate resources to help participant work on and attain dietary goals.;Weight Management/Obesity: Establish reasonable short term and long term weight goals.;Obesity: Provide education and appropriate resources to help participant work on and attain dietary goals.    Admit Weight 296 lb 9.6 oz (134.5 kg)    Goal Weight: Short Term 290 lb (131.5 kg)    Goal Weight: Long Term 250 lb (113.4 kg)    Expected Outcomes Weight Loss: Understanding of general recommendations for a balanced deficit meal plan, which promotes 1-2 lb weight loss per week and includes a negative energy balance of [phone] kcal/d;Short Term: Continue to assess and modify interventions until short term weight is achieved;Long Term: Adherence to nutrition and physical activity/exercise program aimed toward attainment of established weight goal;Understanding recommendations for meals to include 15-35% energy as protein, 25-35% energy from fat, 35-60% energy from carbohydrates, less than 200mg  of dietary cholesterol, 20-35 gm of total fiber daily;Understanding of distribution of calorie intake throughout the day with the consumption of 4-5 meals/snacks    Hypertension Yes    Intervention Provide education on lifestyle modifcations including regular physical activity/exercise, weight management, moderate sodium restriction and increased consumption of fresh fruit, vegetables, and low fat dairy, alcohol moderation, and smoking cessation.;Monitor  prescription use compliance.    Expected Outcomes Short Term: Continued assessment and intervention until BP is < 140/80mm HG in hypertensive participants. < 130/11mm HG in hypertensive participants with diabetes, heart failure or chronic kidney disease.;Long Term: Maintenance of blood pressure at goal levels.    Lipids Yes    Intervention Provide education and support for participant on nutrition & aerobic/resistive exercise along with prescribed medications to achieve LDL 70mg , HDL >40mg .    Expected Outcomes Short Term: Participant states understanding of desired cholesterol values and is compliant with medications prescribed. Participant is following exercise prescription and nutrition guidelines.;Long Term: Cholesterol controlled with medications as prescribed, with individualized exercise RX and with personalized nutrition plan. Value goals: LDL < 70mg , HDL > 40 mg.             Education:Diabetes - Individual verbal and written instruction to review signs/symptoms of diabetes, desired ranges of glucose level fasting, after meals and with exercise. Acknowledge that pre and post exercise glucose checks will be done for 3 sessions at entry of program.   Core Components/Risk Factors/Patient Goals Review:    Core Components/Risk Factors/Patient Goals at Discharge (Final Review):    ITP Comments:  ITP Comments     Row Name 12/31/22 1025 01/09/23 1610 01/14/23 0937 01/16/23 0956 02/13/23 1159   ITP Comments Virtual Visit completed. Patient informed on EP and RD appointment and 6 Minute walk test. Patient also informed of patient health questionnaires on My Chart. Patient Verbalizes understanding. Visit diagnosis can be found in [facility] 11/30/2022. Completed and gym orientation. Initial ITP created and sent for review to Dr. Bethann Punches, Medical Director. First full day of exercise!  Patient was oriented to gym and equipment including functions, settings, policies, and procedures.  Patient's  individual exercise prescription and treatment plan were reviewed.  All starting workloads were established based on the results of the 6 minute walk test done at initial orientation visit.  The plan for exercise progression was also introduced and progression will be customized based on patient's performance and goals. 30  Day review completed. Medical Director ITP review done, changes made as directed, and signed approval by Medical Director.    new to program 30 Day review completed. Medical Director ITP review done, changes made as directed, and signed approval by Medical Director.            Comments:

## 2023-02-18 ENCOUNTER — Encounter: Payer: Self-pay | Admitting: *Deleted

## 2023-02-22 ENCOUNTER — Encounter: Payer: Self-pay | Admitting: Internal Medicine

## 2023-02-22 NOTE — Telephone Encounter (Signed)
I spoke with Mr. Tuomi by phone.  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  I have asked him to save the attachment sent to his email from his employer onto his computer and to attach it to a MyChart message sent to me.  If he has difficulty doing this, I asked him to message [place] again through MyChart so that we can make arrangements for an alternative method to receive his paperwork.  He is agreeable with the plan.  Yvonne Kendall, MD [facility]

## 2023-02-25 ENCOUNTER — Encounter: Payer: Self-pay | Admitting: *Deleted

## 2023-02-25 ENCOUNTER — Telehealth: Payer: Self-pay | Admitting: *Deleted

## 2023-02-25 ENCOUNTER — Telehealth: Payer: Self-pay | Admitting: Internal Medicine

## 2023-02-25 DIAGNOSIS — Z951 Presence of aortocoronary bypass graft: Secondary | ICD-10-CM

## 2023-02-25 NOTE — Progress Notes (Signed)
Cardiac Individual Treatment Plan  Patient Details  Name: Alexander Simon MRN: [id number] Date of Birth: [birth year] Referring Provider:   Flowsheet Row Cardiac Rehab from 01/09/2023 in [facility] Cardiac and Pulmonary Rehab  Referring Provider Dr. Cristal Deer End       Initial Encounter Date:  Flowsheet Row Cardiac Rehab from 01/09/2023 in [facility] Cardiac and Pulmonary Rehab  Date 01/09/23       Visit Diagnosis: S/P CABG x 3  Patient's Home Medications on Admission:  Current Outpatient Medications:    amLODipine (NORVASC) 5 MG tablet, Take 1 tablet (5 mg total) by mouth daily., Disp: 90 tablet, Rfl: 1   aspirin EC 81 MG tablet, Take 1 tablet (81 mg total) by mouth daily. Swallow whole., Disp: , Rfl:    atorvastatin (LIPITOR) 80 MG tablet, Take 1 tablet (80 mg total) by mouth daily at 6 PM., Disp: 90 tablet, Rfl: 1   carvedilol (COREG) 12.5 MG tablet, Take 1 tablet (12.5 mg total) by mouth 2 (two) times daily with a meal., Disp: 180 tablet, Rfl: 1   clopidogrel (PLAVIX) 75 MG tablet, Take 1 tablet (75 mg total) by mouth daily., Disp: 90 tablet, Rfl: 1   empagliflozin (JARDIANCE) 10 MG TABS tablet, Take 1 tablet (10 mg total) by mouth daily., Disp: 90 tablet, Rfl: 1   losartan (COZAAR) 50 MG tablet, Take 1 tablet (50 mg total) by mouth daily., Disp: 90 tablet, Rfl: 1   nitroGLYCERIN (NITROSTAT) 0.4 MG SL tablet, Place 1 tablet (0.4 mg total) under the tongue every 5 (five) minutes as needed for chest pain., Disp: 25 tablet, Rfl: 3   potassium chloride SA (KLOR-CON M) 20 MEQ tablet, Take 1 tablet (20 mEq total) by mouth daily., Disp: 5 tablet, Rfl: 0   traZODone (DESYREL) 50 MG tablet, Take 0.5-1 tablets (25-50 mg total) by mouth at bedtime as needed for sleep., Disp: 30 tablet, Rfl: 2  Past Medical History: Past Medical History:  Diagnosis Date   GERD (gastroesophageal reflux disease)    no meds   Hypertension    No pertinent past medical history     Tobacco Use: Social History    Tobacco Use  Smoking Status Never  Smokeless Tobacco Never    Labs: Review Flowsheet  More data exists      Latest Ref Rng & Units 11/12/2022 11/13/2022 11/18/2022 11/19/2022 01/14/2023  Labs for ITP Cardiac and Pulmonary Rehab  Cholestrol 100 - 199 mg/dL - 409  - - 811   LDL (calc) 0 - 99 mg/dL - 914  - - 50   HDL-C >78 mg/dL - 35  - - 40   Trlycerides 0 - 149 mg/dL - 295  - - 75   Hemoglobin A1c 4.8 - 5.6 % 6.2  - - - -  PH, Arterial 7.35 - 7.45 - - 7.39  7.336  7.311  7.283  7.312  7.415  7.304  -  PCO2 arterial 32 - 48 mmHg - - 43  43.5  36.4  44.1  46.6  41.3  45.3  -  Bicarbonate 20.0 - 28.0 mmol/L - - 26.0  23.0  18.3  20.9  23.6  26.5  22.5  -  TCO2 22 - 32 mmol/L - - - 24  19  22  23  25  26  27  28  23  24  22   -  Acid-base deficit 0.0 - 2.0 mmol/L - - - 2.0  7.0  6.0  3.0  4.0  -  O2 Saturation % - - 98.1  95  95  96  93  100  99  -    Details       Multiple values from one day are sorted in reverse-chronological order          Exercise Target Goals: Exercise Program Goal: Individual exercise prescription set using results from initial 6 min walk test and THRR while considering  patient's activity barriers and safety.   Exercise Prescription Goal: Initial exercise prescription builds to 30-45 minutes a day of aerobic activity, 2-3 days per week.  Home exercise guidelines will be given to patient during program as part of exercise prescription that the participant will acknowledge.   Education: Aerobic Exercise: - Group verbal and visual presentation on the components of exercise prescription. Introduces F.I.T.T principle from ACSM for exercise prescriptions.  Reviews F.I.T.T. principles of aerobic exercise including progression. Written material given at graduation.   Education: Resistance Exercise: - Group verbal and visual presentation on the components of exercise prescription. Introduces F.I.T.T principle from ACSM for exercise prescriptions  Reviews  F.I.T.T. principles of resistance exercise including progression. Written material given at graduation.    Education: Exercise & Equipment Safety: - Individual verbal instruction and demonstration of equipment use and safety with use of the equipment. Flowsheet Row Cardiac Rehab from 01/16/2023 in [facility] Cardiac and Pulmonary Rehab  Date 01/09/23  Educator [facility]  Instruction Review Code 1- Verbalizes Understanding       Education: Exercise Physiology & General Exercise Guidelines: - Group verbal and written instruction with models to review the exercise physiology of the cardiovascular system and associated critical values. Provides general exercise guidelines with specific guidelines to those with heart or lung disease.  Flowsheet Row Cardiac Rehab from 01/16/2023 in [facility] Cardiac and Pulmonary Rehab  Education need identified 01/09/23       Education: Flexibility, Balance, Mind/Body Relaxation: - Group verbal and visual presentation with interactive activity on the components of exercise prescription. Introduces F.I.T.T principle from ACSM for exercise prescriptions. Reviews F.I.T.T. principles of flexibility and balance exercise training including progression. Also discusses the mind body connection.  Reviews various relaxation techniques to help reduce and manage stress (i.e. Deep breathing, progressive muscle relaxation, and visualization). Balance handout provided to take home. Written material given at graduation.   Activity Barriers & Risk Stratification:  Activity Barriers & Cardiac Risk Stratification - 01/09/23 1612       Activity Barriers & Cardiac Risk Stratification   Cardiac Risk Stratification High             6 Minute Walk:  6 Minute Walk     Row Name 01/09/23 1611         6 Minute Walk   Phase Initial     Distance 1385 feet     Walk Time 6 minutes     # of Rest Breaks 0     MPH 2.6     METS 3.64     RPE 12     Perceived Dyspnea  1     VO2 Peak 12.75      Symptoms No     Resting HR 87 bpm     Resting BP 124/62     Resting Oxygen Saturation  97 %     Exercise Oxygen Saturation  during 6 min walk 98 %     Max Ex. HR 113 bpm     Max Ex. BP 182/66     2 Minute Post BP 136/60  Oxygen Initial Assessment:   Oxygen Re-Evaluation:   Oxygen Discharge (Final Oxygen Re-Evaluation):   Initial Exercise Prescription:  Initial Exercise Prescription - 01/09/23 1600       Date of Initial Exercise RX and Referring Provider   Date 01/09/23    Referring Provider Dr. Cristal Deer End      Oxygen   Oxygen Continuous    Maintain Oxygen Saturation 88% or higher      Treadmill   MPH 2.5    Grade 0    Minutes 15    METs 2.91      REL-XR   Level 2    Speed 50    Minutes 15    METs 3.64      Intensity   THRR 40-80% of Max Heartrate 119-151    Ratings of Perceived Exertion 11-13    Perceived Dyspnea 0-4      Progression   Progression Continue to progress workloads to maintain intensity without signs/symptoms of physical distress.      Resistance Training   Training Prescription Yes    Weight 5 lb    Reps 10-15             Perform Capillary Blood Glucose checks as needed.  Exercise Prescription Changes:   Exercise Prescription Changes     Row Name 01/09/23 1600 01/30/23 0800 02/13/23 0700         Response to Exercise   Blood Pressure (Admit) 124/62 146/80 134/70     Blood Pressure (Exercise) 182/66 180/98 160/82     Blood Pressure (Exit) 136/60 124/72 112/62     Heart Rate (Admit) 87 bpm 81 bpm 83 bpm     Heart Rate (Exercise) 113 bpm 114 bpm 125 bpm     Heart Rate (Exit) 87 bpm 76 bpm 95 bpm     Oxygen Saturation (Admit) 97 % -- --     Oxygen Saturation (Exercise) 98 % -- --     Oxygen Saturation (Exit) 98 % -- --     Rating of Perceived Exertion (Exercise) 12 13 14      Perceived Dyspnea (Exercise) 1 -- --     Symptoms none none none     Comments results First two days of exercise --      Duration -- Continue with 30 min of aerobic exercise without signs/symptoms of physical distress. Continue with 30 min of aerobic exercise without signs/symptoms of physical distress.     Intensity -- THRR unchanged THRR unchanged       Progression   Progression -- Continue to progress workloads to maintain intensity without signs/symptoms of physical distress. Continue to progress workloads to maintain intensity without signs/symptoms of physical distress.     Average METs -- 2.68 2.69       Resistance Training   Training Prescription -- Yes Yes     Weight -- 5 lb 5 lb     Reps -- 10-15 10-15       Interval Training   Interval Training -- No No       Treadmill   MPH -- 1.8 2.1     Grade -- 0 0     Minutes -- 15 15     METs -- 2.38 2.61       Recumbant Bike   Level -- -- 6     Watts -- -- 40     Minutes -- -- 15     METs -- -- 2.94  REL-XR   Level -- 1 --     Minutes -- 15 --     METs -- 3 --       Oxygen   Maintain Oxygen Saturation -- 88% or higher 88% or higher              Exercise Comments:   Exercise Comments     Row Name 01/14/23 0937           Exercise Comments First full day of exercise!  Patient was oriented to gym and equipment including functions, settings, policies, and procedures.  Patient's individual exercise prescription and treatment plan were reviewed.  All starting workloads were established based on the results of the 6 minute walk test done at initial orientation visit.  The plan for exercise progression was also introduced and progression will be customized based on patient's performance and goals.                Exercise Goals and Review:   Exercise Goals     Row Name 01/09/23 1615             Exercise Goals   Increase Physical Activity Yes       Intervention Provide advice, education, support and counseling about physical activity/exercise needs.;Develop an individualized exercise prescription for aerobic and  resistive training based on initial evaluation findings, risk stratification, comorbidities and participant's personal goals.       Expected Outcomes Long Term: Exercising regularly at least 3-5 days a week.;Long Term: Add in home exercise to make exercise part of routine and to increase amount of physical activity.;Short Term: Attend rehab on a regular basis to increase amount of physical activity.       Increase Strength and Stamina Yes       Intervention Develop an individualized exercise prescription for aerobic and resistive training based on initial evaluation findings, risk stratification, comorbidities and participant's personal goals.;Provide advice, education, support and counseling about physical activity/exercise needs.       Expected Outcomes Long Term: Improve cardiorespiratory fitness, muscular endurance and strength as measured by increased METs and functional capacity ( );Short Term: Perform resistance training exercises routinely during rehab and add in resistance training at home;Short Term: Increase workloads from initial exercise prescription for resistance, speed, and METs.       Able to understand and use rate of perceived exertion (RPE) scale Yes       Intervention Provide education and explanation on how to use RPE scale       Expected Outcomes Long Term:  Able to use RPE to guide intensity level when exercising independently;Short Term: Able to use RPE daily in rehab to express subjective intensity level       Able to understand and use Dyspnea scale Yes       Intervention Provide education and explanation on how to use Dyspnea scale       Expected Outcomes Long Term: Able to use Dyspnea scale to guide intensity level when exercising independently;Short Term: Able to use Dyspnea scale daily in rehab to express subjective sense of shortness of breath during exertion       Knowledge and understanding of Target Heart Rate Range (THRR) Yes       Intervention Provide education and  explanation of THRR including how the numbers were predicted and where they are located for reference       Expected Outcomes Short Term: Able to use daily as guideline for intensity in rehab;Long Term: Able to  use THRR to govern intensity when exercising independently;Short Term: Able to state/look up THRR       Able to check pulse independently Yes       Intervention Review the importance of being able to check your own pulse for safety during independent exercise;Provide education and demonstration on how to check pulse in carotid and radial arteries.       Expected Outcomes Long Term: Able to check pulse independently and accurately;Short Term: Able to explain why pulse checking is important during independent exercise       Understanding of Exercise Prescription Yes       Intervention Provide education, explanation, and written materials on patient's individual exercise prescription       Expected Outcomes Long Term: Able to explain home exercise prescription to exercise independently;Short Term: Able to explain program exercise prescription                Exercise Goals Re-Evaluation :  Exercise Goals Re-Evaluation     Row Name 01/14/23 [phone] 01/30/23 0808 02/13/23 0736         Exercise Goal Re-Evaluation   Exercise Goals Review Able to understand and use rate of perceived exertion (RPE) scale;Able to understand and use Dyspnea scale;Knowledge and understanding of Target Heart Rate Range (THRR);Understanding of Exercise Prescription Increase Physical Activity;Increase Strength and Stamina;Understanding of Exercise Prescription Increase Physical Activity;Increase Strength and Stamina;Understanding of Exercise Prescription     Comments Reviewed RPE and dyspnea scale, THR and program prescription with pt today.  Pt voiced understanding and was given a copy of goals to take home. Alexander Simon is off to a good start in the program. He has worked at level 1 on the XR and walked the treadmill at a  speed at 1.8 mph with no incline. He also has done well with 5 lb hand weights for resistance training. We will continue to monitor his progress in the program. Alexander Simon has only attended two sessions since the last review. He was able to increase his treadmill workload to a speed of 2.1 mph with no incline. He also improved to level 6 on the recumbent bike. We will continue to monitor his progress in the program.     Expected Outcomes Short: Use RPE daily to regulate intensity. Long: Follow program prescription in THR. Short: Continue to follow current exercise prescription. Long: Continue exercise to improve strength and stamina. Short: Attend rehab more consistently. Long: Continue exercise to improve strength and stamina.              Discharge Exercise Prescription (Final Exercise Prescription Changes):  Exercise Prescription Changes - 02/13/23 0700       Response to Exercise   Blood Pressure (Admit) 134/70    Blood Pressure (Exercise) 160/82    Blood Pressure (Exit) 112/62    Heart Rate (Admit) 83 bpm    Heart Rate (Exercise) 125 bpm    Heart Rate (Exit) 95 bpm    Rating of Perceived Exertion (Exercise) 14    Symptoms none    Duration Continue with 30 min of aerobic exercise without signs/symptoms of physical distress.    Intensity THRR unchanged      Progression   Progression Continue to progress workloads to maintain intensity without signs/symptoms of physical distress.    Average METs 2.69      Resistance Training   Training Prescription Yes    Weight 5 lb    Reps 10-15      Interval Training   Interval  Training No      Treadmill   MPH 2.1    Grade 0    Minutes 15    METs 2.61      Recumbant Bike   Level 6    Watts 40    Minutes 15    METs 2.94      Oxygen   Maintain Oxygen Saturation 88% or higher             Nutrition:  Target Goals: Understanding of nutrition guidelines, daily intake of sodium 1500mg , cholesterol 200mg , calories 30% from fat  and 7% or less from saturated fats, daily to have 5 or more servings of fruits and vegetables.  Education: All About Nutrition: -Group instruction provided by verbal, written material, interactive activities, discussions, models, and posters to present general guidelines for heart healthy nutrition including fat, fiber, MyPlate, the role of sodium in heart healthy nutrition, utilization of the nutrition label, and utilization of this knowledge for meal planning. Follow up email sent as well. Written material given at graduation. Flowsheet Row Cardiac Rehab from 01/16/2023 in [facility] Cardiac and Pulmonary Rehab  Education need identified 01/09/23       Biometrics:  Pre Biometrics - 01/09/23 1616       Pre Biometrics   Height 5' 10.71" (1.796 m)    Weight 296 lb 9.6 oz (134.5 kg)    Waist Circumference 50.1 inches    Hip Circumference 46.5 inches    Waist to Hip Ratio 1.08 %    BMI (Calculated) 41.71    Single Leg Stand 21.5 seconds              Nutrition Therapy Plan and Nutrition Goals:   Nutrition Assessments:  MEDIFICTS Score Key: >=70 Need to make dietary changes  40-70 Heart Healthy Diet <= 40 Therapeutic Level Cholesterol Diet  Flowsheet Row Cardiac Rehab from 01/09/2023 in [facility] Cardiac and Pulmonary Rehab  Picture Your Plate Total Score on Admission 67      Picture Your Plate Scores: <19 Unhealthy dietary pattern with much room for improvement. 41-50 Dietary pattern unlikely to meet recommendations for good health and room for improvement. 51-60 More healthful dietary pattern, with some room for improvement.  >60 Healthy dietary pattern, although there may be some specific behaviors that could be improved.    Nutrition Goals Re-Evaluation:   Nutrition Goals Discharge (Final Nutrition Goals Re-Evaluation):   Psychosocial: Target Goals: Acknowledge presence or absence of significant depression and/or stress, maximize coping skills, provide positive support  system. Participant is able to verbalize types and ability to use techniques and skills needed for reducing stress and depression.   Education: Stress, Anxiety, and Depression - Group verbal and visual presentation to define topics covered.  Reviews how body is impacted by stress, anxiety, and depression.  Also discusses healthy ways to reduce stress and to treat/manage anxiety and depression.  Written material given at graduation.   Education: Sleep Hygiene -Provides group verbal and written instruction about how sleep can affect your health.  Define sleep hygiene, discuss sleep cycles and impact of sleep habits. Review good sleep hygiene tips.    Initial Review & Psychosocial Screening:  Initial Psych Review & Screening - 12/31/22 1026       Initial Review   Current issues with None Identified;Current Stress Concerns      Family Dynamics   Good Support System? Yes    Comments Jemar states that he prays alot and can get through tough times. He lives alone  and does not have a great support system.      Barriers   Psychosocial barriers to participate in program The patient should benefit from training in stress management and relaxation.;There are no identifiable barriers or psychosocial needs.      Screening Interventions   Interventions To provide support and resources with identified psychosocial needs;Provide feedback about the scores to participant;Encouraged to exercise    Expected Outcomes Short Term goal: Utilizing psychosocial counselor, staff and physician to assist with identification of specific Stressors or current issues interfering with healing process. Setting desired goal for each stressor or current issue identified.;Long Term Goal: Stressors or current issues are controlled or eliminated.;Short Term goal: Identification and review with participant of any Quality of Life or Depression concerns found by scoring the questionnaire.;Long Term goal: The participant improves  quality of Life and PHQ9 Scores as seen by post scores and/or verbalization of changes             Quality of Life Scores:   Quality of Life - 01/09/23 1618       Quality of Life   Select Quality of Life      Quality of Life Scores   Health/Function Pre 24.4 %    Socioeconomic Pre 20.25 %    Psych/Spiritual Pre 29.14 %    Family Pre 21 %    GLOBAL Pre 23.91 %            Scores of 19 and below usually indicate a poorer quality of life in these areas.  A difference of  2-3 points is a clinically meaningful difference.  A difference of 2-3 points in the total score of the Quality of Life Index has been associated with significant improvement in overall quality of life, self-image, physical symptoms, and general health in studies assessing change in quality of life.  PHQ-9: Review Flowsheet  More data may exist      01/09/2023 01/07/2023 05/04/2019 05/02/2018 02/20/2018  Depression screen PHQ 2/9  Decreased Interest 0 0 0 0 0  Down, Depressed, Hopeless 0 0 0 0 0  PHQ - 2 Score 0 0 0 0 0  Altered sleeping 1 3 - - -  Tired, decreased energy 1 3 - - -  Change in appetite 0 0 - - -  Feeling bad or failure about yourself  0 0 - - -  Trouble concentrating 0 0 - - -  Moving slowly or fidgety/restless 0 0 - - -  Suicidal thoughts 0 0 - - -  PHQ-9 Score 2 6 - - -  Difficult doing work/chores Not difficult at all Not difficult at all - - -   Interpretation of Total Score  Total Score Depression Severity:  1-4 = Minimal depression, 5-9 = Mild depression, 10-14 = Moderate depression, 15-19 = Moderately severe depression, 20-27 = Severe depression   Psychosocial Evaluation and Intervention:  Psychosocial Evaluation - 12/31/22 1027       Psychosocial Evaluation & Interventions   Interventions Relaxation education;Stress management education;Encouraged to exercise with the program and follow exercise prescription    Comments Alexander Simon states that he prays alot and can get through  tough times. He lives alone and does not have a great support system.    Expected Outcomes Short: Start [facility] to help with mood. Long: Maintain a healthy mental state    Continue Psychosocial Services  Follow up required by staff             Psychosocial Re-Evaluation:  Psychosocial Discharge (Final Psychosocial Re-Evaluation):   Vocational Rehabilitation: Provide vocational rehab assistance to qualifying candidates.   Vocational Rehab Evaluation & Intervention:   Education: Education Goals: Education classes will be provided on a variety of topics geared toward better understanding of heart health and risk factor modification. Participant will state understanding/return demonstration of topics presented as noted by education test scores.  Learning Barriers/Preferences:  Learning Barriers/Preferences - 12/31/22 1026       Learning Barriers/Preferences   Learning Barriers None    Learning Preferences None             General Cardiac Education Topics:  AED/CPR: - Group verbal and written instruction with the use of models to demonstrate the basic use of the AED with the basic ABC's of resuscitation.   Anatomy and Cardiac Procedures: - Group verbal and visual presentation and models provide information about basic cardiac anatomy and function. Reviews the testing methods done to diagnose heart disease and the outcomes of the test results. Describes the treatment choices: Medical Management, Angioplasty, or Coronary Bypass Surgery for treating various heart conditions including Myocardial Infarction, Angina, Valve Disease, and Cardiac Arrhythmias.  Written material given at graduation. Flowsheet Row Cardiac Rehab from 01/16/2023 in [facility] Cardiac and Pulmonary Rehab  Education need identified 01/09/23  Date 01/16/23  Educator SB  Instruction Review Code 1- Verbalizes Understanding       Medication Safety: - Group verbal and visual instruction to review  commonly prescribed medications for heart and lung disease. Reviews the medication, class of the drug, and side effects. Includes the steps to properly store meds and maintain the prescription regimen.  Written material given at graduation.   Intimacy: - Group verbal instruction through game format to discuss how heart and lung disease can affect sexual intimacy. Written material given at graduation..   Know Your Numbers and Heart Failure: - Group verbal and visual instruction to discuss disease risk factors for cardiac and pulmonary disease and treatment options.  Reviews associated critical values for Overweight/Obesity, Hypertension, Cholesterol, and Diabetes.  Discusses basics of heart failure: signs/symptoms and treatments.  Introduces Heart Failure Zone chart for action plan for heart failure.  Written material given at graduation. Flowsheet Row Cardiac Rehab from 01/16/2023 in [facility] Cardiac and Pulmonary Rehab  Education need identified 01/09/23       Infection Prevention: - Provides verbal and written material to individual with discussion of infection control including proper hand washing and proper equipment cleaning during exercise session. Flowsheet Row Cardiac Rehab from 01/16/2023 in [facility] Cardiac and Pulmonary Rehab  Date 01/09/23  Educator [facility]  Instruction Review Code 1- Verbalizes Understanding       Falls Prevention: - Provides verbal and written material to individual with discussion of falls prevention and safety. Flowsheet Row Cardiac Rehab from 01/16/2023 in [facility] Cardiac and Pulmonary Rehab  Date 01/09/23  Educator [facility]  Instruction Review Code 1- Verbalizes Understanding       Other: -Provides group and verbal instruction on various topics (see comments)   Knowledge Questionnaire Score:  Knowledge Questionnaire Score - 01/09/23 1618       Knowledge Questionnaire Score   Pre Score 19/26             Core Components/Risk Factors/Patient Goals at  Admission:  Personal Goals and Risk Factors at Admission - 01/09/23 1619       Core Components/Risk Factors/Patient Goals on Admission    Weight Management Yes;Weight Loss;Obesity    Intervention Weight Management: Develop a combined  nutrition and exercise program designed to reach desired caloric intake, while maintaining appropriate intake of nutrient and fiber, sodium and fats, and appropriate energy expenditure required for the weight goal.;Weight Management: Provide education and appropriate resources to help participant work on and attain dietary goals.;Weight Management/Obesity: Establish reasonable short term and long term weight goals.;Obesity: Provide education and appropriate resources to help participant work on and attain dietary goals.    Admit Weight 296 lb 9.6 oz (134.5 kg)    Goal Weight: Short Term 290 lb (131.5 kg)    Goal Weight: Long Term 250 lb (113.4 kg)    Expected Outcomes Weight Loss: Understanding of general recommendations for a balanced deficit meal plan, which promotes 1-2 lb weight loss per week and includes a negative energy balance of [phone] kcal/d;Short Term: Continue to assess and modify interventions until short term weight is achieved;Long Term: Adherence to nutrition and physical activity/exercise program aimed toward attainment of established weight goal;Understanding recommendations for meals to include 15-35% energy as protein, 25-35% energy from fat, 35-60% energy from carbohydrates, less than 200mg  of dietary cholesterol, 20-35 gm of total fiber daily;Understanding of distribution of calorie intake throughout the day with the consumption of 4-5 meals/snacks    Hypertension Yes    Intervention Provide education on lifestyle modifcations including regular physical activity/exercise, weight management, moderate sodium restriction and increased consumption of fresh fruit, vegetables, and low fat dairy, alcohol moderation, and smoking cessation.;Monitor  prescription use compliance.    Expected Outcomes Short Term: Continued assessment and intervention until BP is < 140/62mm HG in hypertensive participants. < 130/19mm HG in hypertensive participants with diabetes, heart failure or chronic kidney disease.;Long Term: Maintenance of blood pressure at goal levels.    Lipids Yes    Intervention Provide education and support for participant on nutrition & aerobic/resistive exercise along with prescribed medications to achieve LDL 70mg , HDL >40mg .    Expected Outcomes Short Term: Participant states understanding of desired cholesterol values and is compliant with medications prescribed. Participant is following exercise prescription and nutrition guidelines.;Long Term: Cholesterol controlled with medications as prescribed, with individualized exercise RX and with personalized nutrition plan. Value goals: LDL < 70mg , HDL > 40 mg.             Education:Diabetes - Individual verbal and written instruction to review signs/symptoms of diabetes, desired ranges of glucose level fasting, after meals and with exercise. Acknowledge that pre and post exercise glucose checks will be done for 3 sessions at entry of program.   Core Components/Risk Factors/Patient Goals Review:    Core Components/Risk Factors/Patient Goals at Discharge (Final Review):    ITP Comments:  ITP Comments     Row Name 12/31/22 1025 01/09/23 1610 01/14/23 0937 01/16/23 0956 02/13/23 1159   ITP Comments Virtual Visit completed. Patient informed on EP and RD appointment and 6 Minute walk test. Patient also informed of patient health questionnaires on My Chart. Patient Verbalizes understanding. Visit diagnosis can be found in [facility] 11/30/2022. Completed and gym orientation. Initial ITP created and sent for review to Dr. Bethann Punches, Medical Director. First full day of exercise!  Patient was oriented to gym and equipment including functions, settings, policies, and procedures.  Patient's  individual exercise prescription and treatment plan were reviewed.  All starting workloads were established based on the results of the 6 minute walk test done at initial orientation visit.  The plan for exercise progression was also introduced and progression will be customized based on patient's performance and goals. 30  Day review completed. Medical Director ITP review done, changes made as directed, and signed approval by Medical Director.    new to program 30 Day review completed. Medical Director ITP review done, changes made as directed, and signed approval by Medical Director.    Row Name 02/25/23 1516           ITP Comments Called to check on [place]. He has not been to rehab since 12/4. He said that he is now back to work and unable to attend rehab. He agrees to discharge early from the program.                Comments: Discharge ITP

## 2023-02-25 NOTE — Telephone Encounter (Signed)
Patient dropped off additional forms for Dr. Okey Dupre to sign. Placed in Pam's box.

## 2023-02-25 NOTE — Telephone Encounter (Signed)
Forms pending drop off by patient.

## 2023-02-25 NOTE — Progress Notes (Addendum)
Discharge Summary:  Alexander Simon  (DOB: 08/08/1970)  Bas[place] Levels discharged early from cardiac rehab due to going back to work and unable to continue class. He completed 6/36 sessions.    6 Minute Walk     Row Name 01/09/23 1611         6 Minute Walk   Phase Initial     Distance 1385 feet     Walk Time 6 minutes     # of Rest Breaks 0     MPH 2.6     METS 3.64     RPE 12     Perceived Dyspnea  1     VO2 Peak 12.75     Symptoms No     Resting HR 87 bpm     Resting BP 124/62     Resting Oxygen Saturation  97 %     Exercise Oxygen Saturation  during 6 min walk 98 %     Max Ex. HR 113 bpm     Max Ex. BP 182/66     2 Minute Post BP 136/60

## 2023-02-25 NOTE — Telephone Encounter (Signed)
Patient stated he will be bringing forms to the office today for Dr. Okey Dupre to sign regarding having his lite duty extended.

## 2023-02-25 NOTE — Telephone Encounter (Signed)
Called to check on Alexander Simon. He has not been to rehab since 12/4. He said that he is now back to work and unable to attend rehab. He agrees to discharge early from the program.

## 2023-02-26 NOTE — Telephone Encounter (Signed)
Forms placed on Dr. Darnelle Bos desk for review and completion.

## 2023-03-01 NOTE — Telephone Encounter (Signed)
 Forms given to Alexander Simon in front office

## 2023-03-01 NOTE — Telephone Encounter (Signed)
 Left voicemail forms are signed & complete placed at front desk to be picked up.

## 2023-03-07 ENCOUNTER — Telehealth: Payer: Self-pay | Admitting: Internal Medicine

## 2023-03-07 NOTE — Telephone Encounter (Signed)
 Pt states that he is being sent home from work today due to restrictions placed on his rtw note. He needs it to be adjusted to say that he can stand and walk up to 8 hrs a day They are accomodating him being able to sit throughout the day Fax to absent management team [phone] att michelle tracy Pt would like a copy

## 2023-03-07 NOTE — Telephone Encounter (Signed)
 Pt called in stating that it needs to faxed back to [facility] instead   Fax: [phone]

## 2023-03-08 NOTE — Telephone Encounter (Signed)
 Called and spoke with patient. Patient states that he will get the form and fax it to our office. Patient states that he needs the form to say that he can walk and stand for 8 hours.

## 2023-03-08 NOTE — Telephone Encounter (Signed)
 Pt called back stating to disregard, nothing further needed for job.

## 2023-03-21 NOTE — Progress Notes (Signed)
Cardiology Office Note:    Date:  03/22/2023   ID:  Alexander Simon, DOB [birth year], MRN [id number]  PCP:  Remo Lipps, PA  [facility] Cardiologist:  Yvonne Kendall, MD  [facility] Electrophysiologist:  None   Referring MD: Remo Lipps, PA   Chief Complaint: 3 month follow-up  History of Present Illness:    Alexander Simon is a 53 y.o. male with a hx of  Hypertension, obesity, CAD s/p CABG x 3, HFmrEF, ICM, prediabetes who is being seen for follow-up.    The patient presented to [facility] 11/12/22 with chest pain. HS troponin elevated to 700s. Echo showed LVEF 45-50%, moderate LVH, G2DD. Cardiac cath showed severe 3V CAD and he was transferred to [facility] for CABG consult. The patient underwent CABG x3 using LIMA to LAD, Radial artery to OM and SVG to diagonal on 9/23. Post-op patient required some IV diuresis. He was started on Plavix for NSTEMI. He was transitioned to [place], [place], and [place]. [name] was held for elevated K.    The patient was last seen 12/2022 and was overall doing well form a cardiac perspective. Limited echo was ordered. This showed LVEF 50-55%, moderate LVH, normal RVSF, mild MR.  Today, the patient is overall doing well. He has some MSK pain in his left chest. He has occasional panic attacks. They can occur at any time. He is not wanting to take anything for his panic attacks. He does take extra breaks at work. At work, he is always moving. He has not been checking BP at home. He is not eating low salt. He eats out a lot. He is doing other activity outside of work, he walks on the treadmill every day.   Past Medical History:  Diagnosis Date   GERD (gastroesophageal reflux disease)    no meds   Hypertension    No pertinent past medical history     Past Surgical History:  Procedure Laterality Date   CIRCUMCISION     CORONARY ARTERY BYPASS GRAFT N/A 11/19/2022   Procedure: CORONARY ARTERY BYPASS GRAFTING (CABG) X THREE  USING LEFT INTERNAL MAMMARY  ARTERY, LEFT RADIAL ARTERY, AND ENDOSCOPICALLY HARVESTED RIGHT GREATER SAPHENOUS VEIN;  Surgeon: Corliss Skains, MD;  Location: [facility] OR;  Service: Open Heart Surgery;  Laterality: N/A;   EXCISION MASS NECK Right 06/12/2019   Procedure: EXCISION Superficial MASS NECK;  Surgeon: Duanne Guess, MD;  Location: [facility] ORS;  Service: General;  Laterality: Right;   EXCISION OF SKIN TAG Bilateral 06/12/2019   Procedure: EXCISION OF SKIN TAG on neck;  Surgeon: Duanne Guess, MD;  Location: [facility] ORS;  Service: General;  Laterality: Bilateral;   LEFT HEART CATH AND CORONARY ANGIOGRAPHY N/A 11/13/2022   Procedure: LEFT HEART CATH AND CORONARY ANGIOGRAPHY;  Surgeon: Yvonne Kendall, MD;  Location: [facility] INVASIVE CV LAB;  Service: Cardiovascular;  Laterality: N/A;   RADIAL ARTERY HARVEST Left 11/19/2022   Procedure: LEFT RADIAL ARTERY HARVEST;  Surgeon: Corliss Skains, MD;  Location: [facility] OR;  Service: Open Heart Surgery;  Laterality: Left;   SHOULDER SURGERY Right 2014   TEE WITHOUT CARDIOVERSION N/A 11/19/2022   Procedure: TRANSESOPHAGEAL ECHOCARDIOGRAM;  Surgeon: Corliss Skains, MD;  Location: [facility] OR;  Service: Open Heart Surgery;  Laterality: N/A;    Current Medications: Current Meds  Medication Sig   amLODipine (NORVASC) 5 MG tablet Take 1 tablet (5 mg total) by mouth daily.   aspirin EC 81 MG tablet Take 1 tablet (81 mg total) by  mouth daily. Swallow whole.   atorvastatin (LIPITOR) 80 MG tablet Take 1 tablet (80 mg total) by mouth daily at 6 PM.   carvedilol (COREG) 12.5 MG tablet Take 1 tablet (12.5 mg total) by mouth 2 (two) times daily with a meal.   clopidogrel (PLAVIX) 75 MG tablet Take 1 tablet (75 mg total) by mouth daily.   empagliflozin (JARDIANCE) 10 MG TABS tablet Take 1 tablet (10 mg total) by mouth daily.   losartan (COZAAR) 50 MG tablet Take 1 tablet (50 mg total) by mouth daily.   spironolactone (ALDACTONE) 25 MG tablet Take 0.5 tablets (12.5 mg total) by mouth daily.    traZODone (DESYREL) 50 MG tablet Take 0.5-1 tablets (25-50 mg total) by mouth at bedtime as needed for sleep.   [DISCONTINUED] potassium chloride SA (KLOR-CON M) 20 MEQ tablet Take 1 tablet (20 mEq total) by mouth daily.     Allergies:   Patient has no known allergies.   Social History   Socioeconomic History   Marital status: Single    Spouse name: Not on file   Number of children: Not on file   Years of education: Not on file   Highest education level: Not on file  Occupational History   Not on file  Tobacco Use   Smoking status: Never   Smokeless tobacco: Never  Vaping Use   Vaping status: Never Used  Substance and Sexual Activity   Alcohol use: No   Drug use: Never   Sexual activity: Not Currently    Birth control/protection: None  Other Topics Concern   Not on file  Social History Narrative   Not on file   Social Drivers of Health   Financial Resource Strain: Not on file  Food Insecurity: No Food Insecurity (11/22/2022)   Hunger Vital Sign    Worried About Running Out of Food in the Last Year: Never true    Ran Out of Food in the Last Year: Never true  Transportation Needs: No Transportation Needs (11/22/2022)   PRAPARE - Administrator, Civil Service (Medical): No    Lack of Transportation (Non-Medical): No  Physical Activity: Not on file  Stress: Not on file  Social Connections: Not on file     Family History: The patient's family history includes Breast cancer in his mother; Cancer in his maternal uncle; Lung cancer in his mother.  ROS:   Please see the history of present illness.     All other systems reviewed and are negative.  EKGs/Labs/Other Studies Reviewed:    The following studies were reviewed today:  Limited echo 01/2023  1. Left ventricular ejection fraction, by estimation, is 50 to 55%. The  left ventricle has low normal function. The left ventricle has no regional  wall motion abnormalities. There is moderate left ventricular  hypertrophy.  Left ventricular diastolic  parameters were normal.   2. Right ventricular systolic function is normal. The right ventricular  size is normal.   3. The mitral valve is normal in structure. Mild mitral valve  regurgitation. No evidence of mitral stenosis.   4. The aortic valve has an indeterminant number of cusps. Aortic valve  regurgitation is not visualized. No aortic stenosis is present.   5. The inferior vena cava is normal in size with greater than 50%  respiratory variability, suggesting right atrial pressure of 3 mmHg.     LHC 10/2022 Conclusions: Multivessel CAD, including 50% ostial LMCA stenosis, mild luminal irregularities involving the LAD, 90% ostial  and mid/distal LCx lesions, and sequential 50-60% RCA stenoses. LVEDP 18 mmHg.   Recommendations: Transfer to [facility] for CABG evaluation.  Ostial LCx lesion would be very difficult to treat percutaneously due to angulation of the vessel relative to the LMCA and LAD.  Additionally, there is at least moderate, bordering on severe ostial LMCA and sequential mid/distal RCA disease. Resume heparin infusion 2 hours after TR band removal. Aggressive secondary prevention of CAD.   Yvonne Kendall, MD [facility] Coronary Diagrams   Diagnostic Dominance: Right      Echo 10/2022  1. Left ventricular ejection fraction, by estimation, is 45 to 50%. The  left ventricle has mildly decreased function. The left ventricle  demonstrates regional wall motion abnormalities (see scoring  diagram/findings for description). There is moderate  left ventricular hypertrophy. Left ventricular diastolic parameters are  consistent with Grade II diastolic dysfunction (pseudonormalization).  Elevated left atrial pressure. There is severe hypokinesis of the left  ventricular, basal-mid inferior wall and  inferolateral wall.   2. Right ventricular systolic function is normal. The right ventricular  size is normal. Tricuspid  regurgitation signal is inadequate for assessing  PA pressure.   3. The mitral valve is degenerative. Trivial mitral valve regurgitation.  No evidence of mitral stenosis.   4. The aortic valve was not well visualized. Aortic valve regurgitation  is not visualized. No aortic stenosis is present.     EKG:  EKG is ordered today.  The ekg ordered today demonstrates NSR 67bpm, TWI I, II, V2-V6  Recent Labs: 11/12/2022: ALT 34; TSH 1.991 11/20/2022: Magnesium 2.6 01/07/2023: BUN 10; Creatinine, Ser 1.06; Hemoglobin 10.8; Platelets 293; Potassium 4.5; Sodium 143  Recent Lipid Panel    Component Value Date/Time   CHOL 106 01/14/2023 1124   TRIG 75 01/14/2023 1124   HDL 40 01/14/2023 1124   CHOLHDL 2.7 01/14/2023 1124   CHOLHDL 5.1 11/13/2022 0503   VLDL 41 (H) 11/13/2022 0503   LDLCALC 50 01/14/2023 1124   Physical Exam:    VS:  BP (!) 162/78 (BP Location: Left Arm, Patient Position: Sitting, Cuff Size: Normal)   Pulse 67   Ht 6\' 1"  (1.854 m)   Wt (!) 301 lb 9.6 oz (136.8 kg)   SpO2 95%   BMI 39.79 kg/m     Wt Readings from Last 3 Encounters:  03/22/23 (!) 301 lb 9.6 oz (136.8 kg)  01/14/23 295 lb 9.6 oz (134.1 kg)  01/09/23 296 lb 9.6 oz (134.5 kg)     GEN:  Well nourished, well developed in no acute distress HEENT: Normal NECK: No JVD; No carotid bruits LYMPHATICS: No lymphadenopathy CARDIAC: RRR, no murmurs, rubs, gallops RESPIRATORY:  Clear to auscultation without rales, wheezing or rhonchi  ABDOMEN: Soft, non-tender, non-distended MUSCULOSKELETAL:  No edema; No deformity  SKIN: Warm and dry NEUROLOGIC:  Alert and oriented x 3 PSYCHIATRIC:  Normal affect   ASSESSMENT:    1. Coronary artery disease involving native coronary artery of native heart without angina pectoris   2. Chronic heart failure with mildly reduced ejection fraction (HFmrEF, 41-49%) (HCC)   3. Essential hypertension   4. Hyperlipidemia, mixed    PLAN:    In order of problems listed  above:  CAD s/p CABG x3 Patient is overall recovering well. He has MSK chest pain from using his arms at work. He is doing exercise outside of work. Continue ASA, Plavix, Lipitor, Coreg, and SL NTG. No further work-up at this time.   HFmrEF Prior echo  showed LVEF 45-50%. Repeat echo showed LVEF 50-55%. He has lower leg edema on exam. He did not tolerate spiro in the past due to hyperkalemia, but he was also taking oral potassium supplementation at the same time. I will stop potassium and start spiro 12.5mg  daily. BMET in 2 weeks. Continue Jardiance 10mg  daily, Losartan 50mg  daily and Coreg 12.5mg  BID.   HTN BP is elevated today. Recommended low salt diet and discussed other diet changes. Start spiro as above. Continue Coreg, Losartan and Jardiance.   HLD LDL 50. Continue Lipitor 80mg  daily.    Disposition: Follow up in 4 month(s) with MD/APP    Signed, Joselle Deeds David Stall, PA-C  03/22/2023 11:35 AM    [facility]

## 2023-03-22 ENCOUNTER — Encounter: Payer: Self-pay | Admitting: Medical

## 2023-03-22 ENCOUNTER — Ambulatory Visit: Payer: Commercial Managed Care - PPO | Attending: Medical | Admitting: Medical

## 2023-03-22 VITALS — BP 162/78 | HR 67 | Ht 73.0 in | Wt 301.6 lb

## 2023-03-22 DIAGNOSIS — I5022 Chronic systolic (congestive) heart failure: Secondary | ICD-10-CM

## 2023-03-22 DIAGNOSIS — E782 Mixed hyperlipidemia: Secondary | ICD-10-CM

## 2023-03-22 DIAGNOSIS — I10 Essential (primary) hypertension: Secondary | ICD-10-CM

## 2023-03-22 DIAGNOSIS — I2510 Atherosclerotic heart disease of native coronary artery without angina pectoris: Secondary | ICD-10-CM | POA: Diagnosis not present

## 2023-03-22 MED ORDER — SPIRONOLACTONE 25 MG PO TABS: 12.5000 mg | ORAL_TABLET | Freq: Every day | ORAL | 3 refills | Status: DC

## 2023-03-22 NOTE — Patient Instructions (Signed)
Medication Instructions:  Your physician recommends the following medication changes.  Your physician recommends the following medication changes.  STOP TAKING: Potassium   START TAKING: Spironolactone (1/2 tablet) 12.5 mg by mouth daily  *If you need a refill on your cardiac medications before your next appointment, please call your pharmacy*   Lab Work: Your provider would like for you to return in 2 week to have the following labs drawn: (BMP).   Please go to [facility] [address] ([facility], [place] [address] You do not need an appointment.  They are open from 8 am- 4:30 pm.  Lunch from 1:00 pm- 2:00 pm You will not need to be fasting.   You may also go to one of the following [facility]:  [address] [place], [place] [address] Phone: [phone] Lab hours: Mon-Fri 8 am- 5 pm    Lunch 12 pm- 1 pm  [address] [place],  [place]  [address]  [place] Phone: [phone] Lab hours: 7 am- 4 pm Lunch 12 pm-1 pm   [address] [place],  [place]  [address]  [place] Phone: [phone] Lab hours: Mon-Fri 8 am- 5 pm    Lunch 12 pm- 1 pm    Testing/Procedures: No test ordered today    Follow-Up: At [facility], you and your health needs are our priority.  As part of our continuing mission to provide you with exceptional heart care, we have created designated Provider Care Teams.  These Care Teams include your primary Cardiologist (physician) and Advanced Practice Providers (APPs -  Physician Assistants and Nurse Practitioners) who all work together to provide you with the care you need, when you need it.  We recommend signing up for the patient portal called "MyChart".  Sign up information is provided on this After Visit Summary.  MyChart is used to connect with patients for Virtual Visits (Telemedicine).  Patients are able to view lab/test results, encounter notes, upcoming appointments, etc.  Non-urgent messages can be sent to your  provider as well.   To learn more about what you can do with MyChart, go to [email or web address].    Your next appointment:   4 month(s)  Provider:   You may see Yvonne Kendall, MD or one of the following Advanced Practice Providers on your designated Care Team:   Nicolasa Ducking, NP Eula Listen, PA-C Cadence Fransico Michael, PA-C Charlsie Quest, NP Carlos Levering, NP

## 2023-03-22 NOTE — Addendum Note (Signed)
Addended by: Parke Poisson on: 03/22/2023 12:07 PM   Modules accepted: Orders

## 2023-03-25 NOTE — Telephone Encounter (Signed)
Samples of Entresto were never picked up by patient.   Entresto 24 mg/26 mg 1 bottle 28 tablets LOT# [id number] EXP# 2025-Aug  Returned unopened bottle back into our supply.

## 2023-04-17 ENCOUNTER — Other Ambulatory Visit: Payer: Self-pay | Admitting: Emergency Medicine

## 2023-04-17 DIAGNOSIS — E875 Hyperkalemia: Secondary | ICD-10-CM

## 2023-04-17 DIAGNOSIS — I10 Essential (primary) hypertension: Secondary | ICD-10-CM

## 2023-04-18 LAB — BASIC METABOLIC PANEL
BUN/Creatinine Ratio: 9 (ref 9–20)
BUN: 9 mg/dL (ref 6–24)
CO2: 24 mmol/L (ref 20–29)
Calcium: 9.2 mg/dL (ref 8.7–10.2)
Chloride: 101 mmol/L (ref 96–106)
Creatinine, Ser: 1 mg/dL (ref 0.76–1.27)
Glucose: 98 mg/dL (ref 70–99)
Potassium: 4.4 mmol/L (ref 3.5–5.2)
Sodium: 138 mmol/L (ref 134–144)
eGFR: 90 mL/min/{1.73_m2} (ref >59)

## 2023-04-25 ENCOUNTER — Telehealth: Payer: Self-pay | Admitting: Physician Assistant

## 2023-04-25 ENCOUNTER — Ambulatory Visit: Payer: Commercial Managed Care - PPO | Admitting: Physician Assistant

## 2023-04-25 ENCOUNTER — Encounter: Payer: Self-pay | Admitting: Physician Assistant

## 2023-04-25 VITALS — BP 124/76 | HR 76 | Ht 73.0 in | Wt 297.0 lb

## 2023-04-25 DIAGNOSIS — I2510 Atherosclerotic heart disease of native coronary artery without angina pectoris: Secondary | ICD-10-CM | POA: Diagnosis not present

## 2023-04-25 DIAGNOSIS — N529 Male erectile dysfunction, unspecified: Secondary | ICD-10-CM | POA: Diagnosis not present

## 2023-04-25 DIAGNOSIS — I10 Essential (primary) hypertension: Secondary | ICD-10-CM | POA: Diagnosis not present

## 2023-04-25 DIAGNOSIS — E66812 Obesity, class 2: Secondary | ICD-10-CM | POA: Diagnosis not present

## 2023-04-25 DIAGNOSIS — Z6839 Body mass index (BMI) 39.0-39.9, adult: Secondary | ICD-10-CM

## 2023-04-25 DIAGNOSIS — I252 Old myocardial infarction: Secondary | ICD-10-CM

## 2023-04-25 MED ORDER — SILDENAFIL CITRATE 100 MG PO TABS: 50.0000 mg | ORAL_TABLET | Freq: Every day | ORAL | Status: DC | PRN

## 2023-04-25 MED ORDER — WEGOVY 0.25 MG/0.5ML ~~LOC~~ SOAJ
0.2500 mg | SUBCUTANEOUS | 0 refills | Status: DC
Start: 2023-04-25 — End: 2023-05-31

## 2023-04-25 NOTE — Assessment & Plan Note (Signed)
 Patient advised probably okay to resume sildenafil, but cautioned on first dose hypotension and advised to take only 0.5 tablet at first.  We also discussed the potential for pronounced hypotension if sildenafil and nitroglycerin are used within 24 hours of each other.  Patient verbalizes understanding.  Will continue risk factor control, namely HTN and HLD.  Weight loss may also result in improved sexual performance.

## 2023-04-25 NOTE — Telephone Encounter (Signed)
 Initiating prior Auth for [facility].  Diagnosis is class 2 severe obesity with serious comorbidities including CAD, recent NSTEMI, HTN, HLD, metabolic syndrome.

## 2023-04-25 NOTE — Assessment & Plan Note (Signed)
 Continue all medications as prescribed.  Continue specialist follow-up.

## 2023-04-25 NOTE — Progress Notes (Signed)
 Date:  04/25/2023   Name:  Alexander Simon   DOB:  1970/07/04   MRN:  [id number]   Chief Complaint: Obesity  HPI Alexander Simon returns for 59-month f/u on chronic conditions and discussion on weight loss medications. In Sept 2024 he had NSTEMI and underwent triple bypass.  Since then he has been following routinely with cardiology, last visit  with them 03/22/2023 planning follow-up 07/19/2023.   He tells me that workplace restrictions have been released, and he intends to get back into the gym.  He is eager to lose weight, currently with significant central adiposity, ultimately wants to get his weight down to 255 lb.   He is interested in GLP-1 medication for weight loss given his significant comorbidities.  He eats out fairly regularly, reports one of his weaknesses is sweet tea but he does not have this every day.  Also wonders if it would be all right for him to restart sildenafil which has previously been prescribed to him.   Medication list has been reviewed and updated.  Current Meds  Medication Sig   amLODipine (NORVASC) 5 MG tablet Take 1 tablet (5 mg total) by mouth daily.   aspirin EC 81 MG tablet Take 1 tablet (81 mg total) by mouth daily. Swallow whole.   carvedilol (COREG) 12.5 MG tablet Take 1 tablet (12.5 mg total) by mouth 2 (two) times daily with a meal.   clopidogrel (PLAVIX) 75 MG tablet Take 1 tablet (75 mg total) by mouth daily.   empagliflozin (JARDIANCE) 10 MG TABS tablet Take 1 tablet (10 mg total) by mouth daily.   losartan (COZAAR) 50 MG tablet Take 1 tablet (50 mg total) by mouth daily.   Semaglutide-Weight Management (WEGOVY) 0.25 MG/0.5ML SOAJ Inject 0.25 mg into the skin once a week.   spironolactone (ALDACTONE) 25 MG tablet Take 0.5 tablets (12.5 mg total) by mouth daily.   traZODone (DESYREL) 50 MG tablet Take 0.5-1 tablets (25-50 mg total) by mouth at bedtime as needed for sleep.     Review of Systems  Patient Active Problem List   Diagnosis Date Noted    Vasculogenic erectile dysfunction 04/25/2023   Coronary artery disease involving native coronary artery of native heart without angina pectoris 04/25/2023   Insomnia 01/07/2023   Anemia 01/07/2023   S/P CABG x 3 11/20/2022   H/O open surgical procurement of radial artery 11/20/2022   Dyslipidemia 11/13/2022   History of non-ST elevation myocardial infarction (NSTEMI) 11/12/2022   Class 2 severe obesity with serious comorbidity and body mass index (BMI) of 39.0 to 39.9 in adult [facility]) 11/12/2022   Prediabetes 11/12/2022   Mass of right side of neck    Skin tags, multiple acquired    Hypertension 02/20/2018   Chest pain 03/09/2017    No Known Allergies  Immunization History  Administered Date(s) Administered   PFIZER(Purple Top)SARS-COV-2 Vaccination 11/10/2019    Past Surgical History:  Procedure Laterality Date   CIRCUMCISION     CORONARY ARTERY BYPASS GRAFT N/A 11/19/2022   Procedure: CORONARY ARTERY BYPASS GRAFTING (CABG) X THREE  USING LEFT INTERNAL MAMMARY ARTERY, LEFT RADIAL ARTERY, AND ENDOSCOPICALLY HARVESTED RIGHT GREATER SAPHENOUS VEIN;  Surgeon: Corliss Skains, MD;  Location: [facility];  Service: Open Heart Surgery;  Laterality: N/A;   EXCISION MASS NECK Right 06/12/2019   Procedure: EXCISION Superficial MASS NECK;  Surgeon: Duanne Guess, MD;  Location: [facility] ORS;  Service: General;  Laterality: Right;   EXCISION OF SKIN TAG Bilateral 06/12/2019  Procedure: EXCISION OF SKIN TAG on neck;  Surgeon: Duanne Guess, MD;  Location: [facility] ORS;  Service: General;  Laterality: Bilateral;   LEFT HEART CATH AND CORONARY ANGIOGRAPHY N/A 11/13/2022   Procedure: LEFT HEART CATH AND CORONARY ANGIOGRAPHY;  Surgeon: Yvonne Kendall, MD;  Location: [facility] INVASIVE CV LAB;  Service: Cardiovascular;  Laterality: N/A;   RADIAL ARTERY HARVEST Left 11/19/2022   Procedure: LEFT RADIAL ARTERY HARVEST;  Surgeon: Corliss Skains, MD;  Location: [facility] OR;  Service: Open Heart Surgery;   Laterality: Left;   SHOULDER SURGERY Right 2014   TEE WITHOUT CARDIOVERSION N/A 11/19/2022   Procedure: TRANSESOPHAGEAL ECHOCARDIOGRAM;  Surgeon: Corliss Skains, MD;  Location: [facility] OR;  Service: Open Heart Surgery;  Laterality: N/A;    Social History   Tobacco Use   Smoking status: Never   Smokeless tobacco: Never  Vaping Use   Vaping status: Never Used  Substance Use Topics   Alcohol use: No   Drug use: Never    Family History  Problem Relation Age of Onset   Breast cancer Mother    Lung cancer Mother    Cancer Maternal Uncle         04/25/2023   11:00 AM 01/07/2023    8:56 AM  GAD 7 : Generalized Anxiety Score  Nervous, Anxious, on Edge 0 0  Control/stop worrying 0 0  Worry too much - different things 0 0  Trouble relaxing 0 2  Restless 0 0  Easily annoyed or irritable 0 0  Afraid - awful might happen 0 0  Total GAD 7 Score 0 2  Anxiety Difficulty Not difficult at all Not difficult at all       04/25/2023   11:00 AM 01/09/2023    4:19 PM 01/07/2023    8:55 AM  Depression screen PHQ 2/9  Decreased Interest 0 0 0  Down, Depressed, Hopeless 0 0 0  PHQ - 2 Score 0 0 0  Altered sleeping 0 1 3  Tired, decreased energy 0 1 3  Change in appetite 0 0 0  Feeling bad or failure about yourself  0 0 0  Trouble concentrating 0 0 0  Moving slowly or fidgety/restless 0 0 0  Suicidal thoughts 0 0 0  PHQ-9 Score 0 2 6  Difficult doing work/chores Not difficult at all Not difficult at all Not difficult at all    BP Readings from Last 3 Encounters:  04/25/23 124/76  03/22/23 (!) 162/78  01/14/23 134/70    Wt Readings from Last 3 Encounters:  04/25/23 297 lb (134.7 kg)  03/22/23 (!) 301 lb 9.6 oz (136.8 kg)  01/14/23 295 lb 9.6 oz (134.1 kg)    BP 124/76   Pulse 76   Ht 6\' 1"  (1.854 m)   Wt 297 lb (134.7 kg)   SpO2 97%   BMI 39.18 kg/m   Physical Exam Vitals and nursing note reviewed.  Constitutional:      Appearance: Normal appearance.   Cardiovascular:     Rate and Rhythm: Normal rate.  Pulmonary:     Effort: Pulmonary effort is normal.  Abdominal:     General: There is no distension.  Musculoskeletal:        General: Normal range of motion.  Skin:    General: Skin is warm and dry.  Neurological:     Mental Status: He is alert and oriented to person, place, and time.     Gait: Gait is intact.  Psychiatric:  Mood and Affect: Mood and affect normal.     Recent Labs     Component Value Date/Time   NA 138 04/17/2023 0828   NA 138 09/19/2012 0003   K 4.4 04/17/2023 0828   K 3.9 09/19/2012 0003   CL 101 04/17/2023 0828   CL 105 09/19/2012 0003   CO2 24 04/17/2023 0828   CO2 30 09/19/2012 0003   GLUCOSE 98 04/17/2023 0828   GLUCOSE 105 (H) 11/25/2022 0316   GLUCOSE 111 (H) 09/19/2012 0003   BUN 9 04/17/2023 0828   BUN 14 09/19/2012 0003   CREATININE 1.00 04/17/2023 0828   CREATININE 1.00 09/19/2012 0003   CALCIUM 9.2 04/17/2023 0828   CALCIUM 8.4 (L) 09/19/2012 0003   PROT 7.9 11/12/2022 2000   PROT 7.5 05/05/2018 1047   ALBUMIN 4.0 11/12/2022 2000   ALBUMIN 4.5 05/05/2018 1047   AST 23 11/12/2022 2000   ALT 34 11/12/2022 2000   ALKPHOS 50 11/12/2022 2000   BILITOT 0.9 11/12/2022 2000   BILITOT <0.2 05/05/2018 1047   GFRNONAA >60 11/25/2022 0316   GFRNONAA >60 09/19/2012 0003   GFRAA 101 05/05/2018 1047   GFRAA >60 09/19/2012 0003    Lab Results  Component Value Date   WBC 5.7 01/07/2023   HGB 10.8 (L) 01/07/2023   HCT 35.4 (L) 01/07/2023   MCV 79 01/07/2023   PLT 293 01/07/2023   Lab Results  Component Value Date   HGBA1C 6.2 (H) 11/12/2022   Lab Results  Component Value Date   CHOL 106 01/14/2023   HDL 40 01/14/2023   LDLCALC 50 01/14/2023   TRIG 75 01/14/2023   CHOLHDL 2.7 01/14/2023   Lab Results  Component Value Date   TSH 1.991 11/12/2022     Assessment and Plan:  Class 2 severe obesity due to excess calories with serious comorbidity and body mass index (BMI) of  39.0 to 39.9 in adult [facility]) Assessment & Plan: Comorbidities include CAD, HLD, prediabetes, metabolic syndrome, and probable OSA.  Lengthy conversation with patient today regarding the use of GLP-1 RA's.  Ultimately decided to proceed with prescription for [facility] given his history of heart attack among other comorbidities.   Advised the importance of adequate protein intake on this medication as well as resistance training which will not only improve insulin sensitivity but also build muscle mass and reduce risk of atrophy.  Advised importance of portion control and slower eating on this medication to minimize GI side effects. Avoid high fat foods as these may cause discomfort. Patient aware of potential for weight regain when eventually stopping the medication. For this reason, continued efforts toward improving lifestyle will be particularly important moving forward.   Recommend diet low in simple carbs such as white starches (bread, pasta, rice) and refined sugar found in desserts and sweetened beverages including juice, sweet tea, and soda. Encouraged drinking mainly water, flavoring naturally with lemon, cucumber, berries etc or with artificial sweetener if needed for convenience, though excess artificial sweetener is not ideal either.   We discussed the value of good nutrition for energy, digestion, and metabolism. Encouraged consumption of whole fruits and vegetables as a staple in the diet, alongside well-balanced meals with healthy sources of protein - preferably plant-based. Modifying diet is a process, and any progress made should be viewed as a victory.   Recommend routine physical activity which can start small (10 min daily walk) with eventual goal of 150 min of heart rate elevation per week. Examples could include brisk  walk, stationary bike, etc. At least twice per week, I recommend some type of resistance training which could be weights, resistance bands, or body weight exercises - this  would count toward the weekly exercise goal.   Orders: -     ZOXWRU; Inject 0.25 mg into the skin once a week.  Dispense: 2 mL; Refill: 0  Coronary artery disease involving native coronary artery of native heart without angina pectoris Assessment & Plan: Continue all medications as prescribed.  Continue specialist follow-up.  Orders: -     Wegovy; Inject 0.25 mg into the skin once a week.  Dispense: 2 mL; Refill: 0  Vasculogenic erectile dysfunction, unspecified vasculogenic erectile dysfunction type Assessment & Plan: Patient advised probably okay to resume sildenafil, but cautioned on first dose hypotension and advised to take only 0.5 tablet at first.  We also discussed the potential for pronounced hypotension if sildenafil and nitroglycerin are used within 24 hours of each other.  Patient verbalizes understanding.  Will continue risk factor control, namely HTN and HLD.  Weight loss may also result in improved sexual performance.  Orders: -     Sildenafil Citrate; Take 0.5-1 tablets (50-100 mg total) by mouth daily as needed for erectile dysfunction.  Primary hypertension Assessment & Plan: Well-controlled today.  Continue all medications as prescribed.  Continue specialist follow-up.   History of non-ST elevation myocardial infarction (NSTEMI) Assessment & Plan: Continue all medications as prescribed.  Continue specialist follow-up.      Return in about 4 weeks (around 05/23/2023) for OV f/u weight.    Alvester Morin, PA-C, DMSc, Nutritionist [facility] Primary Care and Sports Medicine [facility] [phone]

## 2023-04-25 NOTE — Assessment & Plan Note (Signed)
 Well-controlled today.  Continue all medications as prescribed.  Continue specialist follow-up.

## 2023-04-25 NOTE — Assessment & Plan Note (Signed)
 Comorbidities include CAD, HLD, prediabetes, metabolic syndrome, and probable OSA.  Lengthy conversation with patient today regarding the use of GLP-1 RA's.  Ultimately decided to proceed with prescription for [facility] given his history of heart attack among other comorbidities.   Advised the importance of adequate protein intake on this medication as well as resistance training which will not only improve insulin sensitivity but also build muscle mass and reduce risk of atrophy.  Advised importance of portion control and slower eating on this medication to minimize GI side effects. Avoid high fat foods as these may cause discomfort. Patient aware of potential for weight regain when eventually stopping the medication. For this reason, continued efforts toward improving lifestyle will be particularly important moving forward.   Recommend diet low in simple carbs such as white starches (bread, pasta, rice) and refined sugar found in desserts and sweetened beverages including juice, sweet tea, and soda. Encouraged drinking mainly water, flavoring naturally with lemon, cucumber, berries etc or with artificial sweetener if needed for convenience, though excess artificial sweetener is not ideal either.   We discussed the value of good nutrition for energy, digestion, and metabolism. Encouraged consumption of whole fruits and vegetables as a staple in the diet, alongside well-balanced meals with healthy sources of protein - preferably plant-based. Modifying diet is a process, and any progress made should be viewed as a victory.   Recommend routine physical activity which can start small (10 min daily walk) with eventual goal of 150 min of heart rate elevation per week. Examples could include brisk walk, stationary bike, etc. At least twice per week, I recommend some type of resistance training which could be weights, resistance bands, or body weight exercises - this would count toward the weekly exercise goal.

## 2023-04-26 ENCOUNTER — Telehealth: Payer: Self-pay

## 2023-04-26 NOTE — Telephone Encounter (Signed)
 PA completed  KP

## 2023-04-26 NOTE — Telephone Encounter (Signed)
 PA completed waiting on insurance approval.  Key: [id number]  KP

## 2023-04-26 NOTE — Telephone Encounter (Signed)
 Your PA request has been approved. Additional information will be provided in the approval communication. (Message 1145). Authorization Expiration Date: November 24, 2023.  KP

## 2023-05-30 ENCOUNTER — Other Ambulatory Visit: Payer: Self-pay

## 2023-05-30 ENCOUNTER — Other Ambulatory Visit: Payer: Self-pay | Admitting: Physician Assistant

## 2023-05-30 DIAGNOSIS — N529 Male erectile dysfunction, unspecified: Secondary | ICD-10-CM

## 2023-05-30 MED ORDER — SILDENAFIL CITRATE 100 MG PO TABS
50.0000 mg | ORAL_TABLET | Freq: Every day | ORAL | 5 refills | Status: DC | PRN
  Filled 2023-05-30: qty 10, 30d supply, fill #0

## 2023-05-31 ENCOUNTER — Encounter: Payer: Self-pay | Admitting: Physician Assistant

## 2023-05-31 ENCOUNTER — Ambulatory Visit: Admitting: Physician Assistant

## 2023-05-31 VITALS — BP 124/82 | HR 89 | Temp 98.2°F | Ht 73.0 in | Wt 287.8 lb

## 2023-05-31 DIAGNOSIS — E66812 Obesity, class 2: Secondary | ICD-10-CM

## 2023-05-31 DIAGNOSIS — Z6837 Body mass index (BMI) 37.0-37.9, adult: Secondary | ICD-10-CM

## 2023-05-31 DIAGNOSIS — I2510 Atherosclerotic heart disease of native coronary artery without angina pectoris: Secondary | ICD-10-CM

## 2023-05-31 MED ORDER — WEGOVY 0.5 MG/0.5ML ~~LOC~~ SOAJ: 0.5000 mg | SUBCUTANEOUS | 0 refills | Status: DC

## 2023-06-01 NOTE — Assessment & Plan Note (Signed)
 Increase Wegovy to 0.5 mg/week.  Advised the importance of adequate protein intake on this medication as well as resistance training which will not only improve insulin sensitivity but also build muscle mass and reduce risk of atrophy.  Advised importance of portion control and slower eating on this medication to minimize GI side effects. Avoid high fat foods as these may cause discomfort. Patient aware of potential for weight regain when eventually stopping the medication. For this reason, continued efforts toward improving lifestyle will be particularly important moving forward.

## 2023-06-01 NOTE — Progress Notes (Signed)
 Date:  05/31/2023   Name:  Alexander Simon   DOB:  09/16/1970   MRN:  [id number]   Chief Complaint: Weight Check (Patient presents today for a weight check. He has lost 10 lbs since his last visit. He is ready to go up on dose his for [facility]. )  HPI Arty presents for 1 month follow-up on weight management after starting [facility] last visit.  He reports excellent results even at this starting dose with a 10 pound weight loss over the last month.  He is also working hard to make the most of this "second chance" after his heart attack and CABG, so he has gotten back in the gym.  He is tolerating Wegovy well and would like to increase dose of medication.   Separately, we talked about his erectile dysfunction last visit, which is likely vasculogenic.  I refilled sildenafil for him, but he has not yet had the opportunity to try it.  Intends to take his first dose tonight.   Medication list has been reviewed and updated.  Current Meds  Medication Sig   amLODipine (NORVASC) 5 MG tablet Take 1 tablet (5 mg total) by mouth daily.   aspirin EC 81 MG tablet Take 1 tablet (81 mg total) by mouth daily. Swallow whole.   carvedilol (COREG) 12.5 MG tablet Take 1 tablet (12.5 mg total) by mouth 2 (two) times daily with a meal.   clopidogrel (PLAVIX) 75 MG tablet Take 1 tablet (75 mg total) by mouth daily.   empagliflozin (JARDIANCE) 10 MG TABS tablet Take 1 tablet (10 mg total) by mouth daily.   losartan (COZAAR) 50 MG tablet Take 1 tablet (50 mg total) by mouth daily.   sildenafil (VIAGRA) 100 MG tablet Take 0.5-1 tablets (50-100 mg total) by mouth daily as needed for erectile dysfunction.   spironolactone (ALDACTONE) 25 MG tablet Take 0.5 tablets (12.5 mg total) by mouth daily.   WEGOVY 0.5 MG/0.5ML SOAJ Inject 0.5 mg into the skin once a week. Contact prescriber for refill.   [DISCONTINUED] Semaglutide-Weight Management (WEGOVY) 0.25 MG/0.5ML SOAJ Inject 0.25 mg into the skin once a week.     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   Coronary artery disease involving native coronary artery of native heart without angina pectoris 04/25/2023   Insomnia 01/07/2023   Anemia 01/07/2023   S/P CABG x 3 11/20/2022   H/O open surgical procurement of radial artery 11/20/2022   Dyslipidemia 11/13/2022   History of non-ST elevation myocardial infarction (NSTEMI) 11/12/2022   Class 2 severe obesity with serious comorbidity in adult [facility]) 11/12/2022   Prediabetes 11/12/2022   Mass of right side of neck    Skin tags, multiple acquired    Hypertension 02/20/2018   Chest pain 03/09/2017    No Known Allergies  Immunization History  Administered Date(s) Administered   PFIZER(Purple Top)SARS-COV-2 Vaccination 11/10/2019    Past Surgical History:  Procedure Laterality Date   CIRCUMCISION     CORONARY ARTERY BYPASS GRAFT N/A 11/19/2022   Procedure: CORONARY ARTERY BYPASS GRAFTING (CABG) X THREE  USING LEFT INTERNAL MAMMARY ARTERY, LEFT RADIAL ARTERY, AND ENDOSCOPICALLY HARVESTED RIGHT GREATER SAPHENOUS VEIN;  Surgeon: Corliss Skains, MD;  Location: [facility] OR;  Service: Open Heart Surgery;  Laterality: N/A;   EXCISION MASS NECK Right 06/12/2019   Procedure: EXCISION Superficial MASS NECK;  Surgeon: Duanne Guess, MD;  Location: [facility] ORS;  Service:  General;  Laterality: Right;   EXCISION OF SKIN TAG Bilateral 06/12/2019   Procedure: EXCISION OF SKIN TAG on neck;  Surgeon: Duanne Guess, MD;  Location: [facility] ORS;  Service: General;  Laterality: Bilateral;   LEFT HEART CATH AND CORONARY ANGIOGRAPHY N/A 11/13/2022   Procedure: LEFT HEART CATH AND CORONARY ANGIOGRAPHY;  Surgeon: Yvonne Kendall, MD;  Location: [facility] INVASIVE CV LAB;  Service: Cardiovascular;  Laterality: N/A;   RADIAL ARTERY HARVEST Left 11/19/2022   Procedure: LEFT RADIAL ARTERY HARVEST;  Surgeon: Corliss Skains, MD;  Location: [facility] OR;  Service: Open  Heart Surgery;  Laterality: Left;   SHOULDER SURGERY Right 2014   TEE WITHOUT CARDIOVERSION N/A 11/19/2022   Procedure: TRANSESOPHAGEAL ECHOCARDIOGRAM;  Surgeon: Corliss Skains, MD;  Location: [facility] OR;  Service: Open Heart Surgery;  Laterality: N/A;    Social History   Tobacco Use   Smoking status: Never   Smokeless tobacco: Never  Vaping Use   Vaping status: Never Used  Substance Use Topics   Alcohol use: No   Drug use: Never    Family History  Problem Relation Age of Onset   Breast cancer Mother    Lung cancer Mother    Cancer Maternal Uncle         05/31/2023    4:01 PM 04/25/2023   11:00 AM 01/07/2023    8:56 AM  GAD 7 : Generalized Anxiety Score  Nervous, Anxious, on Edge 0 0 0  Control/stop worrying 0 0 0  Worry too much - different things 0 0 0  Trouble relaxing 3 0 2  Restless 0 0 0  Easily annoyed or irritable 0 0 0  Afraid - awful might happen 0 0 0  Total GAD 7 Score 3 0 2  Anxiety Difficulty Not difficult at all Not difficult at all Not difficult at all       05/31/2023    4:01 PM 04/25/2023   11:00 AM 01/09/2023    4:19 PM  Depression screen PHQ 2/9  Decreased Interest 0 0 0  Down, Depressed, Hopeless 0 0 0  PHQ - 2 Score 0 0 0  Altered sleeping 1 0 1  Tired, decreased energy 0 0 1  Change in appetite 0 0 0  Feeling bad or failure about yourself  0 0 0  Trouble concentrating 0 0 0  Moving slowly or fidgety/restless 0 0 0  Suicidal thoughts 0 0 0  PHQ-9 Score 1 0 2  Difficult doing work/chores Not difficult at all Not difficult at all Not difficult at all    BP Readings from Last 3 Encounters:  05/31/23 124/82  04/25/23 124/76  03/22/23 (!) 162/78    Wt Readings from Last 3 Encounters:  05/31/23 287 lb 12.8 oz (130.5 kg)  04/25/23 297 lb (134.7 kg)  03/22/23 (!) 301 lb 9.6 oz (136.8 kg)    BP 124/82 (BP Location: Right Arm, Cuff Size: Large)   Pulse 89   Temp 98.2 F (36.8 C)   Ht 6\' 1"  (1.854 m)   Wt 287 lb 12.8 oz (130.5 kg)    SpO2 96%   BMI 37.97 kg/m   Physical Exam Vitals and nursing note reviewed.  Constitutional:      Appearance: Normal appearance.  Cardiovascular:     Rate and Rhythm: Normal rate.  Pulmonary:     Effort: Pulmonary effort is normal.  Abdominal:     General: There is no distension.  Musculoskeletal:  General: Normal range of motion.  Skin:    General: Skin is warm and dry.  Neurological:     Mental Status: He is alert and oriented to person, place, and time.     Gait: Gait is intact.  Psychiatric:        Mood and Affect: Mood and affect normal.     Recent Labs     Component Value Date/Time   NA 138 04/17/2023 0828   NA 138 09/19/2012 0003   K 4.4 04/17/2023 0828   K 3.9 09/19/2012 0003   CL 101 04/17/2023 0828   CL 105 09/19/2012 0003   CO2 24 04/17/2023 0828   CO2 30 09/19/2012 0003   GLUCOSE 98 04/17/2023 0828   GLUCOSE 105 (H) 11/25/2022 0316   GLUCOSE 111 (H) 09/19/2012 0003   BUN 9 04/17/2023 0828   BUN 14 09/19/2012 0003   CREATININE 1.00 04/17/2023 0828   CREATININE 1.00 09/19/2012 0003   CALCIUM 9.2 04/17/2023 0828   CALCIUM 8.4 (L) 09/19/2012 0003   PROT 7.9 11/12/2022 2000   PROT 7.5 05/05/2018 1047   ALBUMIN 4.0 11/12/2022 2000   ALBUMIN 4.5 05/05/2018 1047   AST 23 11/12/2022 2000   ALT 34 11/12/2022 2000   ALKPHOS 50 11/12/2022 2000   BILITOT 0.9 11/12/2022 2000   BILITOT <0.2 05/05/2018 1047   GFRNONAA >60 11/25/2022 0316   GFRNONAA >60 09/19/2012 0003   GFRAA 101 05/05/2018 1047   GFRAA >60 09/19/2012 0003    Lab Results  Component Value Date   WBC 5.7 01/07/2023   HGB 10.8 (L) 01/07/2023   HCT 35.4 (L) 01/07/2023   MCV 79 01/07/2023   PLT 293 01/07/2023   Lab Results  Component Value Date   HGBA1C 6.2 (H) 11/12/2022   Lab Results  Component Value Date   CHOL 106 01/14/2023   HDL 40 01/14/2023   LDLCALC 50 01/14/2023   TRIG 75 01/14/2023   CHOLHDL 2.7 01/14/2023   Lab Results  Component Value Date   TSH 1.991  11/12/2022     Assessment and Plan:  Class 2 severe obesity due to excess calories with serious comorbidity and body mass index (BMI) of 37.0 to 37.9 in adult [facility]) Assessment & Plan: Increase Wegovy to 0.5 mg/week.  Advised the importance of adequate protein intake on this medication as well as resistance training which will not only improve insulin sensitivity but also build muscle mass and reduce risk of atrophy.  Advised importance of portion control and slower eating on this medication to minimize GI side effects. Avoid high fat foods as these may cause discomfort. Patient aware of potential for weight regain when eventually stopping the medication. For this reason, continued efforts toward improving lifestyle will be particularly important moving forward.     Orders: -     Wegovy; Inject 0.5 mg into the skin once a week. Contact prescriber for refill.  Dispense: 2 mL; Refill: 0     Return in about 4 weeks (around 06/28/2023) for OV f/u weight.    Alvester Morin, PA-C, DMSc, Nutritionist [facility] Primary Care and Sports Medicine [facility] [phone]

## 2023-06-11 ENCOUNTER — Other Ambulatory Visit: Payer: Self-pay

## 2023-07-08 ENCOUNTER — Other Ambulatory Visit: Payer: Self-pay

## 2023-07-08 DIAGNOSIS — Z6837 Body mass index (BMI) 37.0-37.9, adult: Secondary | ICD-10-CM

## 2023-07-12 ENCOUNTER — Ambulatory Visit: Admitting: Physician Assistant

## 2023-07-12 ENCOUNTER — Encounter: Payer: Self-pay | Admitting: Physician Assistant

## 2023-07-12 VITALS — BP 122/70 | HR 88 | Temp 98.4°F | Ht 73.0 in | Wt 288.0 lb

## 2023-07-12 DIAGNOSIS — E66812 Obesity, class 2: Secondary | ICD-10-CM

## 2023-07-12 DIAGNOSIS — N529 Male erectile dysfunction, unspecified: Secondary | ICD-10-CM

## 2023-07-12 DIAGNOSIS — Z6837 Body mass index (BMI) 37.0-37.9, adult: Secondary | ICD-10-CM | POA: Diagnosis not present

## 2023-07-12 MED ORDER — SILDENAFIL CITRATE 100 MG PO TABS: 100.0000 mg | ORAL_TABLET | Freq: Every day | ORAL | 2 refills | Status: AC | PRN

## 2023-07-12 MED ORDER — WEGOVY 1 MG/0.5ML ~~LOC~~ SOAJ: 1.0000 mg | SUBCUTANEOUS | 0 refills | Status: DC

## 2023-07-12 NOTE — Progress Notes (Signed)
 Date:  07/12/2023   Name:  Alexander Simon   DOB:  Oct 18, 1970   MRN:  [id number]   Chief Complaint: Weight Check  HPI Alexander Simon presents for 1 month follow-up on weight management, slightly overdue by about 2 weeks, currently using Wegovy  0.5 mg with last dose administered 10 days ago.  He is doing well with medication and would like to proceed with dose increase.  He continues with frequent strength training at the gym, and is determined to lose weight.  Others have noticed that his clothes are fitting better.  Compared to last office visit, weight appears stable on our scale, but he has significantly heavier clothes on today and also has not used the Wegovy  in nearly 2 weeks.  Unfortunately maximum dose sildenafil  has not helped with his ED very much.  We have previously discussed that while testosterone  could be checked, testosterone  therapy would not be clinically appropriate in this patient with prior MI.  Suspect vasculogenic etiology for his ED, which we are working on improving by addressing his metabolic conditions.   Medication list has been reviewed and updated.  Current Meds  Medication Sig   amLODipine  (NORVASC ) 5 MG tablet Take 1 tablet (5 mg total) by mouth daily.   aspirin  EC 81 MG tablet Take 1 tablet (81 mg total) by mouth daily. Swallow whole.   carvedilol  (COREG ) 12.5 MG tablet Take 1 tablet (12.5 mg total) by mouth 2 (two) times daily with a meal.   clopidogrel  (PLAVIX ) 75 MG tablet Take 1 tablet (75 mg total) by mouth daily.   empagliflozin  (JARDIANCE ) 10 MG TABS tablet Take 1 tablet (10 mg total) by mouth daily.   losartan  (COZAAR ) 50 MG tablet Take 1 tablet (50 mg total) by mouth daily.   spironolactone  (ALDACTONE ) 25 MG tablet Take 0.5 tablets (12.5 mg total) by mouth daily.   WEGOVY  1 MG/0.5ML SOAJ Inject 1 mg into the skin once a week. Use this dose for 1 month (4 shots) and then increase to next higher dose.   [DISCONTINUED] sildenafil  (VIAGRA ) 100 MG tablet Take  0.5-1 tablets (50-100 mg total) by mouth daily as needed for erectile dysfunction.     Review of Systems  Patient Active Problem List   Diagnosis Date Noted   Vasculogenic erectile dysfunction 04/25/2023   Coronary artery disease involving native coronary artery of native heart without angina pectoris 04/25/2023   Insomnia 01/07/2023   Anemia 01/07/2023   S/P CABG x 3 11/20/2022   H/O open surgical procurement of radial artery 11/20/2022   Dyslipidemia 11/13/2022   History of non-ST elevation myocardial infarction (NSTEMI) 11/12/2022   Class 2 severe obesity with serious comorbidity in adult [facility]) 11/12/2022   Prediabetes 11/12/2022   Mass of right side of neck    Skin tags, multiple acquired    Hypertension 02/20/2018   Chest pain 03/09/2017    No Known Allergies  Immunization History  Administered Date(s) Administered   PFIZER(Purple Top)SARS-COV-2 Vaccination 11/10/2019    Past Surgical History:  Procedure Laterality Date   CIRCUMCISION     CORONARY ARTERY BYPASS GRAFT N/A 11/19/2022   Procedure: CORONARY ARTERY BYPASS GRAFTING (CABG) X THREE  USING LEFT INTERNAL MAMMARY ARTERY, LEFT RADIAL ARTERY, AND ENDOSCOPICALLY HARVESTED RIGHT GREATER SAPHENOUS VEIN;  Surgeon: Alexander Lovely, MD;  Location: [facility] OR;  Service: Open Heart Surgery;  Laterality: N/A;   EXCISION MASS NECK Right 06/12/2019   Procedure: EXCISION Superficial MASS NECK;  Surgeon: Alexander Stall, MD;  Location:  [facility] ORS;  Service: General;  Laterality: Right;   EXCISION OF SKIN TAG Bilateral 06/12/2019   Procedure: EXCISION OF SKIN TAG on neck;  Surgeon: Alexander Stall, MD;  Location: [facility] ORS;  Service: General;  Laterality: Bilateral;   LEFT HEART CATH AND CORONARY ANGIOGRAPHY N/A 11/13/2022   Procedure: LEFT HEART CATH AND CORONARY ANGIOGRAPHY;  Surgeon: Alexander Crisp, MD;  Location: [facility] INVASIVE CV LAB;  Service: Cardiovascular;  Laterality: N/A;   RADIAL ARTERY HARVEST Left 11/19/2022    Procedure: LEFT RADIAL ARTERY HARVEST;  Surgeon: Alexander Lovely, MD;  Location: [facility] OR;  Service: Open Heart Surgery;  Laterality: Left;   SHOULDER SURGERY Right 2014   TEE WITHOUT CARDIOVERSION N/A 11/19/2022   Procedure: TRANSESOPHAGEAL ECHOCARDIOGRAM;  Surgeon: Alexander Lovely, MD;  Location: [facility] OR;  Service: Open Heart Surgery;  Laterality: N/A;    Social History   Tobacco Use   Smoking status: Never   Smokeless tobacco: Never  Vaping Use   Vaping status: Never Used  Substance Use Topics   Alcohol use: No   Drug use: Never    Family History  Problem Relation Age of Onset   Breast cancer Mother    Lung cancer Mother    Cancer Maternal Uncle         07/12/2023    1:33 PM 05/31/2023    4:01 PM 04/25/2023   11:00 AM 01/07/2023    8:56 AM  GAD 7 : Generalized Anxiety Score  Nervous, Anxious, on Edge 0 0 0 0  Control/stop worrying 0 0 0 0  Worry too much - different things 3 0 0 0  Trouble relaxing 0 3 0 2  Restless 0 0 0 0  Easily annoyed or irritable 0 0 0 0  Afraid - awful might happen 0 0 0 0  Total GAD 7 Score 3 3 0 2  Anxiety Difficulty Not difficult at all Not difficult at all Not difficult at all Not difficult at all       07/12/2023    1:33 PM 05/31/2023    4:01 PM 04/25/2023   11:00 AM  Depression screen PHQ 2/9  Decreased Interest 0 0 0  Down, Depressed, Hopeless 0 0 0  PHQ - 2 Score 0 0 0  Altered sleeping  1 0  Tired, decreased energy  0 0  Change in appetite  0 0  Feeling bad or failure about yourself   0 0  Trouble concentrating  0 0  Moving slowly or fidgety/restless  0 0  Suicidal thoughts  0 0  PHQ-9 Score  1 0  Difficult doing work/chores  Not difficult at all Not difficult at all    BP Readings from Last 3 Encounters:  07/12/23 122/70  05/31/23 124/82  04/25/23 124/76    Wt Readings from Last 3 Encounters:  07/12/23 288 lb (130.6 kg)  05/31/23 287 lb 12.8 oz (130.5 kg)  04/25/23 297 lb (134.7 kg)    BP 122/70   Pulse 88    Temp 98.4 F (36.9 C)   Ht 6\' 1"  (1.854 m)   Wt 288 lb (130.6 kg)   SpO2 96%   BMI 38.00 kg/m   Physical Exam Vitals and nursing note reviewed.  Constitutional:      Appearance: Normal appearance.  Cardiovascular:     Rate and Rhythm: Normal rate.  Pulmonary:     Effort: Pulmonary effort is normal.  Abdominal:     General: There is no distension.  Musculoskeletal:  General: Normal range of motion.  Skin:    General: Skin is warm and dry.  Neurological:     Mental Status: He is alert and oriented to person, place, and time.     Gait: Gait is intact.  Psychiatric:        Mood and Affect: Mood and affect normal.     Recent Labs     Component Value Date/Time   NA 138 04/17/2023 0828   NA 138 09/19/2012 0003   K 4.4 04/17/2023 0828   K 3.9 09/19/2012 0003   CL 101 04/17/2023 0828   CL 105 09/19/2012 0003   CO2 24 04/17/2023 0828   CO2 30 09/19/2012 0003   GLUCOSE 98 04/17/2023 0828   GLUCOSE 105 (H) 11/25/2022 0316   GLUCOSE 111 (H) 09/19/2012 0003   BUN 9 04/17/2023 0828   BUN 14 09/19/2012 0003   CREATININE 1.00 04/17/2023 0828   CREATININE 1.00 09/19/2012 0003   CALCIUM  9.2 04/17/2023 0828   CALCIUM  8.4 (L) 09/19/2012 0003   PROT 7.9 11/12/2022 2000   PROT 7.5 05/05/2018 1047   ALBUMIN  4.0 11/12/2022 2000   ALBUMIN  4.5 05/05/2018 1047   AST 23 11/12/2022 2000   ALT 34 11/12/2022 2000   ALKPHOS 50 11/12/2022 2000   BILITOT 0.9 11/12/2022 2000   BILITOT <0.2 05/05/2018 1047   GFRNONAA >60 11/25/2022 0316   GFRNONAA >60 09/19/2012 0003   GFRAA 101 05/05/2018 1047   GFRAA >60 09/19/2012 0003    Lab Results  Component Value Date   WBC 5.7 01/07/2023   HGB 10.8 (L) 01/07/2023   HCT 35.4 (L) 01/07/2023   MCV 79 01/07/2023   PLT 293 01/07/2023   Lab Results  Component Value Date   HGBA1C 6.2 (H) 11/12/2022   Lab Results  Component Value Date   CHOL 106 01/14/2023   HDL 40 01/14/2023   LDLCALC 50 01/14/2023   TRIG 75 01/14/2023   CHOLHDL  2.7 01/14/2023   Lab Results  Component Value Date   TSH 1.991 11/12/2022     Assessment and Plan:  Class 2 severe obesity due to excess calories with serious comorbidity and body mass index (BMI) of 37.0 to 37.9 in adult [facility]) Assessment & Plan: Refill Wegovy  at the 1 mg dose.  Reminded that he should never go more than 2 weeks between doses, as this will require restart at the initial dose of 0.25 mg.  Expresses some inconvenience regarding monthly appointments, so I would be willing to see him virtually for the next follow-up.  He is able to check weight at home.  Advised the importance of adequate protein intake on this medication as well as resistance training which will not only improve insulin  sensitivity but also build muscle mass and reduce risk of atrophy.  Advised importance of portion control and slower eating on this medication to minimize GI side effects. Avoid high fat foods as these may cause discomfort. Patient aware of potential for weight regain when eventually stopping the medication. For this reason, continued efforts toward improving lifestyle will be particularly important moving forward.    Orders: -     Wegovy ; Inject 1 mg into the skin once a week. Use this dose for 1 month (4 shots) and then increase to next higher dose.  Dispense: 2 mL; Refill: 0  Vasculogenic erectile dysfunction, unspecified vasculogenic erectile dysfunction type Assessment & Plan: Patient would like for me to refill sildenafil  as a 90 tablet supply.  He clarifies with  me that he does not use them daily, but just likes to get a larger quantity at once.  Orders: -     Sildenafil  Citrate; Take 1 tablet (100 mg total) by mouth daily as needed for erectile dysfunction.  Dispense: 90 tablet; Refill: 2     Return in about 4 weeks (around 08/09/2023) for VIRTUAL OV weight management.    Cody Das, PA-C, DMSc, Nutritionist [facility] [phone]

## 2023-07-12 NOTE — Assessment & Plan Note (Signed)
 Patient would like for me to refill sildenafil  as a 90 tablet supply.  He clarifies with me that he does not use them daily, but just likes to get a larger quantity at once.

## 2023-07-12 NOTE — Assessment & Plan Note (Addendum)
 Refill Wegovy  at the 1 mg dose.  Reminded that he should never go more than 2 weeks between doses, as this will require restart at the initial dose of 0.25 mg.  Expresses some inconvenience regarding monthly appointments, so I would be willing to see him virtually for the next follow-up.  He is able to check weight at home.  Advised the importance of adequate protein intake on this medication as well as resistance training which will not only improve insulin  sensitivity but also build muscle mass and reduce risk of atrophy.  Advised importance of portion control and slower eating on this medication to minimize GI side effects. Avoid high fat foods as these may cause discomfort. Patient aware of potential for weight regain when eventually stopping the medication. For this reason, continued efforts toward improving lifestyle will be particularly important moving forward.

## 2023-07-17 NOTE — Progress Notes (Signed)
 Cardiology Office Note:  .   Date:  07/20/2023  ID:  Alexander Simon, DOB 01-Dec-1970, MRN [id number] PCP: Alexander Rancher, PA  [facility] Providers Cardiologist:  Alexander Crisp, MD     History of Present Illness: .   Alexander Simon is a 54 y.o. male with history of coranty artery disease status post CABG in the setting of NSTEMI (10/2022; LIMA-LAD, SVG-diagonal, and free radial-OM), heart failure with mildly reduced ejection fraction due to ischemic cardiomyopathy, hypertension, hyperlipidemia, prediabetes, and obesity, who presents for follow-up of CAD and HFmrEF.  He was last seen in our office in January by Alexander Simon, [place], at which time he was doing well other than some occasional chest wall pain and panic attacks.  He was started on spironolactone  12.5 mg daily.  Today, [name] reports that he has been feeling well other than a single episode of transient shortness of breath that occurred while he was at work and felt particularly hot.  The dyspnea resolved within 5 minutes of drinking cold water and resting.  It did not recur and was not accompanied by chest pain.  He continues to exercise regularly and has not experienced any chest pain or dyspnea with this.  He also denies palpitations, lightheadedness, and edema.  His median sternotomy incision has healed well.  He walks and bikes regularly and also recently began playing basketball again.  He notes that his BP at home is usually well-controlled, most recently 116/78.  ROS: See HPI  Studies Reviewed: Aaron Aas   EKG Interpretation Date/Time:  Friday Jul 19 2023 09:49:14 EDT Ventricular Rate:  76 PR Interval:  148 QRS Duration:  96 QT Interval:  392 QTC Calculation: 441 R Axis:   -5  Text Interpretation: Normal sinus rhythm Minimal voltage criteria for left ventricular hypertrophy ST & T wave abnormality, consider anterolateral ischemia versus abnormal repolarization Abnormal ECG When compared with ECG of 22-Mar-2023 11:03,  HEART RATE has increased Otherwise no significant change Confirmed by Maxine Fredman, Veryl Gottron [phone]) on 07/20/2023 4:55:29 PM    Risk Assessment/Calculations:             Physical Exam:   VS:  BP 128/74 (BP Location: Left Arm, Patient Position: Sitting, Cuff Size: Large)   Pulse 84   Ht 6' (1.829 m)   Wt 284 lb 3.2 oz (128.9 kg)   SpO2 96%   BMI 38.54 kg/m    Wt Readings from Last 3 Encounters:  07/19/23 284 lb 3.2 oz (128.9 kg)  07/12/23 288 lb (130.6 kg)  05/31/23 287 lb 12.8 oz (130.5 kg)    General:  NAD. Neck: No JVD or HJR. Lungs: Clear to auscultation bilaterally without wheezes or crackles. Heart: Regular rate and rhythm without murmurs, rubs, or gallops. Abdomen: Soft, nontender, nondistended. Extremities: No lower extremity edema.  ASSESSMENT AND PLAN: .    Coronary artery disease: [name] has recovered well from his CABG in the setting of NSTEMI in 10/2022.  He reports a single episode of shortness of breath at work, that lasted about 5 minutes.  He has not had any chest pain and has been able to exercise without any restrictions.  His EKG again shows significant anterolateral ST/T abnormalities, though these findings predate his CABG and did not change following revascularization; I suspect some of these changes are due to abnormal repolarization with most recent echo demonstrating moderate LVH.  We will continue his current medications for secondary prevention, including aspirin  and clopidogrel .  We will likely  discontinue clopidogrel  when he reaches the 1-year anniversary of his NSTEMI in 10/2023.  Defer NTG prescription for now with intermittent sildenafil  use.  Heart failure with improved ejection fraction: [name] appears euvolemic with NYHA class I symptoms.  Most recent echo in 01/2023 showed improved in his LVEF to 50-55%.  He was restarted on spironolactone  at his last visit; we will recheck a BMP today to ensure stable renal function and potassium; plan to continue  current doses of carvedilol , empagliflozin , losartan , and spironolactone .  Hypertension: BP initially mildly elevated but improved on recheck.  Home reading typically normal as well.  Continue current regimen of amlodipine , carvedilol , losartan , and spironolactone .  Hyperlipidemia: Lipids well-controlled on last check in 12/2022, with LDL 50 and TG 75.  Previously on atorvastatin  80 mg daily, though this has fallen off his medication list.  We will confirm that he is still taking, otherwise restart unless he reports side effects in the past.    Dispo: Return to clinic in 4 months.  Signed, Alexander Crisp, MD

## 2023-07-19 ENCOUNTER — Ambulatory Visit: Payer: Commercial Managed Care - PPO | Attending: Internal Medicine | Admitting: Internal Medicine

## 2023-07-19 VITALS — BP 128/74 | HR 84 | Ht 72.0 in | Wt 284.2 lb

## 2023-07-19 DIAGNOSIS — E785 Hyperlipidemia, unspecified: Secondary | ICD-10-CM

## 2023-07-19 DIAGNOSIS — Z79899 Other long term (current) drug therapy: Secondary | ICD-10-CM

## 2023-07-19 DIAGNOSIS — I5032 Chronic diastolic (congestive) heart failure: Secondary | ICD-10-CM | POA: Diagnosis not present

## 2023-07-19 DIAGNOSIS — I2510 Atherosclerotic heart disease of native coronary artery without angina pectoris: Secondary | ICD-10-CM

## 2023-07-19 DIAGNOSIS — R0602 Shortness of breath: Secondary | ICD-10-CM

## 2023-07-19 DIAGNOSIS — I10 Essential (primary) hypertension: Secondary | ICD-10-CM

## 2023-07-19 NOTE — Patient Instructions (Signed)
 Medication Instructions:  No Changes in Medication today.  *If you need a refill on your cardiac medications before your next appointment, please call your pharmacy*  Lab Work: Your provider would like for you to have following labs drawn today BMP.    If you have labs (blood work) drawn today and your tests are completely normal, you will receive your results only by: MyChart Message (if you have MyChart) OR A paper copy in the mail If you have any lab test that is abnormal or we need to change your treatment, we will call you to review the results.  Testing/Procedures: No test ordered today   Follow-Up:  4 months with Alexander Simon End.  At [facility], you and your health needs are our priority.  As part of our continuing mission to provide you with exceptional heart care, our providers are all part of one team.  This team includes your primary Cardiologist (physician) and Advanced Practice Providers or APPs (Physician Assistants and Nurse Practitioners) who all work together to provide you with the care you need, when you need it.

## 2023-07-20 ENCOUNTER — Encounter: Payer: Self-pay | Admitting: Internal Medicine

## 2023-07-20 DIAGNOSIS — I5032 Chronic diastolic (congestive) heart failure: Secondary | ICD-10-CM | POA: Insufficient documentation

## 2023-07-20 DIAGNOSIS — Z79899 Other long term (current) drug therapy: Secondary | ICD-10-CM

## 2023-07-20 LAB — BASIC METABOLIC PANEL WITH GFR
BUN/Creatinine Ratio: 9 (ref 9–20)
BUN: 9 mg/dL (ref 6–24)
CO2: 21 mmol/L (ref 20–29)
Calcium: 9.2 mg/dL (ref 8.7–10.2)
Chloride: 104 mmol/L (ref 96–106)
Creatinine, Ser: 0.96 mg/dL (ref 0.76–1.27)
Glucose: 84 mg/dL (ref 70–99)
Potassium: 4.4 mmol/L (ref 3.5–5.2)
Sodium: 139 mmol/L (ref 134–144)
eGFR: 95 mL/min/{1.73_m2} (ref >59)

## 2023-07-22 ENCOUNTER — Ambulatory Visit: Payer: Self-pay | Admitting: Internal Medicine

## 2023-07-23 ENCOUNTER — Other Ambulatory Visit: Payer: Self-pay | Admitting: Physician Assistant

## 2023-07-24 MED ORDER — EZETIMIBE 10 MG PO TABS: 10.0000 mg | ORAL_TABLET | Freq: Every day | ORAL | 3 refills | Status: DC

## 2023-07-25 ENCOUNTER — Other Ambulatory Visit: Payer: Self-pay

## 2023-07-25 NOTE — Telephone Encounter (Signed)
 Requested medication (s) are due for refill today: yes  Requested medication (s) are on the active medication list: yes  Last refill:  01/23/23 #90/1  Future visit scheduled: yes  Notes to clinic:  Unable to refill per protocol due to failed labs, no updated results.      Requested Prescriptions  Pending Prescriptions Disp Refills   JARDIANCE  10 MG TABS tablet [Pharmacy Med Name: JARDIANCE  10 MG TABLET] 90 tablet 1    Sig: TAKE 1 TABLET BY MOUTH EVERY DAY     Endocrinology:  Diabetes - SGLT2 Inhibitors Failed - 07/25/2023  1:50 PM      Failed - HBA1C is between 0 and 7.9 and within 180 days    Hgb A1c MFr Bld  Date Value Ref Range Status  11/12/2022 6.2 (H) 4.8 - 5.6 % Final    Comment:    (NOTE)         Prediabetes: 5.7 - 6.4         Diabetes: >6.4         Glycemic control for adults with diabetes: <7.0          Passed - Cr in normal range and within 360 days    Creatinine  Date Value Ref Range Status  09/19/2012 1.00 0.60 - 1.30 mg/dL Final   Creatinine, Ser  Date Value Ref Range Status  07/19/2023 0.96 0.76 - 1.27 mg/dL Final         Passed - eGFR in normal range and within 360 days    EGFR (African American)  Date Value Ref Range Status  09/19/2012 >60  Final   GFR calc Af Amer  Date Value Ref Range Status  05/05/2018 101 >59 mL/min/1.73 Final   EGFR (Non-African Amer.)  Date Value Ref Range Status  09/19/2012 >60  Final    Comment:    eGFR values <37mL/min/1.73 m2 may be an indication of chronic kidney disease (CKD). Calculated eGFR is useful in patients with stable renal function. The eGFR calculation will not be reliable in acutely ill patients when serum creatinine is changing rapidly. It is not useful in  patients on dialysis. The eGFR calculation may not be applicable to patients at the low and high extremes of body sizes, pregnant women, and vegetarians.    GFR, Estimated  Date Value Ref Range Status  11/25/2022 >60 >60 mL/min Final     Comment:    (NOTE) Calculated using the CKD-EPI Creatinine Equation (2021)    eGFR  Date Value Ref Range Status  07/19/2023 95 >59 mL/min/1.73 Final         Passed - Valid encounter within last 6 months    Recent Outpatient Visits           1 week ago Class 2 severe obesity due to excess calories with serious comorbidity and body mass index (BMI) of 37.0 to 37.9 in adult [facility])   [place] [facility] at [facility], Arleen Lacer, [place]   1 month ago Class 2 severe obesity due to excess calories with serious comorbidity and body mass index (BMI) of 37.0 to 37.9 in adult [facility])   [place] [facility] Medicine at [facility], Arleen Lacer, PA   3 months ago Class 2 severe obesity due to excess calories with serious comorbidity and body mass index (BMI) of 39.0 to 39.9 in adult [facility])   [facility] at [facility], Arleen Lacer,  PA       Future Appointments             In 2 weeks Larkin Plumb, Arleen Lacer, PA [facility] Sports Medicine at [facility], [facility]   In 3 months End, Veryl Gottron, MD [facility] HeartCare at [facility]            Signed Prescriptions Disp Refills   clopidogrel  (PLAVIX ) 75 MG tablet 90 tablet 0    Sig: TAKE 1 TABLET BY MOUTH EVERY DAY     Hematology: Antiplatelets - clopidogrel  Failed - 07/25/2023  1:50 PM      Failed - HCT in normal range and within 180 days    Hematocrit  Date Value Ref Range Status  01/07/2023 35.4 (L) 37.5 - 51.0 % Final         Failed - HGB in normal range and within 180 days    Hemoglobin  Date Value Ref Range Status  01/07/2023 10.8 (L) 13.0 - 17.7 g/dL Final         Failed - PLT in normal range and within 180 days    Platelets  Date Value Ref Range Status  01/07/2023 293 150 - 450 x10E3/uL Final         Passed - Cr in normal range and within 360 days    Creatinine  Date Value Ref Range Status  09/19/2012 1.00 0.60 -  1.30 mg/dL Final   Creatinine, Ser  Date Value Ref Range Status  07/19/2023 0.96 0.76 - 1.27 mg/dL Final         Passed - Valid encounter within last 6 months    Recent Outpatient Visits           1 week ago Class 2 severe obesity due to excess calories with serious comorbidity and body mass index (BMI) of 37.0 to 37.9 in adult [facility])   [place] Primary Care & Sports Medicine at [facility], Arleen Lacer, PA   1 month ago Class 2 severe obesity due to excess calories with serious comorbidity and body mass index (BMI) of 37.0 to 37.9 in adult [facility])   [place] Primary Care & Sports Medicine at [facility], Arleen Lacer, PA   3 months ago Class 2 severe obesity due to excess calories with serious comorbidity and body mass index (BMI) of 39.0 to 39.9 in adult [facility])   [facility] Health Primary Care & Sports Medicine at [facility], Arleen Lacer, PA       Future Appointments             In 2 weeks Larkin Plumb, Arleen Lacer, PA [facility] Health Primary Care & Sports Medicine at [facility], [facility]   In 3 months End, Veryl Gottron, MD [facility] at [facility]             carvedilol  (COREG ) 12.5 MG tablet 180 tablet 0    Sig: TAKE 1 TABLET (12.5MG  TOTAL) BY MOUTH TWICE A DAY WITH MEALS     Cardiovascular: Beta Blockers 3 Passed - 07/25/2023  1:50 PM      Passed - Cr in normal range and within 360 days    Creatinine  Date Value Ref Range Status  09/19/2012 1.00 0.60 - 1.30 mg/dL Final   Creatinine, Ser  Date Value Ref Range Status  07/19/2023 0.96 0.76 - 1.27 mg/dL Final         Passed - AST in normal range and within 360 days    AST  Date Value Ref Range Status  11/12/2022 23 15 - 41 U/L Final         Passed - ALT in normal range and within 360 days    ALT  Date Value Ref Range Status  11/12/2022 34 0 - 44 U/L Final         Passed - Last BP in normal range    BP Readings from Last 1 Encounters:  07/19/23 128/74         Passed - Last Heart  Rate in normal range    Pulse Readings from Last 1 Encounters:  07/19/23 84         Passed - Valid encounter within last 6 months    Recent Outpatient Visits           1 week ago Class 2 severe obesity due to excess calories with serious comorbidity and body mass index (BMI) of 37.0 to 37.9 in adult [facility])   [place] [facility] Sports Medicine at [facility], Arleen Lacer, [place]   1 month ago Class 2 severe obesity due to excess calories with serious comorbidity and body mass index (BMI) of 37.0 to 37.9 in adult [facility])   [place] Primary Care & Sports Medicine at [facility], Arleen Lacer, PA   3 months ago Class 2 severe obesity due to excess calories with serious comorbidity and body mass index (BMI) of 39.0 to 39.9 in adult [facility])   [place] Primary Care & Sports Medicine at [facility], Arleen Lacer, PA       Future Appointments             In 2 weeks Larkin Plumb, Arleen Lacer, PA [facility] Primary Care & Sports Medicine at [facility], [facility]   In 3 months End, Veryl Gottron, MD [facility] HeartCare at [facility]             amLODipine  (NORVASC ) 5 MG tablet 90 tablet 0    Sig: TAKE 1 TABLET (5 MG TOTAL) BY MOUTH DAILY.     Cardiovascular: Calcium  Channel Blockers 2 Passed - 07/25/2023  1:50 PM      Passed - Last BP in normal range    BP Readings from Last 1 Encounters:  07/19/23 128/74         Passed - Last Heart Rate in normal range    Pulse Readings from Last 1 Encounters:  07/19/23 84         Passed - Valid encounter within last 6 months    Recent Outpatient Visits           1 week ago Class 2 severe obesity due to excess calories with serious comorbidity and body mass index (BMI) of 37.0 to 37.9 in adult [facility])   [place] [facility] & Sports Medicine at [facility], Arleen Lacer, PA   1 month ago Class 2 severe obesity due to excess calories with serious comorbidity and body mass index (BMI) of 37.0 to 37.9 in adult  [facility])   [place] Primary Care & Sports Medicine at [facility], Arleen Lacer, PA   3 months ago Class 2 severe obesity due to excess calories with serious comorbidity and body mass index (BMI) of 39.0 to 39.9 in adult [facility])   [facility] Health Primary Care & Sports Medicine at [facility], Arleen Lacer, [place]       Future Appointments  In 2 weeks [place], Arleen Lacer, PA [facility] Health Primary Care & Sports Medicine at [facility], [facility]   In 3 months End, Veryl Gottron, MD [facility] HeartCare at [facility]

## 2023-07-25 NOTE — Telephone Encounter (Signed)
 Requested Prescriptions  Pending Prescriptions Disp Refills   clopidogrel  (PLAVIX ) 75 MG tablet [Pharmacy Med Name: CLOPIDOGREL  75 MG TABLET] 90 tablet 0    Sig: TAKE 1 TABLET BY MOUTH EVERY DAY     Hematology: Antiplatelets - clopidogrel  Failed - 07/25/2023  1:49 PM      Failed - HCT in normal range and within 180 days    Hematocrit  Date Value Ref Range Status  01/07/2023 35.4 (L) 37.5 - 51.0 % Final         Failed - HGB in normal range and within 180 days    Hemoglobin  Date Value Ref Range Status  01/07/2023 10.8 (L) 13.0 - 17.7 g/dL Final         Failed - PLT in normal range and within 180 days    Platelets  Date Value Ref Range Status  01/07/2023 293 150 - 450 x10E3/uL Final         Passed - Cr in normal range and within 360 days    Creatinine  Date Value Ref Range Status  09/19/2012 1.00 0.60 - 1.30 mg/dL Final   Creatinine, Ser  Date Value Ref Range Status  07/19/2023 0.96 0.76 - 1.27 mg/dL Final         Passed - Valid encounter within last 6 months    Recent Outpatient Visits           1 week ago Class 2 severe obesity due to excess calories with serious comorbidity and body mass index (BMI) of 37.0 to 37.9 in adult [facility])   [place] Primary Care & Sports Medicine at [facility], Arleen Lacer, PA   1 month ago Class 2 severe obesity due to excess calories with serious comorbidity and body mass index (BMI) of 37.0 to 37.9 in adult [facility])   [place] Primary Care & Sports Medicine at [facility], Arleen Lacer, PA   3 months ago Class 2 severe obesity due to excess calories with serious comorbidity and body mass index (BMI) of 39.0 to 39.9 in adult [facility])   [place] Primary Care & Sports Medicine at [facility], Arleen Lacer, PA       Future Appointments             In 2 weeks Alexander Simon, Arleen Lacer, PA [facility] Health Primary Care & Sports Medicine at [facility], [facility]   In 3 months End, Alexander Gottron, MD [facility] Health HeartCare at  [facility]             carvedilol  (COREG ) 12.5 MG tablet [Pharmacy Med Name: CARVEDILOL  12.5 MG TABLET] 180 tablet 0    Sig: TAKE 1 TABLET (12.5MG  TOTAL) BY MOUTH TWICE A DAY WITH MEALS     Cardiovascular: Beta Blockers 3 Passed - 07/25/2023  1:49 PM      Passed - Cr in normal range and within 360 days    Creatinine  Date Value Ref Range Status  09/19/2012 1.00 0.60 - 1.30 mg/dL Final   Creatinine, Ser  Date Value Ref Range Status  07/19/2023 0.96 0.76 - 1.27 mg/dL Final         Passed - AST in normal range and within 360 days    AST  Date Value Ref Range Status  11/12/2022 23 15 - 41 U/L Final         Passed - ALT in normal range and within 360 days    ALT  Date Value Ref Range Status  11/12/2022 34 0 -  44 U/L Final         Passed - Last BP in normal range    BP Readings from Last 1 Encounters:  07/19/23 128/74         Passed - Last Heart Rate in normal range    Pulse Readings from Last 1 Encounters:  07/19/23 84         Passed - Valid encounter within last 6 months    Recent Outpatient Visits           1 week ago Class 2 severe obesity due to excess calories with serious comorbidity and body mass index (BMI) of 37.0 to 37.9 in adult [facility])   [place] Primary [facility] Sports Medicine at [facility], Arleen Lacer, PA   1 month ago Class 2 severe obesity due to excess calories with serious comorbidity and body mass index (BMI) of 37.0 to 37.9 in adult [facility])   [place] Primary Care & Sports Medicine at [facility], Arleen Lacer, PA   3 months ago Class 2 severe obesity due to excess calories with serious comorbidity and body mass index (BMI) of 39.0 to 39.9 in adult [facility])   [place] Primary Care & Sports Medicine at [facility], Arleen Lacer, PA       Future Appointments             In 2 weeks Alexander Simon, Arleen Lacer, PA [facility] Health Primary Care & Sports Medicine at [facility], [facility]   In 3 months End, Alexander Gottron, MD [facility] at [facility]             amLODipine  (NORVASC ) 5 MG tablet [Pharmacy Med Name: AMLODIPINE  BESYLATE 5 MG TAB] 90 tablet 0    Sig: TAKE 1 TABLET (5 MG TOTAL) BY MOUTH DAILY.     Cardiovascular: Calcium  Channel Blockers 2 Passed - 07/25/2023  1:49 PM      Passed - Last BP in normal range    BP Readings from Last 1 Encounters:  07/19/23 128/74         Passed - Last Heart Rate in normal range    Pulse Readings from Last 1 Encounters:  07/19/23 84         Passed - Valid encounter within last 6 months    Recent Outpatient Visits           1 week ago Class 2 severe obesity due to excess calories with serious comorbidity and body mass index (BMI) of 37.0 to 37.9 in adult [facility])   [place] Primary Care & Sports Medicine at [facility], Arleen Lacer, [place]   1 month ago Class 2 severe obesity due to excess calories with serious comorbidity and body mass index (BMI) of 37.0 to 37.9 in adult [facility])   [place] Primary Care & Sports Medicine at [facility], Arleen Lacer, PA   3 months ago Class 2 severe obesity due to excess calories with serious comorbidity and body mass index (BMI) of 39.0 to 39.9 in adult [facility])   [facility] Health Primary Care & Sports Medicine at [facility], Arleen Lacer, PA       Future Appointments             In 2 weeks Alexander Simon, Arleen Lacer, PA [facility] Health Primary Care & Sports Medicine at [facility], [facility]   In 3 months End, Alexander Gottron, MD [facility] HeartCare at [facility]  JARDIANCE  10 MG TABS tablet [Pharmacy Med Name: JARDIANCE  10 MG TABLET] 90 tablet 1    Sig: TAKE 1 TABLET BY MOUTH EVERY DAY     Endocrinology:  Diabetes - SGLT2 Inhibitors Failed - 07/25/2023  1:49 PM      Failed - HBA1C is between 0 and 7.9 and within 180 days    Hgb A1c MFr Bld  Date Value Ref Range Status  11/12/2022 6.2 (H) 4.8 - 5.6 % Final    Comment:    (NOTE)         Prediabetes: 5.7 - 6.4         Diabetes: >6.4          Glycemic control for adults with diabetes: <7.0          Passed - Cr in normal range and within 360 days    Creatinine  Date Value Ref Range Status  09/19/2012 1.00 0.60 - 1.30 mg/dL Final   Creatinine, Ser  Date Value Ref Range Status  07/19/2023 0.96 0.76 - 1.27 mg/dL Final         Passed - eGFR in normal range and within 360 days    EGFR (African American)  Date Value Ref Range Status  09/19/2012 >60  Final   GFR calc Af Amer  Date Value Ref Range Status  05/05/2018 101 >59 mL/min/1.73 Final   EGFR (Non-African Amer.)  Date Value Ref Range Status  09/19/2012 >60  Final    Comment:    eGFR values <73mL/min/1.73 m2 may be an indication of chronic kidney disease (CKD). Calculated eGFR is useful in patients with stable renal function. The eGFR calculation will not be reliable in acutely ill patients when serum creatinine is changing rapidly. It is not useful in  patients on dialysis. The eGFR calculation may not be applicable to patients at the low and high extremes of body sizes, pregnant women, and vegetarians.    GFR, Estimated  Date Value Ref Range Status  11/25/2022 >60 >60 mL/min Final    Comment:    (NOTE) Calculated using the CKD-EPI Creatinine Equation (2021)    eGFR  Date Value Ref Range Status  07/19/2023 95 >59 mL/min/1.73 Final         Passed - Valid encounter within last 6 months    Recent Outpatient Visits           1 week ago Class 2 severe obesity due to excess calories with serious comorbidity and body mass index (BMI) of 37.0 to 37.9 in adult [facility])   [place] Primary Care & Sports Medicine at [facility], Arleen Lacer, [place]   1 month ago Class 2 severe obesity due to excess calories with serious comorbidity and body mass index (BMI) of 37.0 to 37.9 in adult [facility])   [place] Primary Care & Sports Medicine at [facility], Arleen Lacer, PA   3 months ago Class 2 severe obesity due to excess calories with serious  comorbidity and body mass index (BMI) of 39.0 to 39.9 in adult [facility])   [facility] Health Primary Care & Sports Medicine at [facility], Arleen Lacer, PA       Future Appointments             In 2 weeks Alexander Simon, Arleen Lacer, PA [facility] Health Primary Care & Sports Medicine at [facility], [facility]   In 3 months End, Alexander Gottron, MD [facility] Heart[facility] at [facility]

## 2023-08-09 ENCOUNTER — Telehealth: Admitting: Physician Assistant

## 2023-08-16 ENCOUNTER — Telehealth (INDEPENDENT_AMBULATORY_CARE_PROVIDER_SITE_OTHER): Admitting: Physician Assistant

## 2023-08-16 ENCOUNTER — Other Ambulatory Visit: Payer: Self-pay | Admitting: Physician Assistant

## 2023-08-16 ENCOUNTER — Encounter: Payer: Self-pay | Admitting: Physician Assistant

## 2023-08-16 DIAGNOSIS — E66812 Obesity, class 2: Secondary | ICD-10-CM | POA: Diagnosis not present

## 2023-08-16 DIAGNOSIS — Z6837 Body mass index (BMI) 37.0-37.9, adult: Secondary | ICD-10-CM

## 2023-08-16 MED ORDER — WEGOVY 1 MG/0.5ML ~~LOC~~ SOAJ: 1.0000 mg | SUBCUTANEOUS | 0 refills | Status: DC

## 2023-08-16 NOTE — Progress Notes (Signed)
 Date:  08/16/2023   Name:  Alexander Simon   DOB:  1970/10/31   MRN:  [id number]   I connected with Alexander Simon on 08/16/23 via MyChart Video and verified that I am speaking with the correct person using appropriate identifiers. The limitations, risks, security and privacy concerns of performing an evaluation and management service by MyChart Video, including the higher likelihood of inaccurate diagnoses and treatments, and the availability of in person appointments were reviewed. The possible need of an additional face-to-face encounter for complete and high quality delivery of care was discussed. The patient was also made aware that there may be a patient responsible charge related to this service. The patient expressed understanding and wishes to proceed.   Provider location is in medical facility [facility]). Patient location is at their place of employment. People involved in care of the patient during this telehealth encounter were myself, my CMA, and my front office/scheduling team member.    Chief Complaint: Weight Management Screening (Wegovy  1 mg)  HPI  Alexander Simon presents for 1 month follow-up on weight management currently using Wegovy  1 mg (increased last time) with good tolerance and effect, reports home weight about 276 suggesting roughly 8 lb weight loss in the last month. He is doing well with medication and would like to proceed with dose increase.  He continues with frequent strength training at the gym, and is determined to make long-term changes to preserve his health. He has a stationary bike which he uses 3x per week and it has some resistance bands attached to the handle.    Medication list has been reviewed and updated.  Current Meds  Medication Sig   amLODipine  (NORVASC ) 5 MG tablet TAKE 1 TABLET (5 MG TOTAL) BY MOUTH DAILY.   aspirin  EC 81 MG tablet Take 1 tablet (81 mg total) by mouth daily. Swallow whole.    carvedilol  (COREG ) 12.5 MG tablet TAKE 1 TABLET (12.5MG  TOTAL) BY MOUTH TWICE A DAY WITH MEALS   clopidogrel  (PLAVIX ) 75 MG tablet TAKE 1 TABLET BY MOUTH EVERY DAY   ezetimibe  (ZETIA ) 10 MG tablet Take 1 tablet (10 mg total) by mouth daily.   JARDIANCE  10 MG TABS tablet TAKE 1 TABLET BY MOUTH EVERY DAY   losartan  (COZAAR ) 50 MG tablet Take 1 tablet (50 mg total) by mouth daily.   sildenafil  (VIAGRA ) 100 MG tablet Take 1 tablet (100 mg total) by mouth daily as needed for erectile dysfunction.   spironolactone  (ALDACTONE ) 25 MG tablet Take 0.5 tablets (12.5 mg total) by mouth daily.   [DISCONTINUED] WEGOVY  1 MG/0.5ML SOAJ Inject 1 mg into the skin once a week. Use this dose for 1 month (4 shots) and then increase to next higher dose.     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   Coronary artery disease involving native coronary artery of native heart without angina pectoris 04/25/2023   Insomnia 01/07/2023   Anemia 01/07/2023   S/P CABG x 3 11/20/2022   H/O open surgical procurement of radial artery 11/20/2022   Hyperlipidemia LDL goal <70 11/13/2022   History of non-ST elevation myocardial infarction (NSTEMI) 11/12/2022   Class 2 severe obesity with serious comorbidity in adult [facility]) 11/12/2022   Prediabetes 11/12/2022   Mass of right side of neck    Skin tags,  multiple acquired    Essential hypertension 02/20/2018   Chest pain 03/09/2017    No Known Allergies  Immunization History  Administered Date(s) Administered   PFIZER(Purple Top)SARS-COV-2 Vaccination 11/10/2019    Past Surgical History:  Procedure Laterality Date   CIRCUMCISION     CORONARY ARTERY BYPASS GRAFT N/A 11/19/2022   Procedure: CORONARY ARTERY BYPASS GRAFTING (CABG) X THREE  USING LEFT INTERNAL MAMMARY ARTERY, LEFT RADIAL ARTERY, AND ENDOSCOPICALLY HARVESTED  RIGHT GREATER SAPHENOUS VEIN;  Surgeon: Hilarie Lovely, MD;  Location: [facility];  Service: Open Heart Surgery;  Laterality: N/A;   EXCISION MASS NECK Right 06/12/2019   Procedure: EXCISION Superficial MASS NECK;  Surgeon: Mercy Stall, MD;  Location: [facility] ORS;  Service: General;  Laterality: Right;   EXCISION OF SKIN TAG Bilateral 06/12/2019   Procedure: EXCISION OF SKIN TAG on neck;  Surgeon: Mercy Stall, MD;  Location: [facility] ORS;  Service: General;  Laterality: Bilateral;   LEFT HEART CATH AND CORONARY ANGIOGRAPHY N/A 11/13/2022   Procedure: LEFT HEART CATH AND CORONARY ANGIOGRAPHY;  Surgeon: Sammy Crisp, MD;  Location: [facility] INVASIVE CV LAB;  Service: Cardiovascular;  Laterality: N/A;   RADIAL ARTERY HARVEST Left 11/19/2022   Procedure: LEFT RADIAL ARTERY HARVEST;  Surgeon: Hilarie Lovely, MD;  Location: [facility] OR;  Service: Open Heart Surgery;  Laterality: Left;   SHOULDER SURGERY Right 2014   TEE WITHOUT CARDIOVERSION N/A 11/19/2022   Procedure: TRANSESOPHAGEAL ECHOCARDIOGRAM;  Surgeon: Hilarie Lovely, MD;  Location: [facility] OR;  Service: Open Heart Surgery;  Laterality: N/A;    Social History   Tobacco Use   Smoking status: Never   Smokeless tobacco: Never  Vaping Use   Vaping status: Never Used  Substance Use Topics   Alcohol use: No   Drug use: Never    Family History  Problem Relation Age of Onset   Breast cancer Mother    Lung cancer Mother    Cancer Maternal Uncle         08/16/2023    2:39 PM 07/12/2023    1:33 PM 05/31/2023    4:01 PM 04/25/2023   11:00 AM  GAD 7 : Generalized Anxiety Score  Nervous, Anxious, on Edge 0 0 0 0  Control/stop worrying 0 0 0 0  Worry too much - different things 0 3 0 0  Trouble relaxing 0 0 3 0  Restless 0 0 0 0  Easily annoyed or irritable 0 0 0 0  Afraid - awful might happen 0 0 0 0  Total GAD 7 Score 0 3 3 0  Anxiety Difficulty Not difficult at all Not difficult at all Not difficult at all Not difficult at all        08/16/2023    2:39 PM 07/12/2023    1:33 PM 05/31/2023    4:01 PM  Depression screen PHQ 2/9  Decreased Interest 0 0 0  Down, Depressed, Hopeless 0 0 0  PHQ - 2 Score 0 0 0  Altered sleeping 0  1  Tired, decreased energy 0  0  Change in appetite 0  0  Feeling bad or failure about yourself  0  0  Trouble concentrating 0  0  Moving slowly or fidgety/restless 0  0  Suicidal thoughts 0  0  PHQ-9 Score 0  1  Difficult doing work/chores Not difficult at all  Not difficult at all    BP Readings from Last 3 Encounters:  07/19/23 128/74  07/12/23 122/70  05/31/23 124/82  Wt Readings from Last 3 Encounters:  08/16/23 276 lb (125.2 kg)  07/19/23 284 lb 3.2 oz (128.9 kg)  07/12/23 288 lb (130.6 kg)    Ht 6' (1.829 m)   Wt 276 lb (125.2 kg) Comment: self-reported  BMI 37.43 kg/m   Physical Exam General: Speaking full sentences, no audible heavy breathing. Sounds alert and appropriately interactive. Well-appearing. Face symmetric. Extraocular movements intact. Pupils equal and round. No nasal flaring or accessory muscle use visualized.  Recent Labs     Component Value Date/Time   NA 139 07/19/2023 1001   NA 138 09/19/2012 0003   K 4.4 07/19/2023 1001   K 3.9 09/19/2012 0003   CL 104 07/19/2023 1001   CL 105 09/19/2012 0003   CO2 21 07/19/2023 1001   CO2 30 09/19/2012 0003   GLUCOSE 84 07/19/2023 1001   GLUCOSE 105 (H) 11/25/2022 0316   GLUCOSE 111 (H) 09/19/2012 0003   BUN 9 07/19/2023 1001   BUN 14 09/19/2012 0003   CREATININE 0.96 07/19/2023 1001   CREATININE 1.00 09/19/2012 0003   CALCIUM  9.2 07/19/2023 1001   CALCIUM  8.4 (L) 09/19/2012 0003   PROT 7.9 11/12/2022 2000   PROT 7.5 05/05/2018 1047   ALBUMIN  4.0 11/12/2022 2000   ALBUMIN  4.5 05/05/2018 1047   AST 23 11/12/2022 2000   ALT 34 11/12/2022 2000   ALKPHOS 50 11/12/2022 2000   BILITOT 0.9 11/12/2022 2000   BILITOT <0.2 05/05/2018 1047   GFRNONAA >60 11/25/2022 0316   GFRNONAA >60 09/19/2012 0003    GFRAA 101 05/05/2018 1047   GFRAA >60 09/19/2012 0003    Lab Results  Component Value Date   WBC 5.7 01/07/2023   HGB 10.8 (L) 01/07/2023   HCT 35.4 (L) 01/07/2023   MCV 79 01/07/2023   PLT 293 01/07/2023   Lab Results  Component Value Date   HGBA1C 6.2 (H) 11/12/2022   Lab Results  Component Value Date   CHOL 106 01/14/2023   HDL 40 01/14/2023   LDLCALC 50 01/14/2023   TRIG 75 01/14/2023   CHOLHDL 2.7 01/14/2023   Lab Results  Component Value Date   TSH 1.991 11/12/2022     Assessment and Plan:  1. Class 2 severe obesity due to excess calories with serious comorbidity and body mass index (BMI) of 37.0 to 37.9 in adult [facility]) Continues to do very well with Wegovy  with a healthy rate of weight loss. Will refill at the same dose. Plan for 1 month f/u in-person.   Advised the importance of adequate protein intake on this medication as well as resistance training which will not only improve insulin  sensitivity but also build muscle mass and reduce risk of atrophy.  Advised importance of portion control and slower eating on this medication to minimize GI side effects. Avoid high fat foods as these may cause discomfort. Patient aware of potential for weight regain when eventually stopping the medication. For this reason, continued efforts toward improving lifestyle will be particularly important moving forward.   - WEGOVY  1 MG/0.5ML SOAJ; Inject 1 mg into the skin once a week.  Dispense: 2 mL; Refill: 0    Return in about 4 weeks (around 09/13/2023) for OV f/u weight mangement.   I discussed the above assessment and treatment plan with the patient. The patient was provided an opportunity to ask questions and all were answered. The patient agreed with the plan and demonstrated an understanding of the instructions. The patient was advised to call back or seek  an in-person evaluation if the symptoms worsen or if the condition fails to improve as anticipated. I provided a total time  of 10 minutes inclusive of time utilized for medical chart review, information gathering, care coordination with staff, and documentation completion.  Cody Das, PA-C, DMSc, Nutritionist [facility] Primary Care and Sports Medicine [facility] [phone]

## 2023-08-19 NOTE — Telephone Encounter (Signed)
 Requested Prescriptions  Pending Prescriptions Disp Refills   losartan  (COZAAR ) 50 MG tablet [Pharmacy Med Name: LOSARTAN  POTASSIUM 50 MG TAB] 90 tablet 1    Sig: TAKE 1 TABLET BY MOUTH EVERY DAY     Cardiovascular:  Angiotensin Receptor Blockers Passed - 08/19/2023  3:22 PM      Passed - Cr in normal range and within 180 days    Creatinine  Date Value Ref Range Status  09/19/2012 1.00 0.60 - 1.30 mg/dL Final   Creatinine, Ser  Date Value Ref Range Status  07/19/2023 0.96 0.76 - 1.27 mg/dL Final         Passed - K in normal range and within 180 days    Potassium  Date Value Ref Range Status  07/19/2023 4.4 3.5 - 5.2 mmol/L Final  09/19/2012 3.9 3.5 - 5.1 mmol/L Final         Passed - Patient is not pregnant      Passed - Last BP in normal range    BP Readings from Last 1 Encounters:  07/19/23 128/74         Passed - Valid encounter within last 6 months    Recent Outpatient Visits           3 days ago Class 2 severe obesity due to excess calories with serious comorbidity and body mass index (BMI) of 37.0 to 37.9 in adult [facility])   [place] [facility] at [facility], Toribio SQUIBB, PA   1 month ago Class 2 severe obesity due to excess calories with serious comorbidity and body mass index (BMI) of 37.0 to 37.9 in adult [facility])   [place] [facility] Medicine at [facility], Toribio SQUIBB, [place]   2 months ago Class 2 severe obesity due to excess calories with serious comorbidity and body mass index (BMI) of 37.0 to 37.9 in adult [facility])   [place] [facility] Sports Medicine at [facility], Toribio SQUIBB, [place]   3 months ago Class 2 severe obesity due to excess calories with serious comorbidity and body mass index (BMI) of 39.0 to 39.9 in adult [facility])   [facility] & [facility] Medicine at [facility], Toribio SQUIBB, [place]       Future Appointments             In 2 months End, Lonni, MD [facility]  HeartCare at [facility]

## 2023-09-20 ENCOUNTER — Ambulatory Visit: Admitting: Physician Assistant

## 2023-09-20 VITALS — BP 138/74 | HR 97 | Temp 99.0°F | Ht 72.0 in | Wt 280.0 lb

## 2023-09-20 DIAGNOSIS — Z1211 Encounter for screening for malignant neoplasm of colon: Secondary | ICD-10-CM

## 2023-09-20 DIAGNOSIS — Z6837 Body mass index (BMI) 37.0-37.9, adult: Secondary | ICD-10-CM

## 2023-09-20 DIAGNOSIS — E66812 Obesity, class 2: Secondary | ICD-10-CM

## 2023-09-20 MED ORDER — WEGOVY 1.7 MG/0.75ML ~~LOC~~ SOAJ: 1.7000 mg | SUBCUTANEOUS | 1 refills | Status: DC

## 2023-09-20 NOTE — Progress Notes (Signed)
 Date:  09/20/2023   Name:  Alexander Simon   DOB:  [birth year]   MRN:  [id number]   Chief Complaint: Weight Check  HPI Alexander Simon presents for 1 month follow-up on weight management currently using Wegovy  1 mg with good tolerance and effect, reports home weight stable at about 276 lb but he is noticing smaller waistline and better fit for his clothing. He is doing well with medication and would like to proceed with dose increase.  He continues with frequent strength training at the gym, and is determined to make long-term changes to preserve his health. He has a stationary bike which he uses 3x per week and it has some resistance bands attached to the handle.   It has been about 10 days since his last dose of Wegovy .   He is excited to share with me that he has a new grandson. He holds family in high regard as a strong motivator to improve his health.   Also recently completed biometric screening at work, will try to get me these results.   He reports a good cardiology visit. As he understands it, they may be stopping 4 of his medications soon; reading their last note, it seems that they only mentioned stopping clopidogrel  after completing 1 year of antiplatelet therapy.   He is also due for colonoscopy. Referred last year, but this fell through.   Medication list has been reviewed and updated.  Current Meds  Medication Sig   amLODipine  (NORVASC ) 5 MG tablet TAKE 1 TABLET (5 MG TOTAL) BY MOUTH DAILY.   aspirin  EC 81 MG tablet Take 1 tablet (81 mg total) by mouth daily. Swallow whole.   carvedilol  (COREG ) 12.5 MG tablet TAKE 1 TABLET (12.5MG  TOTAL) BY MOUTH TWICE A DAY WITH MEALS   clopidogrel  (PLAVIX ) 75 MG tablet TAKE 1 TABLET BY MOUTH EVERY DAY   ezetimibe  (ZETIA ) 10 MG tablet Take 1 tablet (10 mg total) by mouth daily.   JARDIANCE  10 MG TABS tablet TAKE 1 TABLET BY MOUTH EVERY DAY   losartan  (COZAAR ) 50 MG tablet TAKE 1 TABLET BY MOUTH EVERY DAY   naproxen sodium (ALEVE) 220 MG tablet  Take 220 mg by mouth as needed.   sildenafil  (VIAGRA ) 100 MG tablet Take 1 tablet (100 mg total) by mouth daily as needed for erectile dysfunction.   spironolactone  (ALDACTONE ) 25 MG tablet Take 0.5 tablets (12.5 mg total) by mouth daily.   WEGOVY  1.7 MG/0.75ML SOAJ Inject 1.7 mg into the skin once a week.   [DISCONTINUED] WEGOVY  1 MG/0.5ML SOAJ Inject 1 mg into the skin once a week.     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   Coronary artery disease involving native coronary artery of native heart without angina pectoris 04/25/2023   Insomnia 01/07/2023   Anemia 01/07/2023   S/P CABG x 3 11/20/2022   H/O open surgical procurement of radial artery 11/20/2022   Hyperlipidemia LDL goal <70 11/13/2022   History of non-ST elevation myocardial infarction (NSTEMI) 11/12/2022   Class 2 severe obesity with serious comorbidity in adult [facility]) 11/12/2022   Prediabetes 11/12/2022   Mass of right side of neck    Skin tags, multiple acquired    Essential hypertension 02/20/2018   Chest pain 03/09/2017    No Known Allergies  Immunization History  Administered Date(s) Administered   PFIZER(Purple  Top)SARS-COV-2 Vaccination 11/10/2019    Past Surgical History:  Procedure Laterality Date   CIRCUMCISION     CORONARY ARTERY BYPASS GRAFT N/A 11/19/2022   Procedure: CORONARY ARTERY BYPASS GRAFTING (CABG) X THREE  USING LEFT INTERNAL MAMMARY ARTERY, LEFT RADIAL ARTERY, AND ENDOSCOPICALLY HARVESTED RIGHT GREATER SAPHENOUS VEIN;  Surgeon: Shyrl Linnie KIDD, MD;  Location: [facility] OR;  Service: Open Heart Surgery;  Laterality: N/A;   EXCISION MASS NECK Right 06/12/2019   Procedure: EXCISION Superficial MASS NECK;  Surgeon: Marolyn Nest, MD;  Location: [facility] ORS;  Service: General;  Laterality: Right;   EXCISION OF SKIN TAG Bilateral 06/12/2019    Procedure: EXCISION OF SKIN TAG on neck;  Surgeon: Marolyn Nest, MD;  Location: [facility] ORS;  Service: General;  Laterality: Bilateral;   LEFT HEART CATH AND CORONARY ANGIOGRAPHY N/A 11/13/2022   Procedure: LEFT HEART CATH AND CORONARY ANGIOGRAPHY;  Surgeon: Mady Bruckner, MD;  Location: [facility] INVASIVE CV LAB;  Service: Cardiovascular;  Laterality: N/A;   RADIAL ARTERY HARVEST Left 11/19/2022   Procedure: LEFT RADIAL ARTERY HARVEST;  Surgeon: Shyrl Linnie KIDD, MD;  Location: [facility] OR;  Service: Open Heart Surgery;  Laterality: Left;   SHOULDER SURGERY Right 2014   TEE WITHOUT CARDIOVERSION N/A 11/19/2022   Procedure: TRANSESOPHAGEAL ECHOCARDIOGRAM;  Surgeon: Shyrl Linnie KIDD, MD;  Location: [facility] OR;  Service: Open Heart Surgery;  Laterality: N/A;    Social History   Tobacco Use   Smoking status: Never   Smokeless tobacco: Never  Vaping Use   Vaping status: Never Used  Substance Use Topics   Alcohol use: No   Drug use: Never    Family History  Problem Relation Age of Onset   Breast cancer Mother    Lung cancer Mother    Cancer Maternal Uncle         09/20/2023    4:17 PM 08/16/2023    2:39 PM 07/12/2023    1:33 PM 05/31/2023    4:01 PM  GAD 7 : Generalized Anxiety Score  Nervous, Anxious, on Edge 0 0 0 0  Control/stop worrying 0 0 0 0  Worry too much - different things 0 0 3 0  Trouble relaxing 0 0 0 3  Restless 0 0 0 0  Easily annoyed or irritable 0 0 0 0  Afraid - awful might happen 0 0 0 0  Total GAD 7 Score 0 0 3 3  Anxiety Difficulty Not difficult at all Not difficult at all Not difficult at all Not difficult at all       09/20/2023    4:17 PM 08/16/2023    2:39 PM 07/12/2023    1:33 PM  Depression screen PHQ 2/9  Decreased Interest 0 0 0  Down, Depressed, Hopeless 0 0 0  PHQ - 2 Score 0 0 0  Altered sleeping  0   Tired, decreased energy  0   Change in appetite  0   Feeling bad or failure about yourself   0   Trouble concentrating  0   Moving slowly or  fidgety/restless  0   Suicidal thoughts  0   PHQ-9 Score  0   Difficult doing work/chores  Not difficult at all     BP Readings from Last 3 Encounters:  09/20/23 138/74  07/19/23 128/74  07/12/23 122/70    Wt Readings from Last 3 Encounters:  09/20/23 280 lb (127 kg)  08/16/23 276 lb (125.2 kg)  07/19/23 284 lb 3.2 oz (128.9 kg)  BP 138/74   Pulse 97   Temp 99 F (37.2 C)   Ht 6' (1.829 m)   Wt 280 lb (127 kg)   SpO2 97%   BMI 37.97 kg/m   Physical Exam Vitals and nursing note reviewed.  Constitutional:      Appearance: Normal appearance. He is obese.  Cardiovascular:     Rate and Rhythm: Normal rate.  Pulmonary:     Effort: Pulmonary effort is normal.  Abdominal:     General: There is no distension.  Musculoskeletal:        General: Normal range of motion.  Skin:    General: Skin is warm and dry.  Neurological:     Mental Status: He is alert and oriented to person, place, and time.     Gait: Gait is intact.  Psychiatric:        Mood and Affect: Mood and affect normal.     Recent Labs     Component Value Date/Time   NA 139 07/19/2023 1001   NA 138 09/19/2012 0003   K 4.4 07/19/2023 1001   K 3.9 09/19/2012 0003   CL 104 07/19/2023 1001   CL 105 09/19/2012 0003   CO2 21 07/19/2023 1001   CO2 30 09/19/2012 0003   GLUCOSE 84 07/19/2023 1001   GLUCOSE 105 (H) 11/25/2022 0316   GLUCOSE 111 (H) 09/19/2012 0003   BUN 9 07/19/2023 1001   BUN 14 09/19/2012 0003   CREATININE 0.96 07/19/2023 1001   CREATININE 1.00 09/19/2012 0003   CALCIUM  9.2 07/19/2023 1001   CALCIUM  8.4 (L) 09/19/2012 0003   PROT 7.9 11/12/2022 2000   PROT 7.5 05/05/2018 1047   ALBUMIN  4.0 11/12/2022 2000   ALBUMIN  4.5 05/05/2018 1047   AST 23 11/12/2022 2000   ALT 34 11/12/2022 2000   ALKPHOS 50 11/12/2022 2000   BILITOT 0.9 11/12/2022 2000   BILITOT <0.2 05/05/2018 1047   GFRNONAA >60 11/25/2022 0316   GFRNONAA >60 09/19/2012 0003   GFRAA 101 05/05/2018 1047   GFRAA >60  09/19/2012 0003    Lab Results  Component Value Date   WBC 5.7 01/07/2023   HGB 10.8 (L) 01/07/2023   HCT 35.4 (L) 01/07/2023   MCV 79 01/07/2023   PLT 293 01/07/2023   Lab Results  Component Value Date   HGBA1C 6.2 (H) 11/12/2022   Lab Results  Component Value Date   CHOL 106 01/14/2023   HDL 40 01/14/2023   LDLCALC 50 01/14/2023   TRIG 75 01/14/2023   CHOLHDL 2.7 01/14/2023   Lab Results  Component Value Date   TSH 1.991 11/12/2022     Assessment and Plan:  Class 2 severe obesity due to excess calories with serious comorbidity and body mass index (BMI) of 37.0 to 37.9 in adult [facility]) Assessment & Plan: Increase Wegovy  to the 1.7 mg dose.  Reminded that he should never go more than 2 weeks between doses, as this will require restart at the initial dose of 0.25 mg.    Advised the importance of adequate protein intake on this medication as well as resistance training which will not only improve insulin  sensitivity but also build muscle mass and reduce risk of atrophy.  Advised importance of portion control and slower eating on this medication to minimize GI side effects. Avoid high fat foods as these may cause discomfort. Patient aware of potential for weight regain when eventually stopping the medication. For this reason, continued efforts toward improving lifestyle will be particularly  important moving forward.    Orders: -     Wegovy ; Inject 1.7 mg into the skin once a week.  Dispense: 3 mL; Refill: 1  Screening for colon cancer -     Ambulatory referral to Gastroenterology     Return in about 8 weeks (around 11/15/2023) for OV f/u chronic conditions.    Rolan Hoyle, PA-C, DMSc, Nutritionist [facility] Primary Care and Sports Medicine Med[facility] [phone]

## 2023-09-20 NOTE — Assessment & Plan Note (Signed)
 Increase Wegovy  to the 1.7 mg dose.  Reminded that he should never go more than 2 weeks between doses, as this will require restart at the initial dose of 0.25 mg.    Advised the importance of adequate protein intake on this medication as well as resistance training which will not only improve insulin  sensitivity but also build muscle mass and reduce risk of atrophy.  Advised importance of portion control and slower eating on this medication to minimize GI side effects. Avoid high fat foods as these may cause discomfort. Patient aware of potential for weight regain when eventually stopping the medication. For this reason, continued efforts toward improving lifestyle will be particularly important moving forward.

## 2023-09-26 ENCOUNTER — Telehealth: Payer: Self-pay

## 2023-09-26 NOTE — Telephone Encounter (Signed)
 Gastroenterology Pre-Procedure Review  Request Date: TBD after Appt with Dr. Mady (11/15/23) Requesting Physician: Dr. Jinny  PATIENT REVIEW QUESTIONS: The patient responded to the following health history questions as indicated:    1. Are you having any GI issues? no 2. Do you have a personal history of Polyps? no 3. Do you have a family history of Colon Cancer or Polyps? no 4. Diabetes Mellitus? Pt stated no however he does take Jardiance  and Wegovy .  Has been advised that he will need to stop Jardiance  3 days prior and stop Wegovy  7 days prior 5. Joint replacements in the past 12 months?no 6. Major health problems in the past 3 months?Last September 2024 Triple Bypass surgery 7. Any artificial heart valves, MVP, or defibrillator?No, cardiac history CAD, Heart Failure, NSTEMI clearance to be sent to Dr. Mady     MEDICATIONS & ALLERGIES:    Patient reports the following regarding taking any anticoagulation/antiplatelet therapy:   Plavix , Coumadin, Eliquis, Xarelto, Lovenox , Pradaxa, Brilinta, or Effient?Plavix  Advice to be sent to Dr. Mady Aspirin ? yes (81 mg )  Patient confirms/reports the following medications:  Current Outpatient Medications  Medication Sig Dispense Refill   amLODipine  (NORVASC ) 5 MG tablet TAKE 1 TABLET (5 MG TOTAL) BY MOUTH DAILY. 90 tablet 0   aspirin  EC 81 MG tablet Take 1 tablet (81 mg total) by mouth daily. Swallow whole.     carvedilol  (COREG ) 12.5 MG tablet TAKE 1 TABLET (12.5MG  TOTAL) BY MOUTH TWICE A DAY WITH MEALS 180 tablet 0   clopidogrel  (PLAVIX ) 75 MG tablet TAKE 1 TABLET BY MOUTH EVERY DAY 90 tablet 0   ezetimibe  (ZETIA ) 10 MG tablet Take 1 tablet (10 mg total) by mouth daily. 90 tablet 3   JARDIANCE  10 MG TABS tablet TAKE 1 TABLET BY MOUTH EVERY DAY 90 tablet 1   losartan  (COZAAR ) 50 MG tablet TAKE 1 TABLET BY MOUTH EVERY DAY 90 tablet 1   naproxen sodium (ALEVE) 220 MG tablet Take 220 mg by mouth as needed.     sildenafil  (VIAGRA ) 100 MG tablet Take 1  tablet (100 mg total) by mouth daily as needed for erectile dysfunction. 90 tablet 2   spironolactone  (ALDACTONE ) 25 MG tablet Take 0.5 tablets (12.5 mg total) by mouth daily. 45 tablet 3   WEGOVY  1.7 MG/0.75ML SOAJ Inject 1.7 mg into the skin once a week. 3 mL 1   No current facility-administered medications for this visit.    Patient confirms/reports the following allergies:  No Known Allergies  No orders of the defined types were placed in this encounter.   AUTHORIZATION INFORMATION Primary Insurance: 1D#: Group #:  Secondary Insurance: 1D#: Group #:  SCHEDULE INFORMATION: Date: TBD  Time: Location: [facility]

## 2023-10-12 ENCOUNTER — Other Ambulatory Visit: Payer: Self-pay

## 2023-10-12 ENCOUNTER — Emergency Department
Admission: EM | Admit: 2023-10-12 | Discharge: 2023-10-12 | Disposition: A | Discharge status: Home / Self Care | Attending: Emergency Medicine | Admitting: Emergency Medicine

## 2023-10-12 ENCOUNTER — Emergency Department

## 2023-10-12 DIAGNOSIS — I2510 Atherosclerotic heart disease of native coronary artery without angina pectoris: Secondary | ICD-10-CM | POA: Diagnosis not present

## 2023-10-12 DIAGNOSIS — I10 Essential (primary) hypertension: Secondary | ICD-10-CM | POA: Diagnosis not present

## 2023-10-12 DIAGNOSIS — M25512 Pain in left shoulder: Secondary | ICD-10-CM

## 2023-10-12 DIAGNOSIS — M7552 Bursitis of left shoulder: Secondary | ICD-10-CM | POA: Insufficient documentation

## 2023-10-12 HISTORY — DX: Atherosclerotic heart disease of native coronary artery without angina pectoris: I25.10

## 2023-10-12 MED ORDER — METHYLPREDNISOLONE 4 MG PO TBPK: ORAL_TABLET | ORAL | 0 refills | Status: AC

## 2023-10-12 MED ORDER — KETOROLAC TROMETHAMINE 15 MG/ML IJ SOLN
15.0000 mg | Freq: Once | INTRAMUSCULAR | Status: AC
  Administered 2023-10-12: 15 mg via INTRAMUSCULAR
  Filled 2023-10-12: qty 1

## 2023-10-12 MED ORDER — BACLOFEN 10 MG PO TABS: 10.0000 mg | ORAL_TABLET | Freq: Three times a day (TID) | ORAL | 0 refills | Status: AC

## 2023-10-12 NOTE — Discharge Instructions (Signed)
 Follow-up with your regular doctor as needed Follow-up with orthopedics if not improving in 1 week Wear the sling for comfort.  Apply ice.  You do not have to sleep in the sling. Take the medications as prescribed

## 2023-10-12 NOTE — ED Triage Notes (Signed)
 Pt to ED for L arm pain and numbness since yesterday, shoulder to fingers. Pt states may have injured self at work, unsure. Pt would like steroid injection.

## 2023-10-12 NOTE — ED Provider Notes (Signed)
 [facility] Provider Note    Event Date/Time   First MD Initiated Contact with Patient 10/12/23 1335     (approximate)   History   Numbness and Arm Pain   HPI  TRITON HEIDRICH is a 53 y.o. male with history of hypertension, GERD, CAD presents emergency department with left shoulder pain.  Patient works as a Education administrator for [employer].  States he uses that shoulder quite a bit.  Is now having pain and swelling at the joint.  Numbness/tingling from the trapezius to the upper arm.  Denies chest pain/shortness of breath.  Pain is mostly with movement.  States he had a drive back from [place] and that was very painful.  Denies chest pain/shortness of breath      Physical Exam   Triage Vital Signs: ED Triage Vitals  Encounter Vitals Group     BP 10/12/23 1302 (!) 136/90     Girls Systolic BP Percentile --      Girls Diastolic BP Percentile --      Boys Systolic BP Percentile --      Boys Diastolic BP Percentile --      Pulse Rate 10/12/23 1302 85     Resp 10/12/23 1302 16     Temp 10/12/23 1302 98.2 F (36.8 C)     Temp Source 10/12/23 1302 Oral     SpO2 10/12/23 1302 98 %     Weight 10/12/23 1301 271 lb (122.9 kg)     Height 10/12/23 1301 6' (1.829 m)     Head Circumference --      Peak Flow --      Pain Score 10/12/23 1300 10     Pain Loc --      Pain Education --      Exclude from Growth Chart --     Most recent vital signs: Vitals:   10/12/23 1302  BP: (!) 136/90  Pulse: 85  Resp: 16  Temp: 98.2 F (36.8 C)  SpO2: 98%     General: Awake, no distress.   CV:  Good peripheral perfusion.  Resp:  Normal effort. Abd:  No distention.   Other:  Left shoulder with swelling noted along the joint space, decreased range of motion secondary to discomfort, pain is worse with overhead reach, does have good internal rotation, neurovascular intact, no cellulitic areas noted   ED Results / Procedures / Treatments   Labs (all labs ordered are listed,  but only abnormal results are displayed) Labs Reviewed - No data to display   EKG     RADIOLOGY X-ray left shoulder    PROCEDURES:   Procedures  Critical Care:  no Chief Complaint  Patient presents with   Numbness   Arm Pain      MEDICATIONS ORDERED IN ED: Medications  ketorolac  (TORADOL ) 15 MG/ML injection 15 mg (15 mg Intramuscular Given 10/12/23 1351)     IMPRESSION / MDM / ASSESSMENT AND PLAN / ED COURSE  I reviewed the triage vital signs and the nursing notes.                              Differential diagnosis includes, but is not limited to, bursitis, cellulitis, septic joint, fracture, strain  Patient's presentation is most consistent with acute illness / injury with system symptoms.   Medications given: Toradol  15 mg IM  No cellulitic areas and patient does have movement of the joints I  do not feel that cellulitis or septic joint  X-ray left shoulder independently reviewed interpreted by me as being positive for arthritis.  No acute abnormality.  Confirmed by radiology  I did explain these findings to patient.  He is feeling better after Toradol .  Will place him in a sling.  He is to apply ice.  Given Medrol  Dosepak along with a muscle relaxer, baclofen .  Light duty at work for 2 weeks.  Follow-up with orthopedics.  He is in agreement with his treatment plan.  He was discharged stable condition.      FINAL CLINICAL IMPRESSION(S) / ED DIAGNOSES   Final diagnoses:  Bursitis of left shoulder  Acute pain of left shoulder     Rx / DC Orders   ED Discharge Orders          Ordered    methylPREDNISolone  (MEDROL  DOSEPAK) 4 MG TBPK tablet        10/12/23 1507    baclofen  (LIORESAL ) 10 MG tablet  3 times daily        10/12/23 1507             Note:  This document was prepared using Dragon voice recognition software and may include unintentional dictation errors.    Gasper Devere ORN, PA-C 10/12/23 1511    Jacolyn Pae,  MD 10/12/23 [phone]

## 2023-10-14 ENCOUNTER — Telehealth: Payer: Self-pay

## 2023-10-14 NOTE — Transitions of Care (Post Inpatient/ED Visit) (Signed)
   10/14/2023  Name: Alexander Simon MRN: [id number] DOB: 1970/10/19  Today's TOC FU Call Status: Today's TOC FU Call Status:: Unsuccessful Call (1st Attempt)  Attempted to reach the patient regarding the most recent Inpatient/ED visit.  Follow Up Plan: Additional outreach attempts will be made to reach the patient to complete the Transitions of Care (Post Inpatient/ED visit) call.   [employer], CMA

## 2023-10-14 NOTE — Transitions of Care (Post Inpatient/ED Visit) (Signed)
 10/14/2023  Name: Alexander Simon MRN: [id number] DOB: March 06, 1970  Today's TOC FU Call Status: Today's TOC FU Call Status:: Successful TOC FU Call Completed Unsuccessful Call (1st Attempt) Date: 10/14/23 [facility] FU Call Complete Date: 10/14/23 Patient's Name and Date of Birth confirmed.  Transition Care Management Follow-up Telephone Call Date of Discharge: 10/12/23 Discharge Facility: [facility]) Type of Discharge: Emergency Department Reason for ED Visit: Other: (Bursitis of left shoulder) How have you been since you were released from the hospital?: Same Any questions or concerns?: No  Items Reviewed: Did you receive and understand the discharge instructions provided?: Yes Medications obtained,verified, and reconciled?: Yes (Medications Reviewed) Any new allergies since your discharge?: No Dietary orders reviewed?: NA Do you have support at home?: Yes  Medications Reviewed Today: Medications Reviewed Today     Reviewed by Jaramiah Bossard, Steva SAILOR, CMA (Certified Medical Assistant) on 10/14/23 at 1634  Med List Status: <None>   Medication Order Taking? Sig Documenting Provider Last Dose Status Informant  amLODipine  (NORVASC ) 5 MG tablet [id number]  TAKE 1 TABLET (5 MG TOTAL) BY MOUTH DAILY. Manya Toribio SQUIBB, PA  Active   aspirin  EC 81 MG tablet [id number]  Take 1 tablet (81 mg total) by mouth daily. Swallow whole. Furth, Cadence H, PA-C  Active   baclofen  (LIORESAL ) 10 MG tablet [id number]  Take 1 tablet (10 mg total) by mouth 3 (three) times daily for 7 days. Gasper Devere ORN, PA-C  Active   carvedilol  (COREG ) 12.5 MG tablet [id number]  TAKE 1 TABLET (12.5MG  TOTAL) BY MOUTH TWICE A DAY WITH MEALS Manya Toribio SQUIBB, [place]  Active   clopidogrel  (PLAVIX ) 75 MG tablet [id number]  TAKE 1 TABLET BY MOUTH EVERY DAY Manya Toribio SQUIBB, PA  Active   ezetimibe  (ZETIA ) 10 MG tablet [id number]  Take 1 tablet (10 mg total) by mouth daily. Mady Bruckner, MD  Active   JARDIANCE  10  MG TABS tablet [id number]  TAKE 1 TABLET BY MOUTH EVERY DAY Manya Toribio SQUIBB, PA  Active   losartan  (COZAAR ) 50 MG tablet [id number]  TAKE 1 TABLET BY MOUTH EVERY DAY Manya Toribio SQUIBB, PA  Active   methylPREDNISolone  (MEDROL  DOSEPAK) 4 MG TBPK tablet [id number]  Take 6 pills on day one then decrease by 1 pill each day Gasper Devere ORN, PA-C  Active   naproxen sodium (ALEVE) 220 MG tablet [id number]  Take 220 mg by mouth as needed. [provider]  Active   sildenafil  (VIAGRA ) 100 MG tablet [id number]  Take 1 tablet (100 mg total) by mouth daily as needed for erectile dysfunction. Manya Toribio SQUIBB, PA  Active   spironolactone  (ALDACTONE ) 25 MG tablet [id number]  Take 0.5 tablets (12.5 mg total) by mouth daily. Furth, Cadence H, PA-C  Expired 09/20/23 2359   WEGOVY  1.7 MG/0.75ML SOAJ [id number]  Inject 1.7 mg into the skin once a week. Manya Toribio SQUIBB, PA  Active             Home Care and Equipment/Supplies: Were Home Health Services Ordered?: NA Any new equipment or medical supplies ordered?: NA  Functional Questionnaire: Do you need assistance with bathing/showering or dressing?: No Do you need assistance with meal preparation?: No Do you need assistance with eating?: No Do you have difficulty maintaining continence: No Do you need assistance with getting out of bed/getting out of a chair/moving?: No  Follow up appointments reviewed: PCP Follow-up appointment confirmed?: Yes Follow-up Provider: Selinda Ku Do you need transportation to your follow-up appointment?:  No Do you understand care options if your condition(s) worsen?: Yes-patient verbalized understanding    SIGNATURE Steva Lesta Limbert, CMA

## 2023-10-14 NOTE — Telephone Encounter (Signed)
 Called pt scheduled an appointment with Dr. Alvia. Pt stated he would like to get an injection.  Copied from CRM #[id number]. Topic: General - Call Back - No Documentation >> Oct 14, 2023  3:26 PM Charlet HERO wrote: Reason for CRM: Patient is calling to return call please call patient back he will be working tomorrow and wold like to discuss his health

## 2023-10-17 ENCOUNTER — Encounter: Payer: Self-pay | Admitting: Physician Assistant

## 2023-10-17 ENCOUNTER — Encounter: Admitting: Family Medicine

## 2023-10-18 ENCOUNTER — Ambulatory Visit: Admitting: Family Medicine

## 2023-10-18 ENCOUNTER — Ambulatory Visit: Payer: Self-pay

## 2023-10-18 ENCOUNTER — Encounter: Payer: Self-pay | Admitting: Family Medicine

## 2023-10-18 ENCOUNTER — Other Ambulatory Visit (INDEPENDENT_AMBULATORY_CARE_PROVIDER_SITE_OTHER): Payer: Self-pay | Admitting: Radiology

## 2023-10-18 VITALS — BP 130/70 | HR 100 | Ht 72.0 in | Wt 280.0 lb

## 2023-10-18 DIAGNOSIS — M7542 Impingement syndrome of left shoulder: Secondary | ICD-10-CM | POA: Diagnosis not present

## 2023-10-18 MED ORDER — TRIAMCINOLONE ACETONIDE 40 MG/ML IJ SUSP
40.0000 mg | Freq: Once | INTRAMUSCULAR | Status: AC
Start: 2023-10-18 — End: 2023-10-18
  Administered 2023-10-18: 40 mg via INTRAMUSCULAR

## 2023-10-18 MED ORDER — CYCLOBENZAPRINE HCL 5 MG PO TABS: 5.0000 mg | ORAL_TABLET | Freq: Three times a day (TID) | ORAL | 0 refills | Status: AC | PRN

## 2023-10-18 NOTE — Assessment & Plan Note (Signed)
 History of Present Illness Alexander Simon is a 53 year old male with heart disease who presents with left shoulder pain and numbness.  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 - Pain localized to the lateral aspect of the left shoulder - Pain radiates down the arm with associated numbness in the thumb - Associated swelling, numbness, and tingling in the affected area - Pain disrupts sleep, causing nocturnal awakenings - Pain was severe enough to affect his stay in [place] - No prior episodes of similar pain - No prior surgeries on the left shoulder - No chronic shoulder pain prior to this incident  Pain management and medication use - Aleve provided some relief, allowing sleep at night - Tylenol  was ineffective  Upper extremity neurological symptoms - Numbness and tingling in the left arm and thumb, new since onset of shoulder pain  Neck pain - Neck pain present with head movement, localized to the posterior neck  Handedness and functional status - Right-handed but able to use both hands  Relevant surgical history - History of rotator cuff surgery on the right shoulder  Physical Exam LEFT SHOULDER / ELBOW PALPATION: Subacromial space, bicipital groove, AC joint, upper trapezius, levator scapula, and rhomboids along medial scapular border all nontender; mild tenderness at proximal third of extensor forearm; minimal tenderness at lateral epicondyle RANGE OF MOTION: Painful limited left ROM with overhead STRENGTH: Isolated supraspinatus testing on the left elicits maximal discomfort with 5 -/5 strength, otherwise 5/5 strength throughout, symmetric and painless bilaterally SPECIAL TESTS: Hawkins test positive; Neer's test negative on the left; Speed's test negative; Yergason's (Ferguson's) test negative  Results RADIOLOGY Shoulder X-ray: Normal glenohumeral joint space, cortical roughening about the  acromion which can be clinically associated with subacromial impingement, normal acromioclavicular (AC) joint, maintained anatomic alignment, no acute osseous processes (10/12/2023)  Assessment and Plan Left shoulder rotator cuff injury (supraspinatus involvement) in the setting of subacromial impingement. Acute left shoulder pain with supraspinatus involvement, exacerbated by work-related overhead activities and necessary reaching. X-rays show subacromial impingement without acute bony abnormalities. Severe pain unresponsive to NSAIDs and use of NSAIDs are limited due to cardiac history. - Administer cortisone injection to left shoulder using ultrasound guidance to reduce inflammation and pain. - Advise rest for two days post-injection, limiting activities to essential tasks. - Educate on avoiding use of a sling to prevent frozen shoulder. - Start home-based exercises with information provided via MyChart after this weekend. - Contact us  at the 2-week mark or beyond for any persistent symptoms.  Early left lateral epicondylitis (tennis elbow) Early findings of left lateral epicondylitis, likely secondary to compensatory overuse due to shoulder dysfunction.  Left upper extremity numbness (possible median nerve involvement) Numbness in left upper extremity, possibly related to median nerve involvement or cervical radiculopathy. Symptoms may be exacerbated by shoulder dysfunction and neck muscle tension. - Prescribe medications safe for cardiac history to address nerve-related symptoms.  Heart disease (post-cardiac surgery) Heart disease post-cardiac surgery. NSAIDs contraindicated due to cardiac risk. - Avoid NSAIDs for pain management due to cardiac risk.

## 2023-10-18 NOTE — Telephone Encounter (Signed)
 FYI Only or Action Required?: Action required by provider: request for appointment.  Patient was last seen in primary care on 09/20/2023 by Manya Toribio SQUIBB, PA.  Called Nurse Triage reporting Shoulder Pain.  Symptoms began a week ago.  Interventions attempted: Prescription medications: steroid and muscle relaxer.  Symptoms are: gradually worsening.  Triage Disposition: See Physician Within 24 Hours  Patient/caregiver understands and will follow disposition?: YesCopied from CRM #[id number]. Topic: Clinical - Red Word Triage >> Oct 18, 2023  1:19 PM Alexander Simon wrote: Red Word that prompted transfer to Nurse Triage: Shoulder/arm pain Reason for Disposition  Numbness (i.e., loss of sensation) in hand or fingers  Answer Assessment - Initial Assessment Questions Pt went to ED for shoulder pain on 8/16. Pt missed sport medicine appt yesterday. Pain is nagging and sharp. Pain Comes and goes. Pt was given steroid dose and muscle relaxer. Pt has muscle relaxer left. CAL notified due to gray area of acute flare up vs hospital f/u. Office schedule pt appt with Dr Alvia today at 1600.       1. ONSET: When did the pain start?     8/16 2. LOCATION: Where is the pain located?     Left shoulder 3. PAIN: How bad is the pain? (Scale 1-10; or mild, moderate, severe)     20 4. WORK OR EXERCISE: Has there been any recent work or exercise that involved this part of the body?     na 5. CAUSE: What do you think is causing the shoulder pain?     Not sure 6. OTHER SYMPTOMS: Do you have any other symptoms? (e.g., neck pain, swelling, rash, fever, numbness, weakness)     Left thumb numb- not new  Protocols used: Shoulder Pain-A-AH

## 2023-10-18 NOTE — Progress Notes (Signed)
 Primary Care / Sports Medicine Office Visit  Patient Information:  Patient ID: Alexander Simon, male DOB: Aug 08, 1970 Age: 53 y.o. MRN: [id number]   Alexander Simon is a pleasant 53 y.o. male presenting with the following:  Chief Complaint  Patient presents with   Shoulder Pain    Vitals:   10/18/23 1539  BP: 130/70  Pulse: 100  SpO2: 99%   Vitals:   10/18/23 1539  Weight: 280 lb (127 kg)  Height: 6' (1.829 m)   Body mass index is 37.97 kg/m.  DG Shoulder Left Result Date: 10/12/2023 CLINICAL DATA:  Left arm pain and swelling EXAM: LEFT SHOULDER - 2+ VIEW COMPARISON:  None Available. FINDINGS: No acute fractures or dislocation. Joint spaces are preserved with degenerative spurring of the greater tuberosity and acromion process. IMPRESSION: No acute osseous findings. Electronically Signed   By: Michaeline Blanch M.D.   On: 10/12/2023 14:17     Independent interpretation of notes and tests performed by another provider:   None  Procedures performed:   Procedure:  Injection of left shoulder under ultrasound guidance. Ultrasound guidance utilized for in-plane subacromial approach, supraspinatus visualized  Samsung HS60 device utilized with permanent recording / reporting. Verbal informed consent obtained and verified. Skin prepped in a sterile fashion. Ethyl chloride for topical local analgesia.  Completed without difficulty and tolerated well. Medication: triamcinolone  acetonide 40 mg/mL suspension for injection 1 mL total and 2 mL lidocaine  1% without epinephrine  utilized for needle placement anesthetic Advised to contact for fevers/chills, erythema, induration, drainage, or persistent bleeding.   Pertinent History, Exam, Impression, and Recommendations:   Problem List Items Addressed This Visit     Subacromial impingement, left - Primary   History of Present Illness Alexander Simon is a 53 year old male with heart disease who presents with left shoulder pain and  numbness.  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 - Pain localized to the lateral aspect of the left shoulder - Pain radiates down the arm with associated numbness in the thumb - Associated swelling, numbness, and tingling in the affected area - Pain disrupts sleep, causing nocturnal awakenings - Pain was severe enough to affect his stay in [place] - No prior episodes of similar pain - No prior surgeries on the left shoulder - No chronic shoulder pain prior to this incident  Pain management and medication use - Aleve provided some relief, allowing sleep at night - Tylenol  was ineffective  Upper extremity neurological symptoms - Numbness and tingling in the left arm and thumb, new since onset of shoulder pain  Neck pain - Neck pain present with head movement, localized to the posterior neck  Handedness and functional status - Right-handed but able to use both hands  Relevant surgical history - History of rotator cuff surgery on the right shoulder  Physical Exam LEFT SHOULDER / ELBOW PALPATION: Subacromial space, bicipital groove, AC joint, upper trapezius, levator scapula, and rhomboids along medial scapular border all nontender; mild tenderness at proximal third of extensor forearm; minimal tenderness at lateral epicondyle RANGE OF MOTION: Painful limited left ROM with overhead STRENGTH: Isolated supraspinatus testing on the left elicits maximal discomfort with 5 -/5 strength, otherwise 5/5 strength throughout, symmetric and painless bilaterally SPECIAL TESTS: Hawkins test positive; Neer's test negative on the left; Speed's test negative; Yergason's (Ferguson's) test negative  Results RADIOLOGY Shoulder X-ray: Normal glenohumeral joint space, cortical roughening about the acromion which  can be clinically associated with subacromial impingement, normal acromioclavicular (AC) joint, maintained anatomic  alignment, no acute osseous processes (10/12/2023)  Assessment and Plan Left shoulder rotator cuff injury (supraspinatus involvement) in the setting of subacromial impingement. Acute left shoulder pain with supraspinatus involvement, exacerbated by work-related overhead activities and necessary reaching. X-rays show subacromial impingement without acute bony abnormalities. Severe pain unresponsive to NSAIDs and use of NSAIDs are limited due to cardiac history. - Administer cortisone injection to left shoulder using ultrasound guidance to reduce inflammation and pain. - Advise rest for two days post-injection, limiting activities to essential tasks. - Educate on avoiding use of a sling to prevent frozen shoulder. - Start home-based exercises with information provided via MyChart after this weekend. - Contact us  at the 2-week mark or beyond for any persistent symptoms.  Early left lateral epicondylitis (tennis elbow) Early findings of left lateral epicondylitis, likely secondary to compensatory overuse due to shoulder dysfunction.  Left upper extremity numbness (possible median nerve involvement) Numbness in left upper extremity, possibly related to median nerve involvement or cervical radiculopathy. Symptoms may be exacerbated by shoulder dysfunction and neck muscle tension. - Prescribe medications safe for cardiac history to address nerve-related symptoms.  Heart disease (post-cardiac surgery) Heart disease post-cardiac surgery. NSAIDs contraindicated due to cardiac risk. - Avoid NSAIDs for pain management due to cardiac risk.      Relevant Medications   cyclobenzaprine  (FLEXERIL ) 5 MG tablet   Other Relevant Orders   US  LIMITED JOINT SPACE STRUCTURES UP LEFT     Orders & Medications Medications:  Meds ordered this encounter  Medications   triamcinolone  acetonide (KENALOG -40) injection 40 mg   cyclobenzaprine  (FLEXERIL ) 5 MG tablet    Sig: Take 1-2 tablets (5-10 mg total) by mouth  3 (three) times daily as needed for muscle spasms.    Dispense:  60 tablet    Refill:  0   Orders Placed This Encounter  Procedures   US  LIMITED JOINT SPACE STRUCTURES UP LEFT     No follow-ups on file.     Selinda JINNY Ku, MD, [facility]   Primary Care Sports Medicine Primary Care and Sports Medicine at [facility]

## 2023-10-18 NOTE — Patient Instructions (Signed)
 Patient Plan for Post-Visit Guidance  Left Shoulder Rotator Cuff Injury - Receive cortisone injection to the left shoulder. - Rest the shoulder for two days after the injection, limiting activities to essential tasks only. - Do not use a sling to avoid developing frozen shoulder. - Begin home-based shoulder exercises after this weekend using instructions provided via MyChart. - Contact the office at the two-week mark or later if symptoms persist.  Heart Disease - Limit/avoid NSAIDs (ibuprofen  or naproxen) for pain management.  Red Flags - If you experience severe shoulder pain, swelling, redness, fever, loss of shoulder movement, chest pain, or shortness of breath, seek medical attention promptly.

## 2023-10-18 NOTE — Telephone Encounter (Signed)
 Noted  Pt has a appt.  KP

## 2023-10-22 ENCOUNTER — Other Ambulatory Visit: Payer: Self-pay | Admitting: Physician Assistant

## 2023-10-23 NOTE — Telephone Encounter (Signed)
 Requested Prescriptions  Pending Prescriptions Disp Refills   clopidogrel  (PLAVIX ) 75 MG tablet [Pharmacy Med Name: CLOPIDOGREL  75 MG TABLET] 90 tablet 0    Sig: TAKE 1 TABLET BY MOUTH EVERY DAY     Hematology: Antiplatelets - clopidogrel  Failed - 10/23/2023  2:40 PM      Failed - HCT in normal range and within 180 days    Hematocrit  Date Value Ref Range Status  01/07/2023 35.4 (L) 37.5 - 51.0 % Final         Failed - HGB in normal range and within 180 days    Hemoglobin  Date Value Ref Range Status  01/07/2023 10.8 (L) 13.0 - 17.7 g/dL Final         Failed - PLT in normal range and within 180 days    Platelets  Date Value Ref Range Status  01/07/2023 293 150 - 450 x10E3/uL Final         Passed - Cr in normal range and within 360 days    Creatinine  Date Value Ref Range Status  09/19/2012 1.00 0.60 - 1.30 mg/dL Final   Creatinine, Ser  Date Value Ref Range Status  07/19/2023 0.96 0.76 - 1.27 mg/dL Final         Passed - Valid encounter within last 6 months    Recent Outpatient Visits           5 days ago Subacromial impingement, left   [place] [facility], Selinda PARAS, MD   1 month ago Class 2 severe obesity due to excess calories with serious comorbidity and body mass index (BMI) of 37.0 to 37.9 in adult [facility])   [place] [facility] at [facility], Toribio SQUIBB, [place]   2 months ago Class 2 severe obesity due to excess calories with serious comorbidity and body mass index (BMI) of 37.0 to 37.9 in adult [facility])   [place] [facility] at [facility], Toribio SQUIBB, [place]   3 months ago Class 2 severe obesity due to excess calories with serious comorbidity and body mass index (BMI) of 37.0 to 37.9 in adult [facility])    Primary Care & Sports Medicine at [facility], Toribio SQUIBB, PA   4 months ago Class 2 severe obesity due to excess calories with serious  comorbidity and body mass index (BMI) of 37.0 to 37.9 in adult [facility])   [facility] at [facility], Toribio SQUIBB, [place]       Future Appointments             In 3 weeks End, Lonni, MD [facility]             carvedilol  (COREG ) 12.5 MG tablet [Pharmacy Med Name: CARVEDILOL  12.5 MG TABLET] 180 tablet 0    Sig: TAKE 1 TABLET (12.5MG  TOTAL) BY MOUTH TWICE A DAY WITH MEALS     Cardiovascular: Beta Blockers 3 Passed - 10/23/2023  2:40 PM      Passed - Cr in normal range and within 360 days    Creatinine  Date Value Ref Range Status  09/19/2012 1.00 0.60 - 1.30 mg/dL Final   Creatinine, Ser  Date Value Ref Range Status  07/19/2023 0.96 0.76 - 1.27 mg/dL Final         Passed - AST in normal range and within 360 days  AST  Date Value Ref Range Status  11/12/2022 23 15 - 41 U/L Final         Passed - ALT in normal range and within 360 days    ALT  Date Value Ref Range Status  11/12/2022 34 0 - 44 U/L Final         Passed - Last BP in normal range    BP Readings from Last 1 Encounters:  10/18/23 130/70         Passed - Last Heart Rate in normal range    Pulse Readings from Last 1 Encounters:  10/18/23 100         Passed - Valid encounter within last 6 months    Recent Outpatient Visits           5 days ago Subacromial impingement, left   [place] [facility], Selinda PARAS, MD   1 month ago Class 2 severe obesity due to excess calories with serious comorbidity and body mass index (BMI) of 37.0 to 37.9 in adult [facility])   [place] [facility] at [facility], Toribio SQUIBB, [place]   2 months ago Class 2 severe obesity due to excess calories with serious comorbidity and body mass index (BMI) of 37.0 to 37.9 in adult [facility])   [place] [facility] at [facility], Toribio SQUIBB, [place]   3 months ago Class 2 severe  obesity due to excess calories with serious comorbidity and body mass index (BMI) of 37.0 to 37.9 in adult [facility])   [place] [facility] at [facility], Toribio SQUIBB, [place]   4 months ago Class 2 severe obesity due to excess calories with serious comorbidity and body mass index (BMI) of 37.0 to 37.9 in adult [facility])   [place] [facility] at [facility], Toribio SQUIBB, [place]       Future Appointments             In 3 weeks End, Lonni, MD [place] [facility]             amLODipine  (NORVASC ) 5 MG tablet [Pharmacy Med Name: AMLODIPINE  BESYLATE 5 MG TAB] 90 tablet 0    Sig: TAKE 1 TABLET (5 MG TOTAL) BY MOUTH DAILY.     Cardiovascular: Calcium  Channel Blockers 2 Passed - 10/23/2023  2:40 PM      Passed - Last BP in normal range    BP Readings from Last 1 Encounters:  10/18/23 130/70         Passed - Last Heart Rate in normal range    Pulse Readings from Last 1 Encounters:  10/18/23 100         Passed - Valid encounter within last 6 months    Recent Outpatient Visits           5 days ago Subacromial impingement, left   [place] [facility] at [facility], Selinda PARAS, MD   1 month ago Class 2 severe obesity due to excess calories with serious comorbidity and body mass index (BMI) of 37.0 to 37.9 in adult [facility])    Primary Care & Sports Medicine at [facility], Toribio SQUIBB, [place]   2 months ago Class 2 severe obesity due to excess calories with serious comorbidity and body mass index (BMI) of 37.0 to 37.9 in  adult [facility])   [place] [facility] at [facility], Toribio SQUIBB, [place]   3 months ago Class 2 severe obesity due to excess calories with serious comorbidity and body mass index (BMI) of 37.0 to 37.9 in adult [facility])   [place] [facility] at [facility], Toribio SQUIBB, [place]   4 months ago Class 2  severe obesity due to excess calories with serious comorbidity and body mass index (BMI) of 37.0 to 37.9 in adult [facility])   [facility] Medicine at [facility], Toribio SQUIBB, [place]       Future Appointments             In 3 weeks End, Lonni, MD [facility] at [facility]

## 2023-10-30 ENCOUNTER — Telehealth: Payer: Self-pay | Admitting: Physician Assistant

## 2023-10-30 NOTE — Telephone Encounter (Signed)
 Please review.  KP

## 2023-10-30 NOTE — Telephone Encounter (Signed)
 Faxed J[facility]

## 2023-10-30 NOTE — Telephone Encounter (Signed)
 Copied from CRM #[id number]. Topic: Medical Record Request - Records Request >> Oct 30, 2023 10:36 AM Zebedee SAUNDERS wrote: Reason for CRM: Pt is requesting medical records sent to: [facility] per Ms. Armand ph: [phone] fax: [phone] claim # [id number] for workman's comp Aug. 13th, 2025.

## 2023-11-05 ENCOUNTER — Ambulatory Visit (INDEPENDENT_AMBULATORY_CARE_PROVIDER_SITE_OTHER): Admitting: Family Medicine

## 2023-11-05 ENCOUNTER — Encounter: Payer: Self-pay | Admitting: Family Medicine

## 2023-11-05 VITALS — BP 116/72 | HR 90 | Ht 72.0 in | Wt 272.0 lb

## 2023-11-05 DIAGNOSIS — G5602 Carpal tunnel syndrome, left upper limb: Secondary | ICD-10-CM | POA: Diagnosis not present

## 2023-11-05 DIAGNOSIS — M7542 Impingement syndrome of left shoulder: Secondary | ICD-10-CM

## 2023-11-05 NOTE — Progress Notes (Signed)
 Primary Care / Sports Medicine Office Visit  Patient Information:  Patient ID: Alexander Simon, male DOB: 1970-08-25 Age: 53 y.o. MRN: [id number]   Alexander Simon is a pleasant 53 y.o. male presenting with the following:  Chief Complaint  Patient presents with   Shoulder Pain    Doesn't hurt, does not wear the sleeve anymore, feels fine, still having numbness and tingling at thumb, wears a wrist brace for carpel tunnel     Vitals:   11/05/23 1613  BP: 116/72  Pulse: 90  SpO2: 98%   Vitals:   11/05/23 1613  Weight: 272 lb (123.4 kg)  Height: 6' (1.829 m)   Body mass index is 36.89 kg/m.     Discussed the use of AI scribe software for clinical note transcription with the patient, who gave verbal consent to proceed.   Independent interpretation of notes and tests performed by another provider:   None  Procedures performed:   None  Pertinent History, Exam, Impression, and Recommendations:   Problem List Items Addressed This Visit     Carpal tunnel syndrome on left   Carpal tunnel syndrome and neuropathic symptoms - Brings up chronic issue, ongoing symptoms - Numbness and tingling in the wrist and thumb, persistent even when not wearing a brace - Symptoms relieved by wearing a wrist brace - History of carpal tunnel syndrome in both wrists, confirmed by nerve conduction testing several years ago - Occasional pain radiating from the wrist - Wrist brace used for symptom management  LEFT WRIST SPECIAL TESTS: Provocative testing for lateral epicondylitis negative. Tinel's test at volar wrist equivocal, with transient paresthesias in the first digit. Phalen's test negative  Left carpal tunnel syndrome Symptoms include numbness and tingling in the left thumb, with transient paresthesias elicited by Tinel's test. Previous nerve conduction studies confirmed diagnosis. Symptoms may be exacerbated by increased wrist use due to shoulder issues. - Consider nerve conduction  testing if symptoms worsen. - Discuss potential for injection around the median nerve in the carpal tunnel area if needed, contact us  for scheduling.      Subacromial impingement, left - Primary   History of Present Illness Alexander Simon is a 53 year old male with left shoulder pain and carpal tunnel syndrome who presents for follow-up of shoulder symptoms.  Left shoulder pain and mechanical symptoms - Left shoulder pain with associated clicking and popping sounds - Discomfort when lying on the left side, which disrupts sleep - No pain with overhead reaching or when putting on a shirt - Prior shoulder injection provided relief after approximately one week - Currently awaiting workman's compensation decision; medical records requested  Pain management - Uses Flexeril  (cyclobenzaprine ) and Aleve (naproxen) as needed for pain, but use has not been frequent as symptoms have improved  LEFT SHOULDER RANGE OF MOTION: Full external and internal rotation with 5/5 strength, painless STRENGTH: 5/5 strength in supraspinatus, painless SPECIAL TESTS: Neer's test negative. Hawkins test positive  Left shoulder impingement syndrome Residual impingement indicated by positive Hawkins test despite significant improvement. - Send shoulder exercises through MyChart. - Advise continuation of light duty work for a total of four weeks. - Instruct to use medications like Flexeril  and Aleve only if shoulder pain occurs. - Advise to contact if shoulder pain persists despite medication for next steps. (Formal physical therapy, MRI, etc)        Orders & Medications Medications: No orders of the defined types were placed in this encounter.  No  orders of the defined types were placed in this encounter.    No follow-ups on file.     Selinda JINNY Ku, MD, [facility]   Primary Care Sports Medicine [facility] at [facility]

## 2023-11-05 NOTE — Assessment & Plan Note (Signed)
 Carpal tunnel syndrome and neuropathic symptoms - Brings up chronic issue, ongoing symptoms - Numbness and tingling in the wrist and thumb, persistent even when not wearing a brace - Symptoms relieved by wearing a wrist brace - History of carpal tunnel syndrome in both wrists, confirmed by nerve conduction testing several years ago - Occasional pain radiating from the wrist - Wrist brace used for symptom management  LEFT WRIST SPECIAL TESTS: Provocative testing for lateral epicondylitis negative. Tinel's test at volar wrist equivocal, with transient paresthesias in the first digit. Phalen's test negative  Left carpal tunnel syndrome Symptoms include numbness and tingling in the left thumb, with transient paresthesias elicited by Tinel's test. Previous nerve conduction studies confirmed diagnosis. Symptoms may be exacerbated by increased wrist use due to shoulder issues. - Consider nerve conduction testing if symptoms worsen. - Discuss potential for injection around the median nerve in the carpal tunnel area if needed, contact us  for scheduling.

## 2023-11-05 NOTE — Assessment & Plan Note (Signed)
 History of Present Illness Alexander Simon is a 53 year old male with left shoulder pain and carpal tunnel syndrome who presents for follow-up of shoulder symptoms.  Left shoulder pain and mechanical symptoms - Left shoulder pain with associated clicking and popping sounds - Discomfort when lying on the left side, which disrupts sleep - No pain with overhead reaching or when putting on a shirt - Prior shoulder injection provided relief after approximately one week - Currently awaiting workman's compensation decision; medical records requested  Pain management - Uses Flexeril  (cyclobenzaprine ) and Aleve (naproxen) as needed for pain, but use has not been frequent as symptoms have improved  LEFT SHOULDER RANGE OF MOTION: Full external and internal rotation with 5/5 strength, painless STRENGTH: 5/5 strength in supraspinatus, painless SPECIAL TESTS: Neer's test negative. Hawkins test positive  Left shoulder impingement syndrome Residual impingement indicated by positive Hawkins test despite significant improvement. - Send shoulder exercises through MyChart. - Advise continuation of light duty work for a total of four weeks. - Instruct to use medications like Flexeril  and Aleve only if shoulder pain occurs. - Advise to contact if shoulder pain persists despite medication for next steps. (Formal physical therapy, MRI, etc)

## 2023-11-05 NOTE — Patient Instructions (Signed)
 Left carpal tunnel syndrome  - Continue using your wrist brace for symptom relief. - If numbness, tingling, or pain gets worse, consider repeat nerve conduction testing. - If symptoms do not improve or become more bothersome, contact the office to discuss possible injection treatment. - Home exercises sent via MyChart.  Left shoulder impingement syndrome  - Continue light duty work for a total of four weeks. - Use Flexeril  (cyclobenzaprine ) and Aleve (naproxen) only if you have shoulder pain. - Follow the shoulder exercises sent through MyChart. - Contact the office if shoulder pain continues despite medication, for possible next steps such as formal physical therapy or MRI.  Red flags - Seek care right away if you have:  - Severe or sudden weakness in your hand, arm, or shoulder - Loss of feeling in your hand or arm - Inability to move your hand, wrist, or shoulder - Signs of infection (redness, swelling, warmth, fever)

## 2023-11-07 ENCOUNTER — Encounter: Payer: Self-pay | Admitting: Physician Assistant

## 2023-11-07 ENCOUNTER — Ambulatory Visit: Admitting: Physician Assistant

## 2023-11-07 ENCOUNTER — Telehealth: Payer: Self-pay | Admitting: Physician Assistant

## 2023-11-07 VITALS — BP 102/78 | HR 98 | Ht 72.0 in | Wt 272.0 lb

## 2023-11-07 DIAGNOSIS — M7542 Impingement syndrome of left shoulder: Secondary | ICD-10-CM | POA: Diagnosis not present

## 2023-11-07 DIAGNOSIS — I252 Old myocardial infarction: Secondary | ICD-10-CM

## 2023-11-07 DIAGNOSIS — E66812 Obesity, class 2: Secondary | ICD-10-CM

## 2023-11-07 DIAGNOSIS — Z6837 Body mass index (BMI) 37.0-37.9, adult: Secondary | ICD-10-CM

## 2023-11-07 MED ORDER — WEGOVY 1.7 MG/0.75ML ~~LOC~~ SOAJ: 1.7000 mg | SUBCUTANEOUS | 1 refills | Status: DC

## 2023-11-07 NOTE — Telephone Encounter (Signed)
 Pt has a appt today.  KP

## 2023-11-07 NOTE — Progress Notes (Signed)
 Date:  11/07/2023   Name:  Alexander Simon   DOB:  February 18, 1971   MRN:  [id number]   Chief Complaint: Medical Management of Chronic Issues  HPI Alexander Simon presents for 2 month follow-up on weight management currently using Wegovy  1.7 mg with good tolerance and effect, reports weight continues to decline and he is noticing smaller waistline and better fit for his clothing.  According to our scale, he is down an additional 8 pounds compared to last visit, down 25 pounds since initiating Wegovy  in March. He is doing well with medication and would like to stay at this dose for now.  He continues with frequent strength training at the gym, and is determined to make long-term changes to preserve his health. He has a stationary bike which he uses 3x per week and it has some resistance bands attached to the handle.   His newly born grandson is doing well. He holds family in high regard as a strong motivator to improve his health.   He reports a good cardiology visit in May. As he understands it, they may be stopping 4 of his medications soon; reading their last note, it seems that they only mentioned stopping clopidogrel  after completing 1 year of antiplatelet therapy. I could see other medications being discontinued however.   He asks for a work note specifying specific restrictions with recent dx of left CTS and subacromial impingement.    Medication list has been reviewed and updated.  Current Meds  Medication Sig   amLODipine  (NORVASC ) 5 MG tablet TAKE 1 TABLET (5 MG TOTAL) BY MOUTH DAILY.   aspirin  EC 81 MG tablet Take 1 tablet (81 mg total) by mouth daily. Swallow whole.   carvedilol  (COREG ) 12.5 MG tablet TAKE 1 TABLET (12.5MG  TOTAL) BY MOUTH TWICE A DAY WITH MEALS   clopidogrel  (PLAVIX ) 75 MG tablet TAKE 1 TABLET BY MOUTH EVERY DAY   cyclobenzaprine  (FLEXERIL ) 5 MG tablet Take 1-2 tablets (5-10 mg total) by mouth 3 (three) times daily as needed for muscle spasms.   ezetimibe  (ZETIA ) 10 MG  tablet Take 1 tablet (10 mg total) by mouth daily.   JARDIANCE  10 MG TABS tablet TAKE 1 TABLET BY MOUTH EVERY DAY   losartan  (COZAAR ) 50 MG tablet TAKE 1 TABLET BY MOUTH EVERY DAY   methylPREDNISolone  (MEDROL  DOSEPAK) 4 MG TBPK tablet Take 6 pills on day one then decrease by 1 pill each day   naproxen sodium (ALEVE) 220 MG tablet Take 220 mg by mouth as needed.   sildenafil  (VIAGRA ) 100 MG tablet Take 1 tablet (100 mg total) by mouth daily as needed for erectile dysfunction.   spironolactone  (ALDACTONE ) 25 MG tablet Take 0.5 tablets (12.5 mg total) by mouth daily.   [DISCONTINUED] WEGOVY  1.7 MG/0.75ML SOAJ Inject 1.7 mg into the skin once a week.     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/20/2022   H/O open surgical procurement of radial artery 11/20/2022   Hyperlipidemia LDL goal <70 11/13/2022   History of non-ST elevation myocardial infarction (NSTEMI) 11/12/2022   Class 2 severe obesity with serious  comorbidity in adult Alexander Simon) 11/12/2022   Prediabetes 11/12/2022   Mass of right side of neck    Skin tags, multiple acquired    Essential hypertension 02/20/2018   Chest pain 03/09/2017    No Known Allergies  Immunization History  Administered Date(s) Administered   PFIZER(Purple Top)SARS-COV-2 Vaccination 11/10/2019    Past Surgical History:  Procedure Laterality Date   CIRCUMCISION     CORONARY ARTERY BYPASS GRAFT N/A 11/19/2022   Procedure: CORONARY ARTERY BYPASS GRAFTING (CABG) X THREE  USING LEFT INTERNAL MAMMARY ARTERY, LEFT RADIAL ARTERY, AND ENDOSCOPICALLY  HARVESTED RIGHT GREATER SAPHENOUS VEIN;  Surgeon: Shyrl Linnie KIDD, MD;  Location: [facility] OR;  Service: Open Heart Surgery;  Laterality: N/A;   EXCISION MASS NECK Right 06/12/2019   Procedure: EXCISION Superficial MASS NECK;  Surgeon: Marolyn Nest, MD;  Location: [facility] ORS;  Service: General;  Laterality: Right;   EXCISION OF SKIN TAG Bilateral 06/12/2019   Procedure: EXCISION OF SKIN TAG on neck;  Surgeon: Marolyn Nest, MD;  Location: [facility] ORS;  Service: General;  Laterality: Bilateral;   LEFT HEART CATH AND CORONARY ANGIOGRAPHY N/A 11/13/2022   Procedure: LEFT HEART CATH AND CORONARY ANGIOGRAPHY;  Surgeon: Mady Bruckner, MD;  Location: [facility] INVASIVE CV LAB;  Service: Cardiovascular;  Laterality: N/A;   RADIAL ARTERY HARVEST Left 11/19/2022   Procedure: LEFT RADIAL ARTERY HARVEST;  Surgeon: Shyrl Linnie KIDD, MD;  Location: [facility] OR;  Service: Open Heart Surgery;  Laterality: Left;   SHOULDER SURGERY Right 2014   TEE WITHOUT CARDIOVERSION N/A 11/19/2022   Procedure: TRANSESOPHAGEAL ECHOCARDIOGRAM;  Surgeon: Shyrl Linnie KIDD, MD;  Location: [facility] OR;  Service: Open Heart Surgery;  Laterality: N/A;    Social History   Tobacco Use   Smoking status: Never   Smokeless tobacco: Never  Vaping Use   Vaping status: Never Used  Substance Use Topics   Alcohol use: No   Drug use: Never    Family History  Problem Relation Age of Onset   Breast cancer Mother    Lung cancer Mother    Cancer Maternal Uncle         09/20/2023    4:17 PM 08/16/2023    2:39 PM 07/12/2023    1:33 PM 05/31/2023    4:01 PM  GAD 7 : Generalized Anxiety Score  Nervous, Anxious, on Edge 0 0 0 0  Control/stop worrying 0 0 0 0  Worry too much - different things 0 0 3 0  Trouble relaxing 0 0 0 3  Restless 0 0 0 0  Easily annoyed or irritable 0 0 0 0  Afraid - awful might happen 0 0 0 0  Total GAD 7 Score 0 0 3 3  Anxiety Difficulty Not difficult at all Not difficult at all Not difficult at all Not difficult at  all       10/18/2023    3:45 PM 09/20/2023    4:17 PM 08/16/2023    2:39 PM  Depression screen PHQ 2/9  Decreased Interest 0 0 0  Down, Depressed, Hopeless 0 0 0  PHQ - 2 Score 0 0 0  Altered sleeping   0  Tired, decreased energy   0  Change in appetite   0  Feeling bad or failure about yourself    0  Trouble concentrating   0  Moving slowly or fidgety/restless   0  Suicidal thoughts   0  PHQ-9 Score   0  Difficult doing work/chores   Not difficult at  all    BP Readings from Last 3 Encounters:  11/07/23 102/78  11/05/23 116/72  10/18/23 130/70    Wt Readings from Last 3 Encounters:  11/07/23 272 lb (123.4 kg)  11/05/23 272 lb (123.4 kg)  10/18/23 280 lb (127 kg)    BP 102/78   Pulse 98   Ht 6' (1.829 m)   Wt 272 lb (123.4 kg)   SpO2 97%   BMI 36.89 kg/m   Physical Exam Vitals and nursing note reviewed.  Constitutional:      Appearance: Normal appearance. He is obese.  Cardiovascular:     Rate and Rhythm: Normal rate.  Pulmonary:     Effort: Pulmonary effort is normal.  Abdominal:     General: There is no distension.  Musculoskeletal:        General: Normal range of motion.  Skin:    General: Skin is warm and dry.  Neurological:     Mental Status: He is alert and oriented to person, place, and time.     Gait: Gait is intact.  Psychiatric:        Mood and Affect: Mood and affect normal.     Recent Labs     Component Value Date/Time   NA 139 07/19/2023 1001   NA 138 09/19/2012 0003   K 4.4 07/19/2023 1001   K 3.9 09/19/2012 0003   CL 104 07/19/2023 1001   CL 105 09/19/2012 0003   CO2 21 07/19/2023 1001   CO2 30 09/19/2012 0003   GLUCOSE 84 07/19/2023 1001   GLUCOSE 105 (H) 11/25/2022 0316   GLUCOSE 111 (H) 09/19/2012 0003   BUN 9 07/19/2023 1001   BUN 14 09/19/2012 0003   CREATININE 0.96 07/19/2023 1001   CREATININE 1.00 09/19/2012 0003   CALCIUM  9.2 07/19/2023 1001   CALCIUM  8.4 (L) 09/19/2012 0003   PROT 7.9 11/12/2022 2000   PROT 7.5  05/05/2018 1047   ALBUMIN  4.0 11/12/2022 2000   ALBUMIN  4.5 05/05/2018 1047   AST 23 11/12/2022 2000   ALT 34 11/12/2022 2000   ALKPHOS 50 11/12/2022 2000   BILITOT 0.9 11/12/2022 2000   BILITOT <0.2 05/05/2018 1047   GFRNONAA >60 11/25/2022 0316   GFRNONAA >60 09/19/2012 0003   GFRAA 101 05/05/2018 1047   GFRAA >60 09/19/2012 0003    Lab Results  Component Value Date   WBC 5.7 01/07/2023   HGB 10.8 (L) 01/07/2023   HCT 35.4 (L) 01/07/2023   MCV 79 01/07/2023   PLT 293 01/07/2023   Lab Results  Component Value Date   HGBA1C 6.2 (H) 11/12/2022   HGBA1C 6.3 (H) 05/05/2018   HGBA1C 6.1 (H) 03/09/2017   Lab Results  Component Value Date   CHOL 106 01/14/2023   HDL 40 01/14/2023   LDLCALC 50 01/14/2023   TRIG 75 01/14/2023   CHOLHDL 2.7 01/14/2023   Lab Results  Component Value Date   TSH 1.991 11/12/2022      Assessment and Plan:  Class 2 severe obesity due to excess calories with serious comorbidity and body mass index (BMI) of 37.0 to 37.9 in adult [place] [facility]) Assessment & Plan: Continue Wegovy  at the 1.7 mg dose.    Advised the importance of adequate protein intake on this medication as well as resistance training which will not only improve insulin  sensitivity but also build muscle mass and reduce risk of atrophy.  Advised importance of portion control and slower eating on this medication to minimize GI side effects. Avoid high  fat foods as these may cause discomfort. Patient aware of potential for weight regain when eventually stopping the medication. For this reason, continued efforts toward improving lifestyle will be particularly important moving forward.    Orders: -     Wegovy ; Inject 1.7 mg into the skin once a week.  Dispense: 3 mL; Refill: 1  Subacromial impingement, left Assessment & Plan: Work note written with patient during clinic today.  It was read back to him before printing.   History of non-ST elevation myocardial infarction (NSTEMI) Assessment  & Plan: Follow-up with cardiology scheduled for 12/20/2023.  Will leave adjustments to pharmacotherapy to them, but it sounds like he should at least be able to stop Plavix  following that visit, and perhaps a few other medications.      Return in about 8 weeks (around 12/31/2023) for OV f/u Wegovy .    Rolan Hoyle, PA-C, DMSc, Nutritionist [facility] [phone]

## 2023-11-07 NOTE — Telephone Encounter (Signed)
 I handled this for him today.

## 2023-11-07 NOTE — Telephone Encounter (Signed)
 Please review and write new work note with specific restrictions per patient request.   JM

## 2023-11-07 NOTE — Telephone Encounter (Signed)
 Copied from CRM #[id number]. Topic: General - Other >> Nov 07, 2023 12:33 PM Alexander Simon wrote: Pt calling to find out if work restriction letter can be processed asap. His job is in danger if he can't have it processed quickly

## 2023-11-07 NOTE — Assessment & Plan Note (Signed)
 Continue Wegovy  at the 1.7 mg dose.    Advised the importance of adequate protein intake on this medication as well as resistance training which will not only improve insulin  sensitivity but also build muscle mass and reduce risk of atrophy.  Advised importance of portion control and slower eating on this medication to minimize GI side effects. Avoid high fat foods as these may cause discomfort. Patient aware of potential for weight regain when eventually stopping the medication. For this reason, continued efforts toward improving lifestyle will be particularly important moving forward.

## 2023-11-07 NOTE — Assessment & Plan Note (Signed)
 Follow-up with cardiology scheduled for 12/20/2023.  Will leave adjustments to pharmacotherapy to them, but it sounds like he should at least be able to stop Plavix  following that visit, and perhaps a few other medications.

## 2023-11-07 NOTE — Assessment & Plan Note (Signed)
 Work note written with patient during clinic today.  It was read back to him before printing.

## 2023-11-07 NOTE — Telephone Encounter (Signed)
 Please review.  KP

## 2023-11-14 ENCOUNTER — Telehealth: Payer: Self-pay | Admitting: Physician Assistant

## 2023-11-14 NOTE — Telephone Encounter (Signed)
 Called [employer]. Let them know that the letter is saying that the patient can do his job. His job title is called [name].  KP

## 2023-11-14 NOTE — Telephone Encounter (Signed)
 Copied from CRM #[id number]. Topic: General - Other >> Nov 07, 2023 12:33 PM [name] wrote: Pt calling to find out if work restriction letter can be processed asap. His job is in danger if he can't have it processed quickly >> Nov 14, 2023  3:50 PM [name] wrote: [name] has a questions about this sentence in [place] return to work letter. It is my medical opinion that my patient Alexander Simon can [facility] for 8 hours per day Monday through Friday for the next 4 weeks with no weight restrictions.  What does [name] mean? Please call her back at [phone].

## 2023-11-14 NOTE — Telephone Encounter (Signed)
 Copied from CRM #[id number]. Topic: General - Other >> Nov 07, 2023 12:33 PM [name] wrote: Pt calling to find out if work restriction letter can be processed asap. His job is in danger if he can't have it processed quickly >> Nov 14, 2023  3:50 PM [name] wrote: Alexander Simon has a questions about this sentence in [place] return to work letter. It is my medical opinion that my patient Alexander Simon can [facility] for 8 hours per day Monday through Friday for the next 4 weeks with no weight restrictions.  What does [name] mean? Please call her back at [phone].

## 2023-11-15 ENCOUNTER — Ambulatory Visit: Admitting: Physician Assistant

## 2023-11-15 ENCOUNTER — Ambulatory Visit: Admitting: Internal Medicine

## 2023-12-20 ENCOUNTER — Ambulatory Visit: Attending: Internal Medicine | Admitting: Internal Medicine

## 2023-12-20 NOTE — Progress Notes (Deleted)
  Cardiology Office Note:  .   Date:  12/20/2023  ID:  Alexander Simon, DOB Aug 09, 1970, MRN [id number] PCP: Manya Toribio SQUIBB, PA  [facility] Providers Cardiologist:  Lonni Hanson, MD { Click to update primary MD,subspecialty MD or APP then REFRESH:1}    History of Present Illness: .   Alexander Simon is a 53 y.o. 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  I last saw him in May, at which time he was feeling well other than a single episode of transient shortness of breath that occurred while he was feeling particularly hot at work.  He denied accompanying symptoms and had been able to exercise regularly without any limitations.  We did not make any medication changes or pursue additional testing.  ROS: See HPI  Studies Reviewed: .        *** Risk Assessment/Calculations:   {Does this patient have ATRIAL FIBRILLATION?[phone]} No BP recorded.  {Refresh Note OR Click here to enter BP  :1}***       Physical Exam:   VS:  There were no vitals taken for this visit.   Wt Readings from Last 3 Encounters:  11/07/23 272 lb (123.4 kg)  11/05/23 272 lb (123.4 kg)  10/18/23 280 lb (127 kg)    General:  NAD. Neck: No JVD or HJR. Lungs: Clear to auscultation bilaterally without wheezes or crackles. Heart: Regular rate and rhythm without murmurs, rubs, or gallops. Abdomen: Soft, nontender, nondistended. Extremities: No lower extremity edema.  ASSESSMENT AND PLAN: .    ***    {Are you ordering a CV Procedure (e.g. stress test, cath, DCCV, TEE, etc)?   Press F2        :[phone]}  Dispo: ***  Signed, Lonni Hanson, MD

## 2023-12-23 ENCOUNTER — Encounter: Payer: Self-pay | Admitting: Internal Medicine

## 2024-01-08 ENCOUNTER — Ambulatory Visit: Admitting: Internal Medicine

## 2024-01-09 NOTE — Progress Notes (Unsigned)
 Cardiology Office Note   Date:  01/10/2024  ID:  Alexander Simon, DOB 04-22-1970, MRN [id number] PCP: Manya Toribio SQUIBB, PA  [facility] Providers Cardiologist:  Lonni Hanson, MD   History of Present Illness Alexander Simon is a 53 y.o. male with a hx of hypertension, obesity, CAD s/p CABG x 3, HFmrEF, ICM, prediabetes who is being seen for follow-up of CAD.    The patient [facility] to [facility] 11[facility]12/22 with chest pain. HS troponin elevated to 700s. Echo showed LVEF 45-50%, moderate LVH, G2DD. Cardiac cath showed severe 3V CAD and he was transferred to [facility] for CABG consult. The patient underwent CABG x3 using LIMA to LAD, Radial artery to OM and SVG to diagonal on 9/23. Post-op patient required some IV diuresis. He was started on Plavix  for NSTEMI. He was transitioned to Coreg , Entresto , and Jardiance . Spiro was held for elevated K, but later re-trialed.    The patient was seen 12/2022 and was overall doing well form a cardiac perspective. Limited echo was ordered. This showed LVEF 50-55%, moderate LVH, normal RVSF, mild MR.  He was last seen 06/2023 and was overall feeling well from a cardiac perspective.  He reported mild dyspnea.  Today, the patient is overall doing well. he denies chest pain, SOB, lower leg edema, orthopnea, pnd, lightheadedness, palpitations.  He has been taking Wegovy  for weight loss and weight has gone down 300lbs-260s.  Studies Reviewed      Limited Echo 01/2023 1. Left ventricular ejection fraction, by estimation, is 50 to 55%. The  left ventricle has low normal function. The left ventricle has no regional  wall motion abnormalities. There is moderate left ventricular hypertrophy.  Left ventricular diastolic  parameters were normal.   2. Right ventricular systolic function is normal. The right ventricular  size is normal.   3. The mitral valve is normal in structure. Mild mitral valve  regurgitation. No evidence of mitral stenosis.   4. The aortic valve  has an indeterminant number of cusps. Aortic valve  regurgitation is not visualized. No aortic stenosis is present.   5. The inferior vena cava is normal in size with greater than 50%  respiratory variability, suggesting right atrial pressure of 3 mmHg.    LHC 10/2022 Conclusions: Multivessel CAD, including 50% ostial LMCA stenosis, mild luminal irregularities involving the LAD, 90% ostial and mid/distal LCx lesions, and sequential 50-60% RCA stenoses. LVEDP 18 mmHg.   Recommendations: Transfer to [facility] for CABG evaluation.  Ostial LCx lesion would be very difficult to treat percutaneously due to angulation of the vessel relative to the LMCA and LAD.  Additionally, there is at least moderate, bordering on severe ostial LMCA and sequential mid/distal RCA disease. Resume heparin  infusion 2 hours after TR band removal. Aggressive secondary prevention of CAD.   Lonni Hanson, MD [facility] Coronary Diagrams   Diagnostic Dominance: Right      Echo 10/2022  1. Left ventricular ejection fraction, by estimation, is 45 to 50%. The  left ventricle has mildly decreased function. The left ventricle  demonstrates regional wall motion abnormalities (see scoring  diagram/findings for description). There is moderate  left ventricular hypertrophy. Left ventricular diastolic parameters are  consistent with Grade II diastolic dysfunction (pseudonormalization).  Elevated left atrial pressure. There is severe hypokinesis of the left  ventricular, basal-mid inferior wall and  inferolateral wall.   2. Right ventricular systolic function is normal. The right ventricular  size is normal. Tricuspid regurgitation signal is inadequate for assessing  PA pressure.   3. The mitral valve is degenerative. Trivial mitral valve regurgitation.  No evidence of mitral stenosis.   4. The aortic valve was not well visualized. Aortic valve regurgitation  is not visualized. No aortic stenosis is present.         Physical Exam VS:  BP 136/76   Pulse 74   Ht 6' (1.829 m)   Wt 272 lb 12.8 oz (123.7 kg)   SpO2 97%   BMI 37.00 kg/m        Wt Readings from Last 3 Encounters:  01/10/24 272 lb 12.8 oz (123.7 kg)  11/07/23 272 lb (123.4 kg)  11/05/23 272 lb (123.4 kg)    GEN: Well nourished, well developed in no acute distress NECK: No JVD; No carotid bruits CARDIAC: RRR, no murmurs, rubs, gallops RESPIRATORY:  Clear to auscultation without rales, wheezing or rhonchi  ABDOMEN: Soft, non-tender, non-distended EXTREMITIES:  No edema; No deformity   ASSESSMENT AND PLAN  CAD NSTEMI 10/2022 The patient denies anginal symptoms. Since it has been 1 year post-stent we will stop plavix . Continue ASA, Coreg , and Zetia   HFimpEF The patient is euvolemic on exam. Echo 01/2023 showed LVEF 50-55%. Continue spironolactone , Jardiance , Losartan  and Coreg .   HTN BP is good today. Continue Losartan , spironolactone , amlodipine , Coreg . Patient has been losing weight.   HLD LDL 50, TG 75. Repeat lipid panel/LFTS today. Continue Zetia .  Obesity He is on wegovy  for weight loss. Weight has gone from 300lbs>260s.     Dispo: follow-up in 6 months  Signed, Cutter Passey VEAR Fishman, PA-C

## 2024-01-10 ENCOUNTER — Ambulatory Visit: Attending: Medical | Admitting: Medical

## 2024-01-10 ENCOUNTER — Encounter: Payer: Self-pay | Admitting: Medical

## 2024-01-10 VITALS — BP 136/76 | HR 74 | Ht 72.0 in | Wt 272.8 lb

## 2024-01-10 DIAGNOSIS — E66812 Obesity, class 2: Secondary | ICD-10-CM

## 2024-01-10 DIAGNOSIS — Z79899 Other long term (current) drug therapy: Secondary | ICD-10-CM

## 2024-01-10 DIAGNOSIS — I10 Essential (primary) hypertension: Secondary | ICD-10-CM | POA: Diagnosis not present

## 2024-01-10 DIAGNOSIS — E782 Mixed hyperlipidemia: Secondary | ICD-10-CM

## 2024-01-10 DIAGNOSIS — I5020 Unspecified systolic (congestive) heart failure: Secondary | ICD-10-CM

## 2024-01-10 DIAGNOSIS — I2510 Atherosclerotic heart disease of native coronary artery without angina pectoris: Secondary | ICD-10-CM

## 2024-01-10 NOTE — Patient Instructions (Signed)
 Medication Instructions:  Your physician recommends the following medication changes.  STOP TAKING: Plavix   *If you need a refill on your cardiac medications before your next appointment, please call your pharmacy*  Lab Work: Your provider would like for you to have following labs drawn today CBC.     Testing/Procedures: No test ordered today   Follow-Up: At [facility], you and your health needs are our priority.  As part of our continuing mission to provide you with exceptional heart care, our providers are all part of one team.  This team includes your primary Cardiologist (physician) and Advanced Practice Providers or APPs (Physician Assistants and Nurse Practitioners) who all work together to provide you with the care you need, when you need it.  Your next appointment:   6 month(s)  Provider:   Lonni Hanson, MD or Cadence Franchester, PA-C

## 2024-01-11 LAB — LIPID PANEL
Chol/HDL Ratio: 3.9 ratio (ref 0.0–5.0)
Cholesterol, Total: 180 mg/dL (ref 100–199)
HDL: 46 mg/dL (ref >39)
LDL Chol Calc (NIH): 108 mg/dL — ABNORMAL HIGH (ref 0–99)
Triglycerides: 147 mg/dL (ref 0–149)
VLDL Cholesterol Cal: 26 mg/dL (ref 5–40)

## 2024-01-11 LAB — HEPATIC FUNCTION PANEL
ALT: 31 IU/L (ref 0–44)
AST: 21 IU/L (ref 0–40)
Albumin: 4 g/dL (ref 3.8–4.9)
Alkaline Phosphatase: 81 IU/L (ref 47–123)
Bilirubin Total: 0.2 mg/dL (ref 0.0–1.2)
Bilirubin, Direct: <0.08 mg/dL (ref 0.00–0.40)
Total Protein: 6.8 g/dL (ref 6.0–8.5)

## 2024-01-11 LAB — CBC
Hematocrit: 41.7 % (ref 37.5–51.0)
Hemoglobin: 12.9 g/dL — ABNORMAL LOW (ref 13.0–17.7)
MCH: 25.2 pg — ABNORMAL LOW (ref 26.6–33.0)
MCHC: 30.9 g/dL — ABNORMAL LOW (ref 31.5–35.7)
MCV: 82 fL (ref 79–97)
Platelets: 276 x10E3/uL (ref 150–450)
RBC: 5.11 x10E6/uL (ref 4.14–5.80)
RDW: 13.3 % (ref 11.6–15.4)
WBC: 7.1 x10E3/uL (ref 3.4–10.8)

## 2024-01-13 ENCOUNTER — Ambulatory Visit: Payer: Self-pay | Admitting: Internal Medicine

## 2024-01-13 DIAGNOSIS — Z79899 Other long term (current) drug therapy: Secondary | ICD-10-CM

## 2024-01-14 ENCOUNTER — Telehealth: Payer: Self-pay

## 2024-01-14 NOTE — Telephone Encounter (Signed)
 Please complete PA for Wegovy  1.7 MG.  KP

## 2024-01-15 ENCOUNTER — Other Ambulatory Visit (HOSPITAL_COMMUNITY): Payer: Self-pay

## 2024-01-15 ENCOUNTER — Telehealth: Payer: Self-pay | Admitting: Pharmacy Technician

## 2024-01-15 MED ORDER — ATORVASTATIN CALCIUM 40 MG PO TABS: 40.0000 mg | ORAL_TABLET | Freq: Every day | ORAL | 3 refills | Status: AC

## 2024-01-15 NOTE — Telephone Encounter (Signed)
 Pharmacy Patient Advocate Encounter   Received notification from Pt Calls Messages that prior authorization for Wegovy  1.7MG /0.75ML auto-injectors is required/requested.   Insurance verification completed.   The patient is insured through [facility].   Per test claim: PA required; PA started via CoverMyMeds. KEY [id number] . Waiting for clinical questions to populate.

## 2024-01-15 NOTE — Telephone Encounter (Signed)
 Pharmacy Patient Advocate Encounter  Received notification from [facility] that Prior Authorization for Wegovy  1.7MG /0.75ML auto-injectors has been APPROVED from 01/15/24 to 01/14/25. Unable to obtain price due to refill too soon rejection, last fill date 01/15/24 next available fill date12/10/25   PA #/Case ID/Reference #: [id number]

## 2024-01-15 NOTE — Telephone Encounter (Signed)
 PA request has been Received. New Encounter has been or will be created for follow up. For additional info see Pharmacy Prior Auth telephone encounter from 01/15/24.

## 2024-01-15 NOTE — Telephone Encounter (Signed)
 Received notification from [facility] that Prior Authorization for Wegovy  1.7MG /0.75ML auto-injectors has been APPROVED from 01/15/24 to 01/14/25.

## 2024-02-03 ENCOUNTER — Other Ambulatory Visit: Payer: Self-pay | Admitting: Physician Assistant

## 2024-02-04 NOTE — Telephone Encounter (Signed)
 Requested Prescriptions  Pending Prescriptions Disp Refills   carvedilol  (COREG ) 12.5 MG tablet [Pharmacy Med Name: CARVEDILOL  12.5 MG TABLET] 180 tablet 1    Sig: TAKE 1 TABLET (12.5MG  TOTAL) BY MOUTH TWICE A DAY WITH MEALS     Cardiovascular: Beta Blockers 3 Passed - 02/04/2024  5:23 PM      Passed - Cr in normal range and within 360 days    Creatinine  Date Value Ref Range Status  09/19/2012 1.00 0.60 - 1.30 mg/dL Final   Creatinine, Ser  Date Value Ref Range Status  07/19/2023 0.96 0.76 - 1.27 mg/dL Final         Passed - AST in normal range and within 360 days    AST  Date Value Ref Range Status  01/10/2024 21 0 - 40 IU/L Final         Passed - ALT in normal range and within 360 days    ALT  Date Value Ref Range Status  01/10/2024 31 0 - 44 IU/L Final         Passed - Last BP in normal range    BP Readings from Last 1 Encounters:  01/10/24 136/76         Passed - Last Heart Rate in normal range    Pulse Readings from Last 1 Encounters:  01/10/24 74         Passed - Valid encounter within last 6 months    Recent Outpatient Visits           2 months ago Class 2 severe obesity due to excess calories with serious comorbidity and body mass index (BMI) of 37.0 to 37.9 in adult   [facility] Primary Care & Sports Medicine at [facility], Toribio SQUIBB, [place]   3 months ago Subacromial impingement, left   [place] [facility] & Sports Medicine at [facility], Selinda PARAS, [place]   3 months ago Subacromial impingement, left   [place] [facility] Care & Sports Medicine at [facility], Selinda PARAS, MD   4 months ago Class 2 severe obesity due to excess calories with serious comorbidity and body mass index (BMI) of 37.0 to 37.9 in adult   [facility] Primary Care & Sports Medicine at [facility], Toribio SQUIBB, [place]   5 months ago Class 2 severe obesity due to excess calories with serious comorbidity and body mass index (BMI) of 37.0 to 37.9  in adult   [facility] Primary Care & Sports Medicine at [facility], Toribio SQUIBB, [place]

## 2024-02-10 ENCOUNTER — Other Ambulatory Visit: Payer: Self-pay | Admitting: Physician Assistant

## 2024-02-12 MED ORDER — SPIRONOLACTONE 25 MG PO TABS: 12.5000 mg | ORAL_TABLET | Freq: Every day | ORAL | 3 refills | Status: AC

## 2024-02-12 MED ORDER — EZETIMIBE 10 MG PO TABS: 10.0000 mg | ORAL_TABLET | Freq: Every day | ORAL | 3 refills | Status: AC

## 2024-02-12 NOTE — Telephone Encounter (Signed)
 Requested Prescriptions  Pending Prescriptions Disp Refills   losartan  (COZAAR ) 50 MG tablet [Pharmacy Med Name: LOSARTAN  POTASSIUM 50 MG TAB] 90 tablet 0    Sig: TAKE 1 TABLET BY MOUTH EVERY DAY     Cardiovascular:  Angiotensin Receptor Blockers Failed - 02/12/2024 12:59 PM      Failed - Cr in normal range and within 180 days    Creatinine  Date Value Ref Range Status  09/19/2012 1.00 0.60 - 1.30 mg/dL Final   Creatinine, Ser  Date Value Ref Range Status  07/19/2023 0.96 0.76 - 1.27 mg/dL Final         Failed - K in normal range and within 180 days    Potassium  Date Value Ref Range Status  07/19/2023 4.4 3.5 - 5.2 mmol/L Final  09/19/2012 3.9 3.5 - 5.1 mmol/L Final         Passed - Patient is not pregnant      Passed - Last BP in normal range    BP Readings from Last 1 Encounters:  01/10/24 136/76         Passed - Valid encounter within last 6 months    Recent Outpatient Visits           3 months ago Class 2 severe obesity due to excess calories with serious comorbidity and body mass index (BMI) of 37.0 to 37.9 in adult   [facility] & [facility] Medicine at [facility], Toribio SQUIBB, [place]   3 months ago Subacromial impingement, left   [place] [facility] & Sports Medicine at [facility], Selinda PARAS, [place]   3 months ago Subacromial impingement, left   [place] [facility] Care & Sports Medicine at [facility], Selinda PARAS, MD   4 months ago Class 2 severe obesity due to excess calories with serious comorbidity and body mass index (BMI) of 37.0 to 37.9 in adult   [facility] Primary Care & Sports Medicine at [facility], Toribio SQUIBB, [place]   6 months ago Class 2 severe obesity due to excess calories with serious comorbidity and body mass index (BMI) of 37.0 to 37.9 in adult   [facility] Care & Sports Medicine at [facility], Toribio SQUIBB, [place]               amLODipine  (NORVASC ) 5 MG tablet [Pharmacy Med  Name: AMLODIPINE  BESYLATE 5 MG TAB] 90 tablet 0    Sig: TAKE 1 TABLET (5 MG TOTAL) BY MOUTH DAILY.     Cardiovascular: Calcium  Channel Blockers 2 Passed - 02/12/2024 12:59 PM      Passed - Last BP in normal range    BP Readings from Last 1 Encounters:  01/10/24 136/76         Passed - Last Heart Rate in normal range    Pulse Readings from Last 1 Encounters:  01/10/24 74         Passed - Valid encounter within last 6 months    Recent Outpatient Visits           3 months ago Class 2 severe obesity due to excess calories with serious comorbidity and body mass index (BMI) of 37.0 to 37.9 in adult   [facility] Primary Care & Sports Medicine at [facility], Toribio SQUIBB, [place]   3 months ago Subacromial impingement, left   [facility] Primary Care & Sports Medicine at [facility], Selinda PARAS, MD   3  months ago Subacromial impingement, left    Primary Care & Sports Medicine at [facility], Selinda PARAS, MD   4 months ago Class 2 severe obesity due to excess calories with serious comorbidity and body mass index (BMI) of 37.0 to 37.9 in adult   [facility] Primary Care & Sports Medicine at [facility], Toribio SQUIBB, [place]   6 months ago Class 2 severe obesity due to excess calories with serious comorbidity and body mass index (BMI) of 37.0 to 37.9 in adult   [facility] Primary Care & Sports Medicine at [facility], Toribio SQUIBB, [place]               JARDIANCE  10 MG TABS tablet [Pharmacy Med Name: JARDIANCE  10 MG TABLET] 90 tablet 0    Sig: TAKE 1 TABLET BY MOUTH EVERY DAY     Endocrinology:  Diabetes - SGLT2 Inhibitors Failed - 02/12/2024 12:59 PM      Failed - HBA1C is between 0 and 7.9 and within 180 days    Hgb A1c MFr Bld  Date Value Ref Range Status  11/12/2022 6.2 (H) 4.8 - 5.6 % Final    Comment:    (NOTE)         Prediabetes: 5.7 - 6.4         Diabetes: >6.4         Glycemic control for adults with diabetes: <7.0           Passed - Cr in normal range and within 360 days    Creatinine  Date Value Ref Range Status  09/19/2012 1.00 0.60 - 1.30 mg/dL Final   Creatinine, Ser  Date Value Ref Range Status  07/19/2023 0.96 0.76 - 1.27 mg/dL Final         Passed - eGFR in normal range and within 360 days    EGFR (African American)  Date Value Ref Range Status  09/19/2012 >60  Final   GFR calc Af Amer  Date Value Ref Range Status  05/05/2018 101 >59 mL/min/1.73 Final   EGFR (Non-African Amer.)  Date Value Ref Range Status  09/19/2012 >60  Final    Comment:    eGFR values <2mL/min/1.73 m2 may be an indication of chronic kidney disease (CKD). Calculated eGFR is useful in patients with stable renal function. The eGFR calculation will not be reliable in acutely ill patients when serum creatinine is changing rapidly. It is not useful in  patients on dialysis. The eGFR calculation may not be applicable to patients at the low and high extremes of body sizes, pregnant women, and vegetarians.    GFR, Estimated  Date Value Ref Range Status  11/25/2022 >60 >60 mL/min Final    Comment:    (NOTE) Calculated using the CKD-EPI Creatinine Equation (2021)    eGFR  Date Value Ref Range Status  07/19/2023 95 >59 mL/min/1.73 Final         Passed - Valid encounter within last 6 months    Recent Outpatient Visits           3 months ago Class 2 severe obesity due to excess calories with serious comorbidity and body mass index (BMI) of 37.0 to 37.9 in adult   [facility] Primary Care & Sports Medicine at [facility], Toribio SQUIBB, [place]   3 months ago Subacromial impingement, left   [facility] Primary Care & Sports Medicine at [facility], Selinda PARAS, MD   3 months ago Subacromial  impingement, left   [place] Primary Care & Sports Medicine at [facility], Selinda PARAS, MD   4 months ago Class 2 severe obesity due to excess calories with serious comorbidity and body mass index  (BMI) of 37.0 to 37.9 in adult   [facility] Primary Care & Sports Medicine at [facility], Toribio SQUIBB, [place]   6 months ago Class 2 severe obesity due to excess calories with serious comorbidity and body mass index (BMI) of 37.0 to 37.9 in adult   [facility] Primary Care & Sports Medicine at [facility], Toribio SQUIBB, [place]              Refused Prescriptions Disp Refills   clopidogrel  (PLAVIX ) 75 MG tablet [Pharmacy Med Name: CLOPIDOGREL  75 MG TABLET] 90 tablet 0    Sig: TAKE 1 TABLET BY MOUTH EVERY DAY     Hematology: Antiplatelets - clopidogrel  Failed - 02/12/2024 12:59 PM      Failed - HGB in normal range and within 180 days    Hemoglobin  Date Value Ref Range Status  01/10/2024 12.9 (L) 13.0 - 17.7 g/dL Final         Passed - HCT in normal range and within 180 days    Hematocrit  Date Value Ref Range Status  01/10/2024 41.7 37.5 - 51.0 % Final         Passed - PLT in normal range and within 180 days    Platelets  Date Value Ref Range Status  01/10/2024 276 150 - 450 x10E3/uL Final         Passed - Cr in normal range and within 360 days    Creatinine  Date Value Ref Range Status  09/19/2012 1.00 0.60 - 1.30 mg/dL Final   Creatinine, Ser  Date Value Ref Range Status  07/19/2023 0.96 0.76 - 1.27 mg/dL Final         Passed - Valid encounter within last 6 months    Recent Outpatient Visits           3 months ago Class 2 severe obesity due to excess calories with serious comorbidity and body mass index (BMI) of 37.0 to 37.9 in adult   [facility] Primary Care & Sports Medicine at [facility], Toribio SQUIBB, [place]   3 months ago Subacromial impingement, left   [place] [facility] Care & Sports Medicine at [facility], Selinda PARAS, MD   3 months ago Subacromial impingement, left   [place] [facility] Care & Sports Medicine at [facility], Selinda PARAS, MD   4 months ago Class 2 severe obesity due to excess calories with  serious comorbidity and body mass index (BMI) of 37.0 to 37.9 in adult   [facility] Primary Care & Sports Medicine at [facility], Toribio SQUIBB, [place]   6 months ago Class 2 severe obesity due to excess calories with serious comorbidity and body mass index (BMI) of 37.0 to 37.9 in adult   [facility] Primary Care & Sports Medicine at [facility], Toribio SQUIBB, [place]

## 2024-02-12 NOTE — Addendum Note (Signed)
 Addended by: BRIEN SALM on: 02/12/2024 01:40 PM   Modules accepted: Orders

## 2024-02-13 ENCOUNTER — Other Ambulatory Visit: Payer: Self-pay | Admitting: Physician Assistant

## 2024-02-13 DIAGNOSIS — Z6837 Body mass index (BMI) 37.0-37.9, adult: Secondary | ICD-10-CM

## 2024-02-17 NOTE — Telephone Encounter (Signed)
 Requested Prescriptions  Pending Prescriptions Disp Refills   WEGOVY  1.7 MG/0.75ML SOAJ SQ injection [Pharmacy Med Name: WEGOVY  1.7 MG/0.75 ML PEN] 3 mL 1    Sig: INJECT 1.7 MG INTO THE SKIN ONCE A WEEK.     Endocrinology:  Diabetes - GLP-1 Receptor Agonists - semaglutide  Failed - 02/17/2024 10:30 AM      Failed - HBA1C in normal range and within 180 days    Hgb A1c MFr Bld  Date Value Ref Range Status  11/12/2022 6.2 (H) 4.8 - 5.6 % Final    Comment:    (NOTE)         Prediabetes: 5.7 - 6.4         Diabetes: >6.4         Glycemic control for adults with diabetes: <7.0          Passed - Cr in normal range and within 360 days    Creatinine  Date Value Ref Range Status  09/19/2012 1.00 0.60 - 1.30 mg/dL Final   Creatinine, Ser  Date Value Ref Range Status  07/19/2023 0.96 0.76 - 1.27 mg/dL Final         Passed - Valid encounter within last 6 months    Recent Outpatient Visits           3 months ago Class 2 severe obesity due to excess calories with serious comorbidity and body mass index (BMI) of 37.0 to 37.9 in adult   [facility] Primary Care & Sports Medicine at [facility], Toribio SQUIBB, [place]   3 months ago Subacromial impingement, left   [place] [facility] Care & Sports Medicine at [facility], Selinda PARAS, [place]   4 months ago Subacromial impingement, left   [place] [facility] Care & Sports Medicine at [facility], Selinda PARAS, [place]   5 months ago Class 2 severe obesity due to excess calories with serious comorbidity and body mass index (BMI) of 37.0 to 37.9 in adult   [facility] Primary Care & Sports Medicine at [facility], Toribio SQUIBB, [place]   6 months ago Class 2 severe obesity due to excess calories with serious comorbidity and body mass index (BMI) of 37.0 to 37.9 in adult   [facility] Primary Care & Sports Medicine at [facility], Toribio SQUIBB, [place]
